# Patient Record
Sex: Female | Born: 1955 | Race: Black or African American | Hispanic: No | Smoking: Never smoker
Health system: Southern US, Community
[De-identification: ages and names within clinical notes are randomized; demographics above are authoritative.]

## PROBLEM LIST (undated history)

## (undated) DIAGNOSIS — N6012 Diffuse cystic mastopathy of left breast: Secondary | ICD-10-CM

## (undated) DIAGNOSIS — K219 Gastro-esophageal reflux disease without esophagitis: Secondary | ICD-10-CM

## (undated) DIAGNOSIS — N6011 Diffuse cystic mastopathy of right breast: Secondary | ICD-10-CM

## (undated) DIAGNOSIS — R748 Abnormal levels of other serum enzymes: Secondary | ICD-10-CM

## (undated) DIAGNOSIS — N951 Menopausal and female climacteric states: Secondary | ICD-10-CM

## (undated) DIAGNOSIS — D649 Anemia, unspecified: Secondary | ICD-10-CM

## (undated) DIAGNOSIS — R7303 Prediabetes: Secondary | ICD-10-CM

## (undated) DIAGNOSIS — M51369 Other intervertebral disc degeneration, lumbar region without mention of lumbar back pain or lower extremity pain: Secondary | ICD-10-CM

## (undated) DIAGNOSIS — J3489 Other specified disorders of nose and nasal sinuses: Secondary | ICD-10-CM

## (undated) DIAGNOSIS — I10 Essential (primary) hypertension: Secondary | ICD-10-CM

## (undated) DIAGNOSIS — G4700 Insomnia, unspecified: Secondary | ICD-10-CM

## (undated) DIAGNOSIS — G43909 Migraine, unspecified, not intractable, without status migrainosus: Secondary | ICD-10-CM

## (undated) DIAGNOSIS — D473 Essential (hemorrhagic) thrombocythemia: Secondary | ICD-10-CM

## (undated) DIAGNOSIS — M5136 Other intervertebral disc degeneration, lumbar region: Secondary | ICD-10-CM

## (undated) DIAGNOSIS — E785 Hyperlipidemia, unspecified: Secondary | ICD-10-CM

## (undated) DIAGNOSIS — M1990 Unspecified osteoarthritis, unspecified site: Secondary | ICD-10-CM

## (undated) DIAGNOSIS — Z972 Presence of dental prosthetic device (complete) (partial): Secondary | ICD-10-CM

## (undated) DIAGNOSIS — M479 Spondylosis, unspecified: Secondary | ICD-10-CM

## (undated) DIAGNOSIS — G4730 Sleep apnea, unspecified: Secondary | ICD-10-CM

## (undated) DIAGNOSIS — F339 Major depressive disorder, recurrent, unspecified: Secondary | ICD-10-CM

## (undated) DIAGNOSIS — I129 Hypertensive chronic kidney disease with stage 1 through stage 4 chronic kidney disease, or unspecified chronic kidney disease: Secondary | ICD-10-CM

## (undated) DIAGNOSIS — R7301 Impaired fasting glucose: Secondary | ICD-10-CM

## (undated) HISTORY — DX: Impaired fasting glucose: R73.01

## (undated) HISTORY — DX: Hyperlipidemia, unspecified: E78.5

## (undated) HISTORY — DX: Hypertensive chronic kidney disease with stage 1 through stage 4 chronic kidney disease, or unspecified chronic kidney disease: I12.9

## (undated) HISTORY — DX: Abnormal levels of other serum enzymes: R74.8

## (undated) HISTORY — DX: Spondylosis, unspecified: M47.9

## (undated) HISTORY — DX: Other intervertebral disc degeneration, lumbar region without mention of lumbar back pain or lower extremity pain: M51.369

## (undated) HISTORY — DX: Migraine, unspecified, not intractable, without status migrainosus: G43.909

## (undated) HISTORY — DX: Menopausal and female climacteric states: N95.1

## (undated) HISTORY — DX: Sleep apnea, unspecified: G47.30

## (undated) HISTORY — DX: Insomnia, unspecified: G47.00

## (undated) HISTORY — DX: Diffuse cystic mastopathy of left breast: N60.12

## (undated) HISTORY — PX: PITUITARY SURGERY: SHX203

## (undated) HISTORY — DX: Essential (hemorrhagic) thrombocythemia: D47.3

## (undated) HISTORY — DX: Diffuse cystic mastopathy of left breast: N60.11

## (undated) HISTORY — DX: Unspecified osteoarthritis, unspecified site: M19.90

## (undated) HISTORY — DX: Major depressive disorder, recurrent, unspecified: F33.9

## (undated) HISTORY — PX: ABDOMINAL HYSTERECTOMY: SHX81

## (undated) HISTORY — DX: Gastro-esophageal reflux disease without esophagitis: K21.9

## (undated) HISTORY — DX: Other intervertebral disc degeneration, lumbar region: M51.36

## (undated) HISTORY — DX: Other specified disorders of nose and nasal sinuses: J34.89

---

## 2001-05-13 HISTORY — PX: CHOLECYSTECTOMY: SHX55

## 2006-05-13 HISTORY — PX: COLONOSCOPY: SHX174

## 2006-08-12 ENCOUNTER — Ambulatory Visit: Payer: Self-pay | Admitting: Gastroenterology

## 2009-11-30 ENCOUNTER — Ambulatory Visit: Payer: Self-pay | Admitting: Family Medicine

## 2012-05-13 HISTORY — PX: KNEE SURGERY: SHX244

## 2012-07-27 ENCOUNTER — Ambulatory Visit: Payer: Self-pay | Admitting: Orthopedic Surgery

## 2013-05-13 HISTORY — PX: OTHER SURGICAL HISTORY: SHX169

## 2013-08-10 ENCOUNTER — Ambulatory Visit: Payer: Self-pay | Admitting: Family Medicine

## 2013-10-15 DIAGNOSIS — E220 Acromegaly and pituitary gigantism: Secondary | ICD-10-CM | POA: Insufficient documentation

## 2013-11-12 DIAGNOSIS — D352 Benign neoplasm of pituitary gland: Secondary | ICD-10-CM | POA: Insufficient documentation

## 2014-08-18 ENCOUNTER — Ambulatory Visit
Admit: 2014-08-18 | Disposition: A | Discharge status: Home / Self Care | Payer: Self-pay | Attending: Family Medicine | Admitting: Family Medicine

## 2014-08-22 ENCOUNTER — Encounter: Payer: Self-pay | Admitting: Family Medicine

## 2014-08-22 DIAGNOSIS — K219 Gastro-esophageal reflux disease without esophagitis: Secondary | ICD-10-CM | POA: Insufficient documentation

## 2014-08-22 DIAGNOSIS — I129 Hypertensive chronic kidney disease with stage 1 through stage 4 chronic kidney disease, or unspecified chronic kidney disease: Secondary | ICD-10-CM | POA: Insufficient documentation

## 2014-08-22 DIAGNOSIS — M1990 Unspecified osteoarthritis, unspecified site: Secondary | ICD-10-CM | POA: Insufficient documentation

## 2014-08-22 DIAGNOSIS — F339 Major depressive disorder, recurrent, unspecified: Secondary | ICD-10-CM | POA: Insufficient documentation

## 2014-08-22 DIAGNOSIS — G4730 Sleep apnea, unspecified: Secondary | ICD-10-CM | POA: Insufficient documentation

## 2014-08-22 DIAGNOSIS — G43909 Migraine, unspecified, not intractable, without status migrainosus: Secondary | ICD-10-CM | POA: Insufficient documentation

## 2014-08-22 DIAGNOSIS — E785 Hyperlipidemia, unspecified: Secondary | ICD-10-CM | POA: Insufficient documentation

## 2014-08-22 DIAGNOSIS — R7301 Impaired fasting glucose: Secondary | ICD-10-CM | POA: Insufficient documentation

## 2014-08-22 DIAGNOSIS — F329 Major depressive disorder, single episode, unspecified: Secondary | ICD-10-CM | POA: Insufficient documentation

## 2014-08-22 DIAGNOSIS — M5136 Other intervertebral disc degeneration, lumbar region: Secondary | ICD-10-CM | POA: Insufficient documentation

## 2014-08-22 DIAGNOSIS — F32A Depression, unspecified: Secondary | ICD-10-CM | POA: Insufficient documentation

## 2014-08-22 DIAGNOSIS — G4700 Insomnia, unspecified: Secondary | ICD-10-CM | POA: Insufficient documentation

## 2014-09-01 ENCOUNTER — Ambulatory Visit
Admit: 2014-09-01 | Disposition: A | Discharge status: Home / Self Care | Payer: Self-pay | Attending: Gastroenterology | Admitting: Gastroenterology

## 2014-11-16 ENCOUNTER — Encounter: Payer: Self-pay | Admitting: Family Medicine

## 2014-12-08 ENCOUNTER — Encounter: Payer: Self-pay | Admitting: Family Medicine

## 2015-02-16 ENCOUNTER — Encounter: Payer: Self-pay | Admitting: Family Medicine

## 2015-02-16 ENCOUNTER — Ambulatory Visit (INDEPENDENT_AMBULATORY_CARE_PROVIDER_SITE_OTHER): Payer: 59 | Admitting: Family Medicine

## 2015-02-16 VITALS — BP 137/88 | HR 71 | Temp 98.9°F | Ht 66.5 in | Wt 203.0 lb

## 2015-02-16 DIAGNOSIS — R7301 Impaired fasting glucose: Secondary | ICD-10-CM | POA: Diagnosis not present

## 2015-02-16 DIAGNOSIS — E669 Obesity, unspecified: Secondary | ICD-10-CM | POA: Diagnosis not present

## 2015-02-16 DIAGNOSIS — Z1239 Encounter for other screening for malignant neoplasm of breast: Secondary | ICD-10-CM

## 2015-02-16 DIAGNOSIS — Z1159 Encounter for screening for other viral diseases: Secondary | ICD-10-CM | POA: Diagnosis not present

## 2015-02-16 DIAGNOSIS — R05 Cough: Secondary | ICD-10-CM | POA: Diagnosis not present

## 2015-02-16 DIAGNOSIS — Z114 Encounter for screening for human immunodeficiency virus [HIV]: Secondary | ICD-10-CM

## 2015-02-16 DIAGNOSIS — I129 Hypertensive chronic kidney disease with stage 1 through stage 4 chronic kidney disease, or unspecified chronic kidney disease: Secondary | ICD-10-CM

## 2015-02-16 DIAGNOSIS — R059 Cough, unspecified: Secondary | ICD-10-CM

## 2015-02-16 DIAGNOSIS — M940 Chondrocostal junction syndrome [Tietze]: Secondary | ICD-10-CM | POA: Diagnosis not present

## 2015-02-16 DIAGNOSIS — Z0000 Encounter for general adult medical examination without abnormal findings: Secondary | ICD-10-CM | POA: Diagnosis not present

## 2015-02-16 MED ORDER — HYDROCHLOROTHIAZIDE 25 MG PO TABS: 25.0000 mg | ORAL_TABLET | Freq: Every day | ORAL | Status: DC

## 2015-02-16 NOTE — Patient Instructions (Addendum)
Stop the losartan / hctz combination pill Start plain hctz Monitor your blood pressure and call me if over 140-150 on top Try to follow the DASH guidelines  Check out the information at familydoctor.org entitled "What It Takes to Lose Weight" Try to lose between 1-2 pounds per week by taking in fewer calories and burning off more calories You can succeed by limiting portions, limiting foods dense in calories and fat, becoming more active, and drinking 8 glasses of water a day (64 ounces) Don't skip meals, especially breakfast, as skipping meals may alter your metabolism Do not use over-the-counter weight loss pills or gimmicks that claim rapid weight loss A healthy BMI (or body mass index) is between 18.5 and 24.9 You can calculate your ideal BMI at the [place] website [email or web address]  GINA--> please request colonoscopy report and any path report from [facility] from April 2016 and forward regarding colonoscopy and follow-up  Start 81 mg baby coated aspirin daily  Start 800 or 1000 iu vitamin D3 and take one a day on days of low sun exposure  We'll contact you about the lab results  Health Maintenance, Female Adopting a healthy lifestyle and getting preventive care can go a long way to promote health and wellness. Talk with your health care provider about what schedule of regular examinations is right for you. This is a good chance for you to check in with your provider about disease prevention and staying healthy. In between checkups, there are plenty of things you can do on your own. Experts have done a lot of research about which lifestyle changes and preventive measures are most likely to keep you healthy. Ask your health care provider for more information. WEIGHT AND DIET  Eat a healthy diet  Be sure to include plenty of vegetables, fruits, low-fat dairy products, and lean protein.  Do not eat a lot of foods high in solid fats, added  sugars, or salt.  Get regular exercise. This is one of the most important things you can do for your health.  Most adults should exercise for at least 150 minutes each week. The exercise should increase your heart rate and make you sweat (moderate-intensity exercise).  Most adults should also do strengthening exercises at least twice a week. This is in addition to the moderate-intensity exercise.  Maintain a healthy weight  Body mass index (BMI) is a measurement that can be used to identify possible weight problems. It estimates body fat based on height and weight. Your health care provider can help determine your BMI and help you achieve or maintain a healthy weight.  For females 95 years of age and older:   A BMI below 18.5 is considered underweight.  A BMI of 18.5 to 24.9 is normal.  A BMI of 25 to 29.9 is considered overweight.  A BMI of 30 and above is considered obese.  Watch levels of cholesterol and blood lipids  You should start having your blood tested for lipids and cholesterol at 60 years of age, then have this test every 5 years.  You may need to have your cholesterol levels checked more often if:  Your lipid or cholesterol levels are high.  You are older than 60 years of age.  You are at high risk for heart disease.  CANCER SCREENING   Lung Cancer  Lung cancer screening is recommended for adults 11-21 years old who are at high risk for lung cancer because of a history of smoking.  A yearly low-dose CT  scan of the lungs is recommended for people who:  Currently smoke.  Have quit within the past 15 years.  Have at least a 30-pack-year history of smoking. A pack year is smoking an average of one pack of cigarettes a day for 1 year.  Yearly screening should continue until it has been 15 years since you quit.  Yearly screening should stop if you develop a health problem that would prevent you from having lung cancer treatment.  Breast Cancer  Practice  breast self-awareness. This means understanding how your breasts normally appear and feel.  It also means doing regular breast self-exams. Let your health care provider know about any changes, no matter how small.  If you are in your 20s or 30s, you should have a clinical breast exam (CBE) by a health care provider every 1-3 years as part of a regular health exam.  If you are 79 or older, have a CBE every year. Also consider having a breast X-ray (mammogram) every year.  If you have a family history of breast cancer, talk to your health care provider about genetic screening.  If you are at high risk for breast cancer, talk to your health care provider about having an MRI and a mammogram every year.  Breast cancer gene (BRCA) assessment is recommended for women who have family members with BRCA-related cancers. BRCA-related cancers include:  Breast.  Ovarian.  Tubal.  Peritoneal cancers.  Results of the assessment will determine the need for genetic counseling and BRCA1 and BRCA2 testing. Cervical Cancer Your health care provider may recommend that you be screened regularly for cancer of the pelvic organs (ovaries, uterus, and vagina). This screening involves a pelvic examination, including checking for microscopic changes to the surface of your cervix (Pap test). You may be encouraged to have this screening done every 3 years, beginning at age 31.  For women ages 70-65, health care providers may recommend pelvic exams and Pap testing every 3 years, or they may recommend the Pap and pelvic exam, combined with testing for human papilloma virus (HPV), every 5 years. Some types of HPV increase your risk of cervical cancer. Testing for HPV may also be done on women of any age with unclear Pap test results.  Other health care providers may not recommend any screening for nonpregnant women who are considered low risk for pelvic cancer and who do not have symptoms. Ask your health care provider  if a screening pelvic exam is right for you.  If you have had past treatment for cervical cancer or a condition that could lead to cancer, you need Pap tests and screening for cancer for at least 20 years after your treatment. If Pap tests have been discontinued, your risk factors (such as having a new sexual partner) need to be reassessed to determine if screening should resume. Some women have medical problems that increase the chance of getting cervical cancer. In these cases, your health care provider may recommend more frequent screening and Pap tests. Colorectal Cancer  This type of cancer can be detected and often prevented.  Routine colorectal cancer screening usually begins at 60 years of age and continues through 60 years of age.  Your health care provider may recommend screening at an earlier age if you have risk factors for colon cancer.  Your health care provider may also recommend using home test kits to check for hidden blood in the stool.  A small camera at the end of a tube can be used  to examine your colon directly (sigmoidoscopy or colonoscopy). This is done to check for the earliest forms of colorectal cancer.  Routine screening usually begins at age 55.  Direct examination of the colon should be repeated every 5-10 years through 60 years of age. However, you may need to be screened more often if early forms of precancerous polyps or small growths are found. Skin Cancer  Check your skin from head to toe regularly.  Tell your health care provider about any new moles or changes in moles, especially if there is a change in a mole's shape or color.  Also tell your health care provider if you have a mole that is larger than the size of a pencil eraser.  Always use sunscreen. Apply sunscreen liberally and repeatedly throughout the day.  Protect yourself by wearing long sleeves, pants, a wide-brimmed hat, and sunglasses whenever you are outside. HEART DISEASE, DIABETES, AND  HIGH BLOOD PRESSURE   High blood pressure causes heart disease and increases the risk of stroke. High blood pressure is more likely to develop in:  People who have blood pressure in the high end of the normal range (130-139/85-89 mm Hg).  People who are overweight or obese.  People who are African American.  If you are 67-70 years of age, have your blood pressure checked every 3-5 years. If you are 105 years of age or older, have your blood pressure checked every year. You should have your blood pressure measured twice--once when you are at a hospital or clinic, and once when you are not at a hospital or clinic. Record the average of the two measurements. To check your blood pressure when you are not at a hospital or clinic, you can use:  An automated blood pressure machine at a pharmacy.  A home blood pressure monitor.  If you are between 90 years and 77 years old, ask your health care provider if you should take aspirin to prevent strokes.  Have regular diabetes screenings. This involves taking a blood sample to check your fasting blood sugar level.  If you are at a normal weight and have a low risk for diabetes, have this test once every three years after 60 years of age.  If you are overweight and have a high risk for diabetes, consider being tested at a younger age or more often. PREVENTING INFECTION  Hepatitis B  If you have a higher risk for hepatitis B, you should be screened for this virus. You are considered at high risk for hepatitis B if:  You were born in a country where hepatitis B is common. Ask your health care provider which countries are considered high risk.  Your parents were born in a high-risk country, and you have not been immunized against hepatitis B (hepatitis B vaccine).  You have HIV or AIDS.  You use needles to inject street drugs.  You live with someone who has hepatitis B.  You have had sex with someone who has hepatitis B.  You get hemodialysis  treatment.  You take certain medicines for conditions, including cancer, organ transplantation, and autoimmune conditions. Hepatitis C  Blood testing is recommended for:  Everyone born from [birth year] through 1966.  Anyone with known risk factors for hepatitis C. Sexually transmitted infections (STIs)  You should be screened for sexually transmitted infections (STIs) including gonorrhea and chlamydia if:  You are sexually active and are younger than 60 years of age.  You are older than 60 years of age and your  health care provider tells you that you are at risk for this type of infection.  Your sexual activity has changed since you were last screened and you are at an increased risk for chlamydia or gonorrhea. Ask your health care provider if you are at risk.  If you do not have HIV, but are at risk, it may be recommended that you take a prescription medicine daily to prevent HIV infection. This is called pre-exposure prophylaxis (PrEP). You are considered at risk if:  You are sexually active and do not regularly use condoms or know the HIV status of your partner(s).  You take drugs by injection.  You are sexually active with a partner who has HIV. Talk with your health care provider about whether you are at high risk of being infected with HIV. If you choose to begin PrEP, you should first be tested for HIV. You should then be tested every 3 months for as long as you are taking PrEP.  PREGNANCY   If you are premenopausal and you may become pregnant, ask your health care provider about preconception counseling.  If you may become pregnant, take 400 to 800 micrograms (mcg) of folic acid every day.  If you want to prevent pregnancy, talk to your health care provider about birth control (contraception). OSTEOPOROSIS AND MENOPAUSE   Osteoporosis is a disease in which the bones lose minerals and strength with aging. This can result in serious bone fractures. Your risk for osteoporosis can  be identified using a bone density scan.  If you are 58 years of age or older, or if you are at risk for osteoporosis and fractures, ask your health care provider if you should be screened.  Ask your health care provider whether you should take a calcium or vitamin D supplement to lower your risk for osteoporosis.  Menopause may have certain physical symptoms and risks.  Hormone replacement therapy may reduce some of these symptoms and risks. Talk to your health care provider about whether hormone replacement therapy is right for you.  HOME CARE INSTRUCTIONS   Schedule regular health, dental, and eye exams.  Stay current with your immunizations.   Do not use any tobacco products including cigarettes, chewing tobacco, or electronic cigarettes.  If you are pregnant, do not drink alcohol.  If you are breastfeeding, limit how much and how often you drink alcohol.  Limit alcohol intake to no more than 1 drink per day for nonpregnant women. One drink equals 12 ounces of beer, 5 ounces of wine, or 1 ounces of hard liquor.  Do not use street drugs.  Do not share needles.  Ask your health care provider for help if you need support or information about quitting drugs.  Tell your health care provider if you often feel depressed.  Tell your health care provider if you have ever been abused or do not feel safe at home.   This information is not intended to replace advice given to you by your health care provider. Make sure you discuss any questions you have with your health care provider.   Document Released: 11/12/2010 Document Revised: 05/20/2014 Document Reviewed: 03/31/2013 Elsevier Interactive Patient Education 2016 [employer] Costochondritis Costochondritis, sometimes called Tietze syndrome, is a swelling and irritation (inflammation) of the tissue (cartilage) that connects your ribs with your breastbone (sternum). It causes pain in the chest and rib area. Costochondritis  usually goes away on its own over time. It can take up to 6 weeks or longer to get  better, especially if you are unable to limit your activities. CAUSES  Some cases of costochondritis have no known cause. Possible causes include:  Injury (trauma).  Exercise or activity such as lifting.  Severe coughing. SIGNS AND SYMPTOMS  Pain and tenderness in the chest and rib area.  Pain that gets worse when coughing or taking deep breaths.  Pain that gets worse with specific movements. DIAGNOSIS  Your health care provider will do a physical exam and ask about your symptoms. Chest X-rays or other tests may be done to rule out other problems. TREATMENT  Costochondritis usually goes away on its own over time. Your health care provider may prescribe medicine to help relieve pain. HOME CARE INSTRUCTIONS   Avoid exhausting physical activity. Try not to strain your ribs during normal activity. This would include any activities using chest, abdominal, and side muscles, especially if heavy weights are used.  Apply ice to the affected area for the first 2 days after the pain begins.  Put ice in a plastic bag.  Place a towel between your skin and the bag.  Leave the ice on for 20 minutes, 2-3 times a day.  Only take over-the-counter or prescription medicines as directed by your health care provider. SEEK MEDICAL CARE IF:  You have redness or swelling at the rib joints. These are signs of infection.  Your pain does not go away despite rest or medicine. SEEK IMMEDIATE MEDICAL CARE IF:   Your pain increases or you are very uncomfortable.  You have shortness of breath or difficulty breathing.  You cough up blood.  You have worse chest pains, sweating, or vomiting.  You have a fever or persistent symptoms for more than 2-3 days.  You have a fever and your symptoms suddenly get worse. MAKE SURE YOU:   Understand these instructions.  Will watch your condition.  Will get help right away if  you are not doing well or get worse.   This information is not intended to replace advice given to you by your health care provider. Make sure you discuss any questions you have with your health care provider.   Document Released: 02/06/2005 Document Revised: 02/17/2013 Document Reviewed: 12/01/2012 Elsevier Interactive Patient Education [employer].

## 2015-02-16 NOTE — Assessment & Plan Note (Addendum)
On metformin per endocrinologist; encouraged weight loss

## 2015-02-16 NOTE — Progress Notes (Signed)
Patient ID: [name], female   DOB: 1956-01-22, 59 y.o.   MRN: [id number]   Subjective:   [name] is a 59 y.o. female here for a complete physical exam  Interim issues since last visit: no surgeries, no ER, no broken bones  USPSTF grade a and b recommendations Alcohol: very seldom Depression screen PHQ 2/9 02/16/2015  Decreased Interest 0  Down, Depressed, Hopeless 0  PHQ - 2 Score 0  HTN: well-controlled, goes up even at the doctor; usually in the good numbers; she wants to change BP med; she would skip a few days and the cough would stop; felt chest pain, fleeting middle of the chest; no nausea, no SHOB Obesity: lost 6 pounds, working on getting down to 180 pounds Tobacco: around 2nd hand smoke; no personal smoking hx HIV, Hep B, Hep C: test today Chol: today Glucose: today Abuse: no BRCA: n/a Breast: one grandmother; does mammo every 2 years Cervix: n/a Colorectal cancer: UTD, April 2016 Osteoporosis: no steroids; no early fam hx of osteoporosis; going to see doctor at [facility] Aspirin: not taking, but will start that Diet: some vegetables, salads Exercise: will start walking  Past Medical History  Diagnosis Date  . Fibrocystic disease of both breasts   . GERD (gastroesophageal reflux disease)   . Osteoarthritis   . Perforation of nasal septum   . Benign hypertensive renal disease   . Hyperlipidemia   . DDD (degenerative disc disease), lumbar   . Insomnia   . Impaired fasting glucose   . Recurrent major depression ([place])   . Menopausal state   . Migraine   . 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 Elevated alkaline phosphatase level   . Sleep apnea     Does not use CPAP  . Spondylosis    Past Surgical History  Procedure Laterality Date  . Pituitary surgery      x2 30+ years ago  . Cholecystectomy  2003  . Abdominal hysterectomy      partial with bladder suspension, no cervix  . Knee surgery Right 2014  . Transnasal approach pituitary adenoma  resection  2015    clival/parasellar mass  . Colonoscopy  2008    1 polyp- return 10 years   Family History  Problem Relation Age of Onset  . Diabetes Mother   . Hypertension Mother   . Cancer Mother     possible liver  . Heart disease Father   . Hypertension Father   . Heart attack Father   . Alzheimer's disease Father   . Diabetes Sister   . Hypertension Sister   . Cancer Sister     lung?  . Stroke Sister   . Atrial fibrillation Sister    Social History  Substance Use Topics  . Smoking status: Never Smoker   . Smokeless tobacco: Never Used  . Alcohol Use: No     Comment: rare   Review of Systems  Constitutional: Negative for unexpected weight change.  HENT: Positive for ear pain (left side, clicks with heart beat). Negative for hearing loss and nosebleeds.   Eyes: Negative for visual disturbance.  Respiratory: Positive for cough (thought to be 2nd to ARB; no hemoptysis, no sputum).   Cardiovascular: Positive for chest pain (with cough central).  Gastrointestinal: Negative for blood in stool.  Endocrine: Negative for cold intolerance, heat intolerance, polydipsia and polyuria.  Genitourinary: Negative for hematuria.  Musculoskeletal: Positive for arthralgias.  Allergic/Immunologic: Negative for food allergies.  Neurological: Negative  for tremors.  Hematological: Positive for adenopathy (some neck lymph nodes, better now). Does not bruise/bleed easily.  Psychiatric/Behavioral: Negative for dysphoric mood and decreased concentration.   Objective:   Filed Vitals:   02/16/15 1406  BP: 137/88  Pulse: 71  Temp: 98.9 F (37.2 C)  Height: 5' 6.5" (1.689 m)  Weight: 203 lb (92.08 kg)  SpO2: 98%   Body mass index is 32.28 kg/(m^2). Wt Readings from Last 3 Encounters:  02/16/15 203 lb (92.08 kg)  08/18/14 209 lb (94.802 kg)   Physical Exam  Constitutional: She appears well-developed and well-nourished.  HENT:  Head: Normocephalic and atraumatic.  Right Ear:  Hearing, tympanic membrane, external ear and ear canal normal.  Left Ear: Hearing, tympanic membrane, external ear and ear canal normal.  Eyes: Conjunctivae and EOM are normal. Right eye exhibits no hordeolum. Left eye exhibits no hordeolum. No scleral icterus.  Neck: Carotid bruit is not present. No thyromegaly present.  Cardiovascular: Normal rate, regular rhythm, S1 normal, S2 normal and normal heart sounds.   No extrasystoles are present.  Pulmonary/Chest: Effort normal and breath sounds normal. No respiratory distress. She exhibits tenderness (tender over anterior costochondral junctions at several levels). She exhibits no crepitus, no edema and no deformity. Right breast exhibits no inverted nipple, no mass, no nipple discharge, no skin change and no tenderness. Left breast exhibits no inverted nipple, no mass, no nipple discharge, no skin change and no tenderness. Breasts are symmetrical.  Abdominal: Soft. Normal appearance and bowel sounds are normal. She exhibits no distension, no abdominal bruit, no pulsatile midline mass and no mass. There is no hepatosplenomegaly. There is no tenderness. No hernia.  Musculoskeletal: Normal range of motion. She exhibits no edema.  Lymphadenopathy:       Head (right side): No submandibular adenopathy present.       Head (left side): No submandibular adenopathy present.    She has no cervical adenopathy.    She has no axillary adenopathy.  Neurological: She is alert. She displays no tremor. No cranial nerve deficit. She exhibits normal muscle tone. Gait normal.  Reflex Scores:      Patellar reflexes are 2+ on the right side and 2+ on the left side. Skin: Skin is warm and dry. No bruising and no ecchymosis noted. No cyanosis. No pallor.  Psychiatric: Her speech is normal and behavior is normal. Thought content normal. Her mood appears not anxious. She does not exhibit a depressed mood.   Assessment/Plan:   Problem List Items Addressed This Visit       Endocrine   Impaired fasting glucose    On metformin per endocrinologist; encouraged weight loss        Genitourinary   Benign hypertensive renal disease    Stop the ARB because of possible side effect (cough) and use plain thiazide; DASH guidelines; work on modest weight loss        Other   Breast cancer screening    Mammograms every 1-2 years; CBE done today; recommended monthly SBE      Relevant Orders   MM DIGITAL SCREENING BILATERAL   Preventative health care - Primary    USPSTF grade A and B recommendations reviewed with patient; age-appropriate recommendations, preventive care, screening tests, etc discussed and encouraged; healthy living encouraged; see AVS for patient education given to patient; requesting colonoscopy and path reports to see when next colonoscopy is due      Relevant Orders   CBC with Differential/Platelet (Completed)   Lipid Panel  w/o Chol/HDL Ratio (Completed)   Comprehensive metabolic panel (Completed)   Obesity    Encouraged weight loss; see AVS for information given to patient      Relevant Medications   metFORMIN (GLUCOPHAGE) 500 MG tablet    Other Visit Diagnoses    Costochondritis        explained dx; see AVS for info given to patient    Cough        may be side effect of the ARB (not nearly as common as with ACE-I, but that was her reaction to the ACE-I); if not improving, contact me    Screening for HIV (human immunodeficiency virus)        one time screening recommendation discussed, patient agrees, ordered    Relevant Orders    HIV antibody (Completed)    Need for hepatitis C screening test        one time screening recommendation discussed, patient agrees, ordered    Relevant Orders    Hepatitis C antibody (Completed)    Need for hepatitis B screening test        one time screening recommendation discussed, patient agrees, ordered    Relevant Orders    Hepatitis B surface antigen (Completed)       Meds ordered this encounter   Medications  . metFORMIN (GLUCOPHAGE) 500 MG tablet    Sig: Take 500 mg by mouth 2 (two) times daily.    Refill:  3  . hydrochlorothiazide (HYDRODIURIL) 25 MG tablet    Sig: Take 1 tablet (25 mg total) by mouth daily.    Dispense:  30 tablet    Refill:  5    CANCEL the losartan/hctz   Orders Placed This Encounter  Procedures  . MM DIGITAL SCREENING BILATERAL  . Hepatitis B surface antigen  . Hepatitis C antibody  . HIV antibody  . CBC with Differential/Platelet  . Lipid Panel w/o Chol/HDL Ratio  . Comprehensive metabolic panel   Follow up plan: Return in about 1 year (around 02/16/2016) for next physical. An after-visit summary was printed and given to the patient at [facility].  Please see the patient instructions which may contain other information and recommendations beyond what is mentioned above in the assessment and plan.

## 2015-02-17 LAB — CBC WITH DIFFERENTIAL/PLATELET
Basophils Absolute: 0 10*3/uL (ref 0.0–0.2)
Basos: 0 %
EOS (ABSOLUTE): 0.1 10*3/uL (ref 0.0–0.4)
Eos: 1 %
Hematocrit: 36.4 % (ref 34.0–46.6)
Hemoglobin: 11.3 g/dL (ref 11.1–15.9)
Immature Grans (Abs): 0 10*3/uL (ref 0.0–0.1)
Immature Granulocytes: 0 %
Lymphocytes Absolute: 1.6 10*3/uL (ref 0.7–3.1)
Lymphs: 29 %
MCH: 26.6 pg (ref 26.6–33.0)
MCHC: 31 g/dL — ABNORMAL LOW (ref 31.5–35.7)
MCV: 86 fL (ref 79–97)
Monocytes Absolute: 0.5 10*3/uL (ref 0.1–0.9)
Monocytes: 10 %
Neutrophils Absolute: 3.3 10*3/uL (ref 1.4–7.0)
Neutrophils: 60 %
Platelets: 398 10*3/uL — ABNORMAL HIGH (ref 150–379)
RBC: 4.25 x10E6/uL (ref 3.77–5.28)
RDW: 15.8 % — ABNORMAL HIGH (ref 12.3–15.4)
WBC: 5.5 10*3/uL (ref 3.4–10.8)

## 2015-02-17 LAB — COMPREHENSIVE METABOLIC PANEL
ALT: 16 IU/L (ref 0–32)
AST: 16 IU/L (ref 0–40)
Albumin/Globulin Ratio: 1.3 (ref 1.1–2.5)
Albumin: 3.9 g/dL (ref 3.6–4.8)
Alkaline Phosphatase: 126 IU/L — ABNORMAL HIGH (ref 39–117)
BUN/Creatinine Ratio: 12 (ref 11–26)
BUN: 11 mg/dL (ref 8–27)
Bilirubin Total: 0.3 mg/dL (ref 0.0–1.2)
CO2: 26 mmol/L (ref 18–29)
Calcium: 9.4 mg/dL (ref 8.7–10.3)
Chloride: 103 mmol/L (ref 97–108)
Creatinine, Ser: 0.95 mg/dL (ref 0.57–1.00)
GFR calc Af Amer: 75 mL/min/{1.73_m2} (ref >59)
GFR calc non Af Amer: 65 mL/min/{1.73_m2} (ref >59)
Globulin, Total: 2.9 g/dL (ref 1.5–4.5)
Glucose: 95 mg/dL (ref 65–99)
Potassium: 4.3 mmol/L (ref 3.5–5.2)
Sodium: 142 mmol/L (ref 134–144)
Total Protein: 6.8 g/dL (ref 6.0–8.5)

## 2015-02-17 LAB — LIPID PANEL W/O CHOL/HDL RATIO
Cholesterol, Total: 178 mg/dL (ref 100–199)
HDL: 49 mg/dL (ref >39)
LDL Calculated: 114 mg/dL — ABNORMAL HIGH (ref 0–99)
Triglycerides: 75 mg/dL (ref 0–149)
VLDL Cholesterol Cal: 15 mg/dL (ref 5–40)

## 2015-02-17 LAB — HEPATITIS B SURFACE ANTIGEN: Hepatitis B Surface Ag: NEGATIVE

## 2015-02-17 LAB — HIV ANTIBODY (ROUTINE TESTING W REFLEX): HIV Screen 4th Generation wRfx: NONREACTIVE

## 2015-02-17 LAB — HEPATITIS C ANTIBODY: Hep C Virus Ab: <0.1 s/co ratio (ref 0.0–0.9)

## 2015-02-21 DIAGNOSIS — E669 Obesity, unspecified: Secondary | ICD-10-CM

## 2015-02-21 DIAGNOSIS — Z0000 Encounter for general adult medical examination without abnormal findings: Principal | ICD-10-CM

## 2015-02-21 NOTE — Assessment & Plan Note (Signed)
USPSTF grade A and B recommendations reviewed with patient; age-appropriate recommendations, preventive care, screening tests, etc discussed and encouraged; healthy living encouraged; see AVS for patient education given to patient; requesting colonoscopy and path reports to see when next colonoscopy is due

## 2015-02-21 NOTE — Assessment & Plan Note (Signed)
Encouraged weight loss; see AVS for information given to patient

## 2015-02-21 NOTE — Assessment & Plan Note (Signed)
Mammograms every 1-2 years; CBE done today; recommended monthly SBE

## 2015-02-21 NOTE — Assessment & Plan Note (Signed)
Stop the ARB because of possible side effect (cough) and use plain thiazide; DASH guidelines; work on modest weight loss

## 2015-02-23 ENCOUNTER — Ambulatory Visit
Admission: RE | Admit: 2015-02-23 | Discharge: 2015-02-23 | Disposition: A | Discharge status: Home / Self Care | Payer: 59 | Source: Ambulatory Visit | Attending: Family Medicine | Admitting: Family Medicine

## 2015-02-23 DIAGNOSIS — Z1231 Encounter for screening mammogram for malignant neoplasm of breast: Secondary | ICD-10-CM | POA: Insufficient documentation

## 2015-02-23 DIAGNOSIS — Z1239 Encounter for other screening for malignant neoplasm of breast: Secondary | ICD-10-CM

## 2015-02-27 ENCOUNTER — Telehealth: Payer: Self-pay | Admitting: Family Medicine

## 2015-02-27 NOTE — Telephone Encounter (Signed)
No voicemail set up; could not leave msg

## 2015-03-07 ENCOUNTER — Telehealth: Payer: Self-pay

## 2015-03-07 NOTE — Telephone Encounter (Signed)
She is still having a problem with her right ear, she is still having slight pain and thinks something needs to be called in. Also she got her mammo report and it said that she had dense breasts that may obscure small masses and she wanted clarification on that.

## 2015-03-08 MED ORDER — FLUCONAZOLE 150 MG PO TABS: 150.0000 mg | ORAL_TABLET | Freq: Once | ORAL | Status: DC

## 2015-03-08 MED ORDER — ANTIPYRINE-BENZOCAINE 5.4-1.4 % OT SOLN: 3.0000 [drp] | OTIC | Status: DC | PRN

## 2015-03-08 MED ORDER — AMOXICILLIN 875 MG PO TABS: 875.0000 mg | ORAL_TABLET | Freq: Two times a day (BID) | ORAL | Status: DC

## 2015-03-08 NOTE — Telephone Encounter (Signed)
Patient notified, she also states she missed a call from you in regards to her lab results. While I was on the phone with her, I was notified that the pharmacy was on a different line and that the drops are not covered, they want to know if you can change to something else.

## 2015-03-08 NOTE — Telephone Encounter (Signed)
See other phone note; I talked to patient

## 2015-03-08 NOTE — Telephone Encounter (Signed)
I spoke with the patient; she says she has real sharp pains on the right, clicking; not sure if sinus infection; pressure in the nose; no fevers; pressure behind the cheek and eye; blowing out some stuff and little bit of blood; she has had these before and thinks it is a sinus infection Rx for amoxicillin; she gets yeast infections sometimes too; Rx sent dicsussed lab results

## 2015-03-08 NOTE — Telephone Encounter (Signed)
I sent in new Rx for drops which hopefully will help; if problem persists or worsens over next week, call back and we'll refer her to ENT Let her know dense breasts are common, simply means that mammograms are not quite as sensitive in picking up very small changes in tissue when breasts are dense versus when they are really fatty

## 2015-03-28 ENCOUNTER — Ambulatory Visit (INDEPENDENT_AMBULATORY_CARE_PROVIDER_SITE_OTHER): Payer: 59 | Admitting: Family Medicine

## 2015-03-28 ENCOUNTER — Encounter: Payer: Self-pay | Admitting: Family Medicine

## 2015-03-28 VITALS — BP 161/90 | HR 73 | Temp 98.7°F | Ht 66.3 in | Wt 205.0 lb

## 2015-03-28 DIAGNOSIS — Z23 Encounter for immunization: Secondary | ICD-10-CM

## 2015-03-28 DIAGNOSIS — T148 Other injury of unspecified body region: Secondary | ICD-10-CM

## 2015-03-28 DIAGNOSIS — W57XXXA Bitten or stung by nonvenomous insect and other nonvenomous arthropods, initial encounter: Secondary | ICD-10-CM | POA: Insufficient documentation

## 2015-03-28 MED ORDER — CEPHALEXIN 500 MG PO CAPS: 500.0000 mg | ORAL_CAPSULE | Freq: Three times a day (TID) | ORAL | Status: DC

## 2015-03-28 NOTE — Assessment & Plan Note (Signed)
Patient with allergic reaction and secondary cellulitis Discussed extensively care and treatment Gave printed information on treatment Patient was exposed in a motel room but discussed prevention of outbreaks at home and gave printed material. We'll give tetanus also excoriated areas

## 2015-03-28 NOTE — Progress Notes (Signed)
BP 161/90 mmHg  Pulse 73  Temp(Src) 98.7 F (37.1 C)  Ht 5' 6.3" (1.684 m)  Wt 205 lb (92.987 kg)  BMI 32.79 kg/m2  SpO2 99%   Subjective:    Patient ID: [name], female    DOB: 11-27-1955, 59 y.o.   MRN: [id number]  HPI: [name] is a 59 y.o. female  Chief Complaint  Patient presents with  . bed bug bites on face   Patient was placed on prednisone from walk-in clinic Still has marked swelling of her ear and redness in bite places around her right ear and scalp with secondary infection Patient doing some better on prednisone Patient still somewhat feverish but with no nausea vomiting or diarrhea. Family member who is with her with no infection symptoms are allergic reaction  Relevant past medical, surgical, family and social history reviewed and updated as indicated. Interim medical history since our last visit reviewed. Allergies and medications reviewed and updated.  Review of Systems  Constitutional: Negative.   HENT: Positive for facial swelling.   Respiratory: Negative.        Doing some coughing  Cardiovascular: Negative.     Per HPI unless specifically indicated above     Objective:    BP 161/90 mmHg  Pulse 73  Temp(Src) 98.7 F (37.1 C)  Ht 5' 6.3" (1.684 m)  Wt 205 lb (92.987 kg)  BMI 32.79 kg/m2  SpO2 99%  Wt Readings from Last 3 Encounters:  03/28/15 205 lb (92.987 kg)  02/16/15 203 lb (92.08 kg)  08/18/14 209 lb (94.802 kg)    Physical Exam  Constitutional: She is oriented to person, place, and time. She appears well-developed and well-nourished. No distress.  HENT:  Head: Normocephalic and atraumatic.  Right Ear: Hearing normal.  Left Ear: Hearing normal.  Nose: Nose normal.  Eyes: Conjunctivae and lids are normal. Right eye exhibits no discharge. Left eye exhibits no discharge. No scleral icterus.  Cardiovascular: Normal rate, regular rhythm and normal heart sounds.   Pulmonary/Chest: Effort normal and breath sounds  normal. No respiratory distress.  Musculoskeletal: Normal range of motion.  Neurological: She is alert and oriented to person, place, and time.  Skin: Skin is intact. No rash noted.  Inflamed area of right ear with swelling in several areas on scalp with secondary infection  Psychiatric: She has a normal mood and affect. Her speech is normal and behavior is normal. Judgment and thought content normal. Cognition and memory are normal.    Results for orders placed or performed in visit on 02/16/15  Hepatitis B surface antigen  Result Value Ref Range   Hepatitis B Surface Ag Negative Negative  Hepatitis C antibody  Result Value Ref Range   Hep C Virus Ab <0.1 0.0 - 0.9 s/co ratio  HIV antibody  Result Value Ref Range   HIV Screen 4th Generation wRfx Non Reactive Non Reactive  CBC with Differential/Platelet  Result Value Ref Range   WBC 5.5 3.4 - 10.8 x10E3/uL   RBC 4.25 3.77 - 5.28 x10E6/uL   Hemoglobin 11.3 11.1 - 15.9 g/dL   Hematocrit 36.4 34.0 - 46.6 %   MCV 86 79 - 97 fL   MCH 26.6 26.6 - 33.0 pg   MCHC 31.0 (L) 31.5 - 35.7 g/dL   RDW 15.8 (H) 12.3 - 15.4 %   Platelets 398 (H) 150 - 379 x10E3/uL   Neutrophils 60 %   Lymphs 29 %   Monocytes 10 %  Eos 1 %   Basos 0 %   Neutrophils Absolute 3.3 1.4 - 7.0 x10E3/uL   Lymphocytes Absolute 1.6 0.7 - 3.1 x10E3/uL   Monocytes Absolute 0.5 0.1 - 0.9 x10E3/uL   EOS (ABSOLUTE) 0.1 0.0 - 0.4 x10E3/uL   Basophils Absolute 0.0 0.0 - 0.2 x10E3/uL   Immature Granulocytes 0 %   Immature Grans (Abs) 0.0 0.0 - 0.1 x10E3/uL  Lipid Panel w/o Chol/HDL Ratio  Result Value Ref Range   Cholesterol, Total 178 100 - 199 mg/dL   Triglycerides 75 0 - 149 mg/dL   HDL 49 >39 mg/dL   VLDL Cholesterol Cal 15 5 - 40 mg/dL   LDL Calculated 114 (H) 0 - 99 mg/dL  Comprehensive metabolic panel  Result Value Ref Range   Glucose 95 65 - 99 mg/dL   BUN 11 8 - 27 mg/dL   Creatinine, Ser 0.95 0.57 - 1.00 mg/dL   GFR calc non Af Amer 65 >59 mL/min/1.73    GFR calc Af Amer 75 >59 mL/min/1.73   BUN/Creatinine Ratio 12 11 - 26   Sodium 142 134 - 144 mmol/L   Potassium 4.3 3.5 - 5.2 mmol/L   Chloride 103 97 - 108 mmol/L   CO2 26 18 - 29 mmol/L   Calcium 9.4 8.7 - 10.3 mg/dL   Total Protein 6.8 6.0 - 8.5 g/dL   Albumin 3.9 3.6 - 4.8 g/dL   Globulin, Total 2.9 1.5 - 4.5 g/dL   Albumin/Globulin Ratio 1.3 1.1 - 2.5   Bilirubin Total 0.3 0.0 - 1.2 mg/dL   Alkaline Phosphatase 126 (H) 39 - 117 IU/L   AST 16 0 - 40 IU/L   ALT 16 0 - 32 IU/L      Assessment & Plan:   Problem List Items Addressed This Visit      Other   Bed bug bite    Patient with allergic reaction and secondary cellulitis Discussed extensively care and treatment Gave printed information on treatment Patient was exposed in a motel room but discussed prevention of outbreaks at home and gave printed material. We'll give tetanus also excoriated areas      Relevant Medications   methylPREDNISolone (MEDROL DOSEPAK) 4 MG TBPK tablet   triamcinolone acetonide 40 MG/ML SUSP 40 mg, mupirocin cream 2 % CREA 15 g   cephALEXin (KEFLEX) 500 MG capsule   Other Relevant Orders   Tdap vaccine greater than or equal to 7yo IM (Completed)    Other Visit Diagnoses    Immunization due    -  Primary    Relevant Orders    Tdap vaccine greater than or equal to 7yo IM (Completed)        Follow up plan: Return for As scheduled.

## 2015-04-03 ENCOUNTER — Encounter: Payer: Self-pay | Admitting: Family Medicine

## 2015-04-03 ENCOUNTER — Other Ambulatory Visit: Payer: Self-pay | Admitting: Family Medicine

## 2015-04-03 ENCOUNTER — Telehealth: Payer: Self-pay | Admitting: Family Medicine

## 2015-04-03 NOTE — Telephone Encounter (Signed)
Pt called and stated that she needed a note with the diagnosis on it to send to the hotel to let them know what was going on with her face before they will take ownership of the problem.

## 2015-04-03 NOTE — Telephone Encounter (Signed)
Letter done

## 2015-04-03 NOTE — Telephone Encounter (Signed)
Can you write a letter stating Bed Bug Dx for patient to send to hotel

## 2015-05-31 ENCOUNTER — Ambulatory Visit: Payer: 59 | Admitting: Family Medicine

## 2015-08-21 ENCOUNTER — Other Ambulatory Visit: Payer: Self-pay

## 2015-08-21 MED ORDER — HYDROCHLOROTHIAZIDE 25 MG PO TABS: 25.0000 mg | ORAL_TABLET | Freq: Every day | ORAL | Status: DC

## 2015-08-21 NOTE — Telephone Encounter (Signed)
Routing to provider. She is following you to [facility]. 

## 2015-08-21 NOTE — Telephone Encounter (Signed)
Rx approved; last Cr reviewed

## 2015-08-25 ENCOUNTER — Ambulatory Visit (INDEPENDENT_AMBULATORY_CARE_PROVIDER_SITE_OTHER): Payer: 59 | Admitting: Family Medicine

## 2015-08-25 ENCOUNTER — Encounter: Payer: Self-pay | Admitting: Family Medicine

## 2015-08-25 ENCOUNTER — Telehealth: Payer: Self-pay

## 2015-08-25 VITALS — BP 132/84 | HR 66 | Temp 97.8°F | Resp 16 | Wt 212.0 lb

## 2015-08-25 DIAGNOSIS — G4700 Insomnia, unspecified: Secondary | ICD-10-CM | POA: Diagnosis not present

## 2015-08-25 DIAGNOSIS — R42 Dizziness and giddiness: Secondary | ICD-10-CM

## 2015-08-25 DIAGNOSIS — M545 Low back pain, unspecified: Secondary | ICD-10-CM

## 2015-08-25 DIAGNOSIS — H6982 Other specified disorders of Eustachian tube, left ear: Secondary | ICD-10-CM | POA: Diagnosis not present

## 2015-08-25 DIAGNOSIS — M5136 Other intervertebral disc degeneration, lumbar region: Secondary | ICD-10-CM | POA: Diagnosis not present

## 2015-08-25 DIAGNOSIS — J0100 Acute maxillary sinusitis, unspecified: Secondary | ICD-10-CM

## 2015-08-25 DIAGNOSIS — J3489 Other specified disorders of nose and nasal sinuses: Secondary | ICD-10-CM

## 2015-08-25 MED ORDER — FLUCONAZOLE 150 MG PO TABS: 150.0000 mg | ORAL_TABLET | Freq: Once | ORAL | Status: DC

## 2015-08-25 MED ORDER — PREDNISONE 10 MG PO TABS
ORAL_TABLET | ORAL | Status: AC
Start: 2015-08-25 — End: 2015-08-30

## 2015-08-25 MED ORDER — AMOXICILLIN-POT CLAVULANATE 875-125 MG PO TABS: 1.0000 | ORAL_TABLET | Freq: Two times a day (BID) | ORAL | Status: AC

## 2015-08-25 MED ORDER — CYCLOBENZAPRINE HCL 10 MG PO TABS: 10.0000 mg | ORAL_TABLET | Freq: Three times a day (TID) | ORAL | Status: DC | PRN

## 2015-08-25 NOTE — Assessment & Plan Note (Signed)
chronic

## 2015-08-25 NOTE — Patient Instructions (Addendum)
Start the antibiotics Please do eat yogurt daily or take a probiotic daily for the next month or two We want to replace the healthy germs in the gut If you notice foul, watery diarrhea in the next two months, schedule an appointment RIGHT AWAY Start the prednisone If you develop symptoms of dry mouth, blurred vision, frequent urination, etc then check sugar and/or get to urgent care Use the muscle relaxer if needed, and good back posture and lifting Okay to take ibuprofen if needed with that medicine Try turmeric as a natural anti-inflammatory (for pain and arthritis). It comes in capsules where you buy aspirin and fish oil, but also as a spice where you buy pepper and garlic powder. Return in 3-4 weeks for next visit with fasting labs

## 2015-08-25 NOTE — Assessment & Plan Note (Signed)
With additional left-sided lumbar pain without sciatica; once she has had back injury in the past, more likely to have back problems, so proper lifting and posture encouraged

## 2015-08-25 NOTE — Assessment & Plan Note (Signed)
Encouraged her to pay attention to intake of caffeine, chocolate, etc which may be affecting her ability to fall asleep at night; try to avoid those for 6-10 hours prior to onset of sleep; no medicines prescribed for this

## 2015-08-25 NOTE — Telephone Encounter (Signed)
Done - thank you.

## 2015-08-25 NOTE — Telephone Encounter (Signed)
Please send rx for diflucan, get with antibiotics

## 2015-08-25 NOTE — Progress Notes (Signed)
BP 132/84 mmHg  Pulse 66  Temp(Src) 97.8 F (36.6 C) (Oral)  Resp 16  Wt 212 lb (96.163 kg)  SpO2 96%   Subjective:    Patient ID: [name], female    DOB: 1955-09-16, 60 y.o.   MRN: [id number]  HPI: [name] is a 60 y.o. female  Chief Complaint  Patient presents with  . Dizziness    onset 3 weeks.  May be due to sinuses is having some facial pressure. has also had some episodes of light headedness  . Back Pain    patient states sneezed really hard started having stiffness and pain.  But has since gotten better.   Left ear has been clicking since the last visit; pressure in the face, pointing over the maxillary sinuses; using neti pot; certain smells drive her crazy, like cigarette smoke; hx of nasal septum perforation; allergies have not been too bad this year; having dizziness with this; no confusion or headaches more than normal; headaches are not as bad; she thinks the dizziness is related to her sinuses and inner ear  Sneezed really hard and then had back pain, left side; really stiff; was taking ibuprofen; started to ease off on Wednesday; "awful" for the whole first week; had this before, years ago, needed PT; has tolerated muscle relaxers before just fine  Depression screen [facility] 2/9 08/25/2015 02/16/2015  Decreased Interest 0 0  Down, Depressed, Hopeless 1 0  PHQ - 2 Score 1 0   Trouble sleeping; has been going on for years; not related to caffeine; does drink green tea and eat chocolate  Relevant past medical, surgical, family and social history reviewed and updated as indicated. Interim medical history since our last visit reviewed. Allergies and medications reviewed and updated.  Review of Systems Per HPI unless specifically indicated above     Objective:    BP 132/84 mmHg  Pulse 66  Temp(Src) 97.8 F (36.6 C) (Oral)  Resp 16  Wt 212 lb (96.163 kg)  SpO2 96%  Wt Readings from Last 3 Encounters:  08/25/15 212 lb (96.163 kg)  03/28/15 205 lb  (92.987 kg)  02/16/15 203 lb (92.08 kg)    Physical Exam  Constitutional: She appears well-developed and well-nourished.  HENT:  Head: Normocephalic and atraumatic.  Right Ear: Hearing and external ear normal. Tympanic membrane is not erythematous and not retracted. No middle ear effusion.  Left Ear: Hearing and external ear normal. Tympanic membrane is not erythematous and not retracted. A middle ear effusion (with bubbles) is present.  Nose: Rhinorrhea present. No epistaxis.  Mouth/Throat: Oropharynx is clear and moist and mucous membranes are normal. Mucous membranes are not dry. No oropharyngeal exudate, posterior oropharyngeal edema or posterior oropharyngeal erythema.  Septal perforation  Eyes: EOM are normal. No scleral icterus.  Neck: No JVD present.  Cardiovascular: Normal rate and regular rhythm.   Pulmonary/Chest: Effort normal and breath sounds normal. She has no wheezes.  Musculoskeletal:       Lumbar back: She exhibits tenderness (left paraspinal lumbar muscles are tender and tight). She exhibits no bony tenderness, no swelling, no edema and no deformity.  Lymphadenopathy:    She has no cervical adenopathy.  Neurological: She displays no atrophy and no tremor. Coordination and gait normal.  No dysdiadochokinesis; normal finger to nose testing; no facial asymmetry  Psychiatric: She has a normal mood and affect. Her behavior is normal.      Assessment & Plan:   Problem List Items Addressed  This Visit      Respiratory   Perforated nasal septum    chronic        Musculoskeletal and Integument   DDD (degenerative disc disease), lumbar    With additional left-sided lumbar pain without sciatica; once she has had back injury in the past, more likely to have back problems, so proper lifting and posture encouraged      Relevant Medications   cyclobenzaprine (FLEXERIL) 10 MG tablet   predniSONE (DELTASONE) 10 MG tablet     Other   Insomnia - Primary    Encouraged her  to pay attention to intake of caffeine, chocolate, etc which may be affecting her ability to fall asleep at night; try to avoid those for 6-10 hours prior to onset of sleep; no medicines prescribed for this       Other Visit Diagnoses    Acute lumbar back pain        without sciatica; muscle relaxants, cautions given; may use turmeric and OTC NSAID; proper lifting, posture encouraged    Relevant Medications    cyclobenzaprine (FLEXERIL) 10 MG tablet    predniSONE (DELTASONE) 10 MG tablet    Dizziness        suspect related to inner ear; no cerebellar signs; will treat with ABX, steroids; refer to ENT if not improving    Acute maxillary sinusitis, recurrence not specified        rest, hydration; treat with antibiotics, steroid; if not resolving, refer to ENT    Relevant Medications    amoxicillin-clavulanate (AUGMENTIN) 875-125 MG tablet    predniSONE (DELTASONE) 10 MG tablet    Eustachian tube dysfunction, left        prednisone, ABX for suspected infection; to ENT if not improving       Follow up plan: Return 3-4 weeks, for fasting labs and visit.  An after-visit summary was printed and given to the patient at [place].  Please see the patient instructions which may contain other information and recommendations beyond what is mentioned above in the assessment and plan.  Meds ordered this encounter  Medications  . cyclobenzaprine (FLEXERIL) 10 MG tablet    Sig: Take 1 tablet (10 mg total) by mouth every 8 (eight) hours as needed for muscle spasms. Do not drive for 8 hours after taking    Dispense:  30 tablet    Refill:  0  . amoxicillin-clavulanate (AUGMENTIN) 875-125 MG tablet    Sig: Take 1 tablet by mouth 2 (two) times daily.    Dispense:  20 tablet    Refill:  0  . predniSONE (DELTASONE) 10 MG tablet    Sig: Five pills by mouth on day 1, 4 on day 2, 3 on day 3, 2 on day 4, 1 on day 5; take with food    Dispense:  15 tablet    Refill:  0   Fluconazole sent to pharmacy  later in case development of yeast infection

## 2015-09-22 ENCOUNTER — Encounter: Payer: Self-pay | Admitting: Family Medicine

## 2015-09-22 ENCOUNTER — Ambulatory Visit (INDEPENDENT_AMBULATORY_CARE_PROVIDER_SITE_OTHER): Payer: 59 | Admitting: Family Medicine

## 2015-09-22 VITALS — BP 118/84 | HR 87 | Temp 98.1°F | Resp 16 | Wt 207.8 lb

## 2015-09-22 DIAGNOSIS — D473 Essential (hemorrhagic) thrombocythemia: Secondary | ICD-10-CM

## 2015-09-22 DIAGNOSIS — R748 Abnormal levels of other serum enzymes: Secondary | ICD-10-CM

## 2015-09-22 DIAGNOSIS — Z0000 Encounter for general adult medical examination without abnormal findings: Secondary | ICD-10-CM | POA: Diagnosis not present

## 2015-09-22 DIAGNOSIS — Z5181 Encounter for therapeutic drug level monitoring: Secondary | ICD-10-CM | POA: Diagnosis not present

## 2015-09-22 DIAGNOSIS — H6982 Other specified disorders of Eustachian tube, left ear: Secondary | ICD-10-CM | POA: Diagnosis not present

## 2015-09-22 DIAGNOSIS — E785 Hyperlipidemia, unspecified: Secondary | ICD-10-CM | POA: Diagnosis not present

## 2015-09-22 DIAGNOSIS — E669 Obesity, unspecified: Secondary | ICD-10-CM

## 2015-09-22 DIAGNOSIS — E559 Vitamin D deficiency, unspecified: Secondary | ICD-10-CM

## 2015-09-22 DIAGNOSIS — D75839 Thrombocytosis, unspecified: Secondary | ICD-10-CM

## 2015-09-22 DIAGNOSIS — R7301 Impaired fasting glucose: Secondary | ICD-10-CM

## 2015-09-22 NOTE — Assessment & Plan Note (Signed)
Check labs today.

## 2015-09-22 NOTE — Patient Instructions (Addendum)
Check out the [employer] 7 minute work-out Have labs today Try Afrin for 3 days only; spray, lay, roll If left ear not getting better, just call for referral to [place] doctor Return in one year for next physical Return in six months for hypertension  Health Maintenance, Female Adopting a healthy lifestyle and getting preventive care can go a long way to promote health and wellness. Talk with your health care provider about what schedule of regular examinations is right for you. This is a good chance for you to check in with your provider about disease prevention and staying healthy. In between checkups, there are plenty of things you can do on your own. Experts have done a lot of research about which lifestyle changes and preventive measures are most likely to keep you healthy. Ask your health care provider for more information. WEIGHT AND DIET  Eat a healthy diet  Be sure to include plenty of vegetables, fruits, low-fat dairy products, and lean protein.  Do not eat a lot of foods high in solid fats, added sugars, or salt.  Get regular exercise. This is one of the most important things you can do for your health.  Most adults should exercise for at least 150 minutes each week. The exercise should increase your heart rate and make you sweat (moderate-intensity exercise).  Most adults should also do strengthening exercises at least twice a week. This is in addition to the moderate-intensity exercise.  Maintain a healthy weight  Body mass index (BMI) is a measurement that can be used to identify possible weight problems. It estimates body fat based on height and weight. Your health care provider can help determine your BMI and help you achieve or maintain a healthy weight.  For females 47 years of age and older:   A BMI below 18.5 is considered underweight.  A BMI of 18.5 to 24.9 is normal.  A BMI of 25 to 29.9 is considered overweight.  A BMI of 30 and above is considered  obese.  Watch levels of cholesterol and blood lipids  You should start having your blood tested for lipids and cholesterol at 61 years of age, then have this test every 5 years.  You may need to have your cholesterol levels checked more often if:  Your lipid or cholesterol levels are high.  You are older than 61 years of age.  You are at high risk for heart disease.  CANCER SCREENING   Lung Cancer  Lung cancer screening is recommended for adults 64-71 years old who are at high risk for lung cancer because of a history of smoking.  A yearly low-dose CT scan of the lungs is recommended for people who:  Currently smoke.  Have quit within the past 15 years.  Have at least a 30-pack-year history of smoking. A pack year is smoking an average of one pack of cigarettes a day for 1 year.  Yearly screening should continue until it has been 15 years since you quit.  Yearly screening should stop if you develop a health problem that would prevent you from having lung cancer treatment.  Breast Cancer  Practice breast self-awareness. This means understanding how your breasts normally appear and feel.  It also means doing regular breast self-exams. Let your health care provider know about any changes, no matter how small.  If you are in your 20s or 30s, you should have a clinical breast exam (CBE) by a health care provider every 1-3 years as part  of a regular health exam.  If you are 71 or older, have a CBE every year. Also consider having a breast X-ray (mammogram) every year.  If you have a family history of breast cancer, talk to your health care provider about genetic screening.  If you are at high risk for breast cancer, talk to your health care provider about having an MRI and a mammogram every year.  Breast cancer gene (BRCA) assessment is recommended for women who have family members with BRCA-related cancers. BRCA-related cancers  include:  Breast.  Ovarian.  Tubal.  Peritoneal cancers.  Results of the assessment will determine the need for genetic counseling and BRCA1 and BRCA2 testing. Cervical Cancer Your health care provider may recommend that you be screened regularly for cancer of the pelvic organs (ovaries, uterus, and vagina). This screening involves a pelvic examination, including checking for microscopic changes to the surface of your cervix (Pap test). You may be encouraged to have this screening done every 3 years, beginning at age 17.  For women ages 52-65, health care providers may recommend pelvic exams and Pap testing every 3 years, or they may recommend the Pap and pelvic exam, combined with testing for human papilloma virus (HPV), every 5 years. Some types of HPV increase your risk of cervical cancer. Testing for HPV may also be done on women of any age with unclear Pap test results.  Other health care providers may not recommend any screening for nonpregnant women who are considered low risk for pelvic cancer and who do not have symptoms. Ask your health care provider if a screening pelvic exam is right for you.  If you have had past treatment for cervical cancer or a condition that could lead to cancer, you need Pap tests and screening for cancer for at least 20 years after your treatment. If Pap tests have been discontinued, your risk factors (such as having a new sexual partner) need to be reassessed to determine if screening should resume. Some women have medical problems that increase the chance of getting cervical cancer. In these cases, your health care provider may recommend more frequent screening and Pap tests. Colorectal Cancer  This type of cancer can be detected and often prevented.  Routine colorectal cancer screening usually begins at 61 years of age and continues through 61 years of age.  Your health care provider may recommend screening at an earlier age if you have risk factors for  colon cancer.  Your health care provider may also recommend using home test kits to check for hidden blood in the stool.  A small camera at the end of a tube can be used to examine your colon directly (sigmoidoscopy or colonoscopy). This is done to check for the earliest forms of colorectal cancer.  Routine screening usually begins at age 37.  Direct examination of the colon should be repeated every 5-10 years through 61 years of age. However, you may need to be screened more often if early forms of precancerous polyps or small growths are found. Skin Cancer  Check your skin from head to toe regularly.  Tell your health care provider about any new moles or changes in moles, especially if there is a change in a mole's shape or color.  Also tell your health care provider if you have a mole that is larger than the size of a pencil eraser.  Always use sunscreen. Apply sunscreen liberally and repeatedly throughout the day.  Protect yourself by wearing long sleeves, pants, a  wide-brimmed hat, and sunglasses whenever you are outside. HEART DISEASE, DIABETES, AND HIGH BLOOD PRESSURE   High blood pressure causes heart disease and increases the risk of stroke. High blood pressure is more likely to develop in:  People who have blood pressure in the high end of the normal range (130-139/85-89 mm Hg).  People who are overweight or obese.  People who are African American.  If you are 48-71 years of age, have your blood pressure checked every 3-5 years. If you are 65 years of age or older, have your blood pressure checked every year. You should have your blood pressure measured twice--once when you are at a hospital or clinic, and once when you are not at a hospital or clinic. Record the average of the two measurements. To check your blood pressure when you are not at a hospital or clinic, you can use:  An automated blood pressure machine at a pharmacy.  A home blood pressure monitor.  If you  are between 71 years and 73 years old, ask your health care provider if you should take aspirin to prevent strokes.  Have regular diabetes screenings. This involves taking a blood sample to check your fasting blood sugar level.  If you are at a normal weight and have a low risk for diabetes, have this test once every three years after 61 years of age.  If you are overweight and have a high risk for diabetes, consider being tested at a younger age or more often. PREVENTING INFECTION  Hepatitis B  If you have a higher risk for hepatitis B, you should be screened for this virus. You are considered at high risk for hepatitis B if:  You were born in a country where hepatitis B is common. Ask your health care provider which countries are considered high risk.  Your parents were born in a high-risk country, and you have not been immunized against hepatitis B (hepatitis B vaccine).  You have HIV or AIDS.  You use needles to inject street drugs.  You live with someone who has hepatitis B.  You have had sex with someone who has hepatitis B.  You get hemodialysis treatment.  You take certain medicines for conditions, including cancer, organ transplantation, and autoimmune conditions. Hepatitis C  Blood testing is recommended for:  Everyone born from [birth year] through 1966.  Anyone with known risk factors for hepatitis C. Sexually transmitted infections (STIs)  You should be screened for sexually transmitted infections (STIs) including gonorrhea and chlamydia if:  You are sexually active and are younger than 61 years of age.  You are older than 61 years of age and your health care provider tells you that you are at risk for this type of infection.  Your sexual activity has changed since you were last screened and you are at an increased risk for chlamydia or gonorrhea. Ask your health care provider if you are at risk.  If you do not have HIV, but are at risk, it may be recommended that you  take a prescription medicine daily to prevent HIV infection. This is called pre-exposure prophylaxis (PrEP). You are considered at risk if:  You are sexually active and do not regularly use condoms or know the HIV status of your partner(s).  You take drugs by injection.  You are sexually active with a partner who has HIV. Talk with your health care provider about whether you are at high risk of being infected with HIV. If you choose to begin PrEP,  you should first be tested for HIV. You should then be tested every 3 months for as long as you are taking PrEP.  PREGNANCY   If you are premenopausal and you may become pregnant, ask your health care provider about preconception counseling.  If you may become pregnant, take 400 to 800 micrograms (mcg) of folic acid every day.  If you want to prevent pregnancy, talk to your health care provider about birth control (contraception). OSTEOPOROSIS AND MENOPAUSE   Osteoporosis is a disease in which the bones lose minerals and strength with aging. This can result in serious bone fractures. Your risk for osteoporosis can be identified using a bone density scan.  If you are 90 years of age or older, or if you are at risk for osteoporosis and fractures, ask your health care provider if you should be screened.  Ask your health care provider whether you should take a calcium or vitamin D supplement to lower your risk for osteoporosis.  Menopause may have certain physical symptoms and risks.  Hormone replacement therapy may reduce some of these symptoms and risks. Talk to your health care provider about whether hormone replacement therapy is right for you.  HOME CARE INSTRUCTIONS   Schedule regular health, dental, and eye exams.  Stay current with your immunizations.   Do not use any tobacco products including cigarettes, chewing tobacco, or electronic cigarettes.  If you are pregnant, do not drink alcohol.  If you are breastfeeding, limit how  much and how often you drink alcohol.  Limit alcohol intake to no more than 1 drink per day for nonpregnant women. One drink equals 12 ounces of beer, 5 ounces of wine, or 1 ounces of hard liquor.  Do not use street drugs.  Do not share needles.  Ask your health care provider for help if you need support or information about quitting drugs.  Tell your health care provider if you often feel depressed.  Tell your health care provider if you have ever been abused or do not feel safe at home.   This information is not intended to replace advice given to you by your health care provider. Make sure you discuss any questions you have with your health care provider.   Document Released: 11/12/2010 Document Revised: 05/20/2014 Document Reviewed: 03/31/2013 Elsevier Interactive Patient Education [employer].

## 2015-09-22 NOTE — Progress Notes (Signed)
Patient ID: [name], female   DOB: 1955-06-28, 60 y.o.   MRN: [id number]   Subjective:   [name] is a 60 y.o. female here for follow-up; we both thought she was here initially for a complete physical; then we learned that she was not due for a CPE, so changed to problem-based visit  Interim issues since last visit: goes to see endo on Wednesday  Still having issues with her left ear; clicking and popping; no pain; no drainage; no fevers; no sore throat; she has taken the amoxicillin; as feeling some better, but then started clicking back; no lymph node enlargement; no nights sweats  HTN; on HCTZ; on that for a while; does check BP away from the doctor; if she eats something she knows she should not eat; none over 150 USPSTF grade A and B recommendations Alcohol: occasional Depression:  Depression screen [facility] 2/9 08/25/2015 02/16/2015  Decreased Interest 0 0  Down, Depressed, Hopeless 1 0  PHQ - 2 Score 1 0   Hypertension: doing well Obesity: lost five pounds since last visit Tobacco use: around 2nd hand smoke HIV, hep B, hep C: UTD STD testing and prevention (chl/gon/syphilis): asx Lipids: check today Glucose: check today; fasting Colorectal cancer: UTD Breast cancer: UTD BRCA gene screening:no fam hx Intimate partner violence:no Cervical cancer screening: s/p hyst bleeding, no cancer Lung cancer: n/a Osteoporosis: no lengthy steroids Fall prevention/vitamin D: not taking vit D; discussed AAA: n/a Aspirin: taking baby aspirin 81 mg Diet: trying to lose weight Exercise: trying to lose weight Skin cancer: no worrisome moles   Past Medical History  Diagnosis Date  . Fibrocystic disease of both breasts   . GERD (gastroesophageal reflux disease)   . Osteoarthritis   . Perforation of nasal septum   . Benign hypertensive renal disease   . Hyperlipidemia   . DDD (degenerative disc disease), lumbar   . Insomnia   . Impaired fasting glucose   . Recurrent major  depression ([place])   . Menopausal state   . Migraine   . 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 Elevated alkaline phosphatase level   . Sleep apnea     Does not use CPAP  . Spondylosis   . Thrombocytosis ([place]) 10/01/2015    mild   Past Surgical History  Procedure Laterality Date  . Pituitary surgery      x2 30+ years ago  . Cholecystectomy  2003  . Abdominal hysterectomy      partial with bladder suspension, no cervix  . Knee surgery Right 2014  . Transnasal approach pituitary adenoma resection  2015    clival/parasellar mass  . Colonoscopy  2008    1 polyp- return 10 years   Family History  Problem Relation Age of Onset  . Diabetes Mother   . Hypertension Mother   . Cancer Mother     possible liver  . Heart disease Father   . Hypertension Father   . Heart attack Father   . Alzheimer's disease Father   . Diabetes Sister   . Hypertension Sister   . Cancer Sister     lung?  . Stroke Sister   . Atrial fibrillation Sister   . Breast cancer Maternal Grandmother    Social History  Substance Use Topics  . Smoking status: Never Smoker   . Smokeless tobacco: Never Used  . Alcohol Use: No     Comment: rare   Review of Systems  Constitutional: Negative for unexpected weight  change.  HENT: Negative for ear pain (just clicking just on the left side).   Respiratory: Negative for shortness of breath.   Cardiovascular: Negative for chest pain.  Gastrointestinal: Negative for abdominal pain and blood in stool.  Endocrine: Positive for polydipsia (just sometimes).  Genitourinary: Negative for hematuria and vaginal discharge.  Skin:       No worrisome moles  Allergic/Immunologic: Negative for food allergies.  Neurological: Negative for dizziness (subsided since last visit) and headaches.  Hematological: Does not bruise/bleed easily.   Objective:   Filed Vitals:   09/22/15 0906  BP: 118/84  Pulse: 87  Temp: 98.1 F (36.7 C)  TempSrc: Oral  Resp: 16  Weight: 207 lb 12.8 oz  (94.257 kg)  SpO2: 97%   Body mass index is 33.24 kg/(m^2). Wt Readings from Last 3 Encounters:  09/22/15 207 lb 12.8 oz (94.257 kg)  08/25/15 212 lb (96.163 kg)  03/28/15 205 lb (92.987 kg)   Physical Exam  Constitutional: She appears well-developed and well-nourished. No distress.  HENT:  Head: Normocephalic and atraumatic.  Mouth/Throat: Oropharynx is clear and moist.  Chronic septal perforation  Eyes: EOM are normal. No scleral icterus.  Neck: No thyromegaly present.  Cardiovascular: Normal rate, regular rhythm and normal heart sounds.   No murmur heard. Pulmonary/Chest: Effort normal and breath sounds normal. No respiratory distress. She has no wheezes.  Abdominal: Soft. Bowel sounds are normal. She exhibits no distension.  Musculoskeletal: Normal range of motion. She exhibits no edema.  Lymphadenopathy:    She has no cervical adenopathy.  Neurological: She is alert. She exhibits normal muscle tone.  Skin: Skin is warm and dry. She is not diaphoretic. No pallor.  Psychiatric: She has a normal mood and affect. Her behavior is normal. Judgment and thought content normal.    Assessment/Plan:   Problem List Items Addressed This Visit      Endocrine   Impaired fasting glucose    Last glucose in October was under 100; she has lost weight since last visit; work on weight control, activity, healthy eating; check A1c and continue metformin      Relevant Orders   Hemoglobin A1c     Nervous and Auditory   Dysfunction of left Eustachian tube    Will have her try afrin for 3 days and 3 days ONLY; she may call for referral to ENT if needed        Hematopoietic and Hemostatic   Thrombocytosis (HCC)   Relevant Orders   CBC with Differential/Platelet     Other   Alkaline phosphatase elevation   Relevant Orders   Comprehensive metabolic panel   Hyperlipidemia    Monitor lipids      Relevant Orders   Lipid Panel w/o Chol/HDL Ratio   Medication monitoring encounter     Check creatinine      Relevant Orders   Comprehensive metabolic panel   Obesity    Encouraged weight management, healthy eating, activity      Preventative health care    Check labs today      Vitamin D deficiency - Primary    Check level and supplement as indcated      Relevant Orders   VITAMIN D 25 Hydroxy (Vit-D Deficiency, Fractures)      No orders of the defined types were placed in this encounter.   Orders Placed This Encounter  Procedures  . VITAMIN D 25 Hydroxy (Vit-D Deficiency, Fractures)  . Hemoglobin A1c    Standing Status: Future  Number of Occurrences:      Standing Expiration Date: 11/10/2015  . Comprehensive metabolic panel    Standing Status: Future     Number of Occurrences:      Standing Expiration Date: 11/10/2015    Order Specific Question:  Has the patient fasted?    Answer:  Yes  . CBC with Differential/Platelet    Standing Status: Future     Number of Occurrences:      Standing Expiration Date: 11/10/2015  . Lipid Panel w/o Chol/HDL Ratio    Standing Status: Future     Number of Occurrences:      Standing Expiration Date: 11/10/2015    Order Specific Question:  Has the patient fasted?    Answer:  Yes   Follow up plan: No Follow-up on file.  An after-visit summary was printed and given to the patient at [facility].  Please see the patient instructions which may contain other information and recommendations beyond what is mentioned above in the assessment and plan.

## 2015-09-27 DIAGNOSIS — R7303 Prediabetes: Secondary | ICD-10-CM | POA: Insufficient documentation

## 2015-10-01 ENCOUNTER — Encounter: Payer: Self-pay | Admitting: Family Medicine

## 2015-10-01 ENCOUNTER — Telehealth: Payer: Self-pay | Admitting: Family Medicine

## 2015-10-01 DIAGNOSIS — D473 Essential (hemorrhagic) thrombocythemia: Secondary | ICD-10-CM | POA: Insufficient documentation

## 2015-10-01 DIAGNOSIS — R748 Abnormal levels of other serum enzymes: Secondary | ICD-10-CM | POA: Insufficient documentation

## 2015-10-01 DIAGNOSIS — H6982 Other specified disorders of Eustachian tube, left ear: Secondary | ICD-10-CM | POA: Insufficient documentation

## 2015-10-01 DIAGNOSIS — Z5181 Encounter for therapeutic drug level monitoring: Secondary | ICD-10-CM | POA: Insufficient documentation

## 2015-10-01 DIAGNOSIS — D75839 Thrombocytosis, unspecified: Secondary | ICD-10-CM | POA: Insufficient documentation

## 2015-10-01 DIAGNOSIS — E559 Vitamin D deficiency, unspecified: Secondary | ICD-10-CM | POA: Insufficient documentation

## 2015-10-01 HISTORY — DX: Thrombocytosis, unspecified: D75.839

## 2015-10-01 NOTE — Assessment & Plan Note (Signed)
Will have her try afrin for 3 days and 3 days ONLY; she may call for referral to ENT if needed

## 2015-10-01 NOTE — Assessment & Plan Note (Signed)
Check level and supplement as indcated

## 2015-10-01 NOTE — Assessment & Plan Note (Signed)
Encouraged weight management, healthy eating, activity

## 2015-10-01 NOTE — Telephone Encounter (Signed)
I called, left msg; new lab orders to be picked up with new codes; don't use old orders; she can swing by office any day this week and get them; have labs done fasting

## 2015-10-01 NOTE — Assessment & Plan Note (Signed)
Check creatinine 

## 2015-10-01 NOTE — Assessment & Plan Note (Signed)
Monitor lipids

## 2015-10-01 NOTE — Assessment & Plan Note (Addendum)
Last glucose in October was under 100; she has lost weight since last visit; work on weight control, activity, healthy eating; check A1c and continue metformin

## 2015-12-18 ENCOUNTER — Other Ambulatory Visit: Payer: Self-pay

## 2015-12-18 NOTE — Telephone Encounter (Signed)
90 day supply

## 2015-12-19 MED ORDER — HYDROCHLOROTHIAZIDE 25 MG PO TABS: 25.0000 mg | ORAL_TABLET | Freq: Every day | ORAL | 0 refills | Status: DC

## 2015-12-19 NOTE — Telephone Encounter (Signed)
Pt.notified

## 2015-12-19 NOTE — Telephone Encounter (Signed)
Please remind patient to get labs drawn that were ordered in May Thank you Rx approved

## 2016-02-07 ENCOUNTER — Other Ambulatory Visit: Payer: Self-pay | Admitting: Family Medicine

## 2016-02-07 LAB — CBC WITH DIFFERENTIAL/PLATELET
Basophils Absolute: 0 10*3/uL (ref 0.0–0.2)
Basos: 0 %
EOS (ABSOLUTE): 0.1 10*3/uL (ref 0.0–0.4)
Eos: 2 %
Hematocrit: 36.6 % (ref 34.0–46.6)
Hemoglobin: 11.7 g/dL (ref 11.1–15.9)
Immature Grans (Abs): 0 10*3/uL (ref 0.0–0.1)
Immature Granulocytes: 0 %
Lymphocytes Absolute: 1.4 10*3/uL (ref 0.7–3.1)
Lymphs: 30 %
MCH: 26.1 pg — ABNORMAL LOW (ref 26.6–33.0)
MCHC: 32 g/dL (ref 31.5–35.7)
MCV: 82 fL (ref 79–97)
Monocytes Absolute: 0.4 10*3/uL (ref 0.1–0.9)
Monocytes: 9 %
Neutrophils Absolute: 2.7 10*3/uL (ref 1.4–7.0)
Neutrophils: 59 %
Platelets: 493 10*3/uL — ABNORMAL HIGH (ref 150–379)
RBC: 4.49 x10E6/uL (ref 3.77–5.28)
RDW: 15 % (ref 12.3–15.4)
WBC: 4.7 10*3/uL (ref 3.4–10.8)

## 2016-02-07 LAB — LIPID PANEL W/O CHOL/HDL RATIO
Cholesterol, Total: 155 mg/dL (ref 100–199)
HDL: 48 mg/dL (ref >39)
LDL Calculated: 92 mg/dL (ref 0–99)
Triglycerides: 76 mg/dL (ref 0–149)
VLDL Cholesterol Cal: 15 mg/dL (ref 5–40)

## 2016-02-07 LAB — COMPREHENSIVE METABOLIC PANEL
ALT: 18 IU/L (ref 0–32)
AST: 17 IU/L (ref 0–40)
Albumin/Globulin Ratio: 1.3 (ref 1.2–2.2)
Albumin: 4.1 g/dL (ref 3.6–4.8)
Alkaline Phosphatase: 115 IU/L (ref 39–117)
BUN/Creatinine Ratio: 11 — ABNORMAL LOW (ref 12–28)
BUN: 11 mg/dL (ref 8–27)
Bilirubin Total: <0.2 mg/dL (ref 0.0–1.2)
CO2: 27 mmol/L (ref 18–29)
Calcium: 9.9 mg/dL (ref 8.7–10.3)
Chloride: 101 mmol/L (ref 96–106)
Creatinine, Ser: 0.99 mg/dL (ref 0.57–1.00)
GFR calc Af Amer: 71 mL/min/{1.73_m2} (ref >59)
GFR calc non Af Amer: 62 mL/min/{1.73_m2} (ref >59)
Globulin, Total: 3.1 g/dL (ref 1.5–4.5)
Glucose: 105 mg/dL — ABNORMAL HIGH (ref 65–99)
Potassium: 4.1 mmol/L (ref 3.5–5.2)
Sodium: 143 mmol/L (ref 134–144)
Total Protein: 7.2 g/dL (ref 6.0–8.5)

## 2016-02-07 LAB — HEMOGLOBIN A1C
Est. average glucose Bld gHb Est-mCnc: 137 mg/dL
Hgb A1c MFr Bld: 6.4 % — ABNORMAL HIGH (ref 4.8–5.6)

## 2016-02-07 LAB — VITAMIN D 25 HYDROXY (VIT D DEFICIENCY, FRACTURES): Vit D, 25-Hydroxy: 20 ng/mL — ABNORMAL LOW (ref 30.0–100.0)

## 2016-02-07 MED ORDER — VITAMIN D (ERGOCALCIFEROL) 1.25 MG (50000 UNIT) PO CAPS: 50000.0000 [IU] | ORAL_CAPSULE | ORAL | 1 refills | Status: AC

## 2016-02-07 NOTE — Progress Notes (Signed)
Vit D Rx twice a month for 2 months

## 2016-02-19 ENCOUNTER — Ambulatory Visit (INDEPENDENT_AMBULATORY_CARE_PROVIDER_SITE_OTHER): Payer: Self-pay | Admitting: Family Medicine

## 2016-02-19 ENCOUNTER — Encounter: Payer: Self-pay | Admitting: Family Medicine

## 2016-02-19 DIAGNOSIS — M545 Low back pain, unspecified: Secondary | ICD-10-CM

## 2016-02-19 DIAGNOSIS — Z1239 Encounter for other screening for malignant neoplasm of breast: Secondary | ICD-10-CM

## 2016-02-19 DIAGNOSIS — E559 Vitamin D deficiency, unspecified: Secondary | ICD-10-CM

## 2016-02-19 DIAGNOSIS — Z1231 Encounter for screening mammogram for malignant neoplasm of breast: Secondary | ICD-10-CM

## 2016-02-19 DIAGNOSIS — Z0000 Encounter for general adult medical examination without abnormal findings: Secondary | ICD-10-CM

## 2016-02-19 MED ORDER — FLUTICASONE PROPIONATE 50 MCG/ACT NA SUSP: 2.0000 | Freq: Every day | NASAL | 6 refills | Status: DC

## 2016-02-19 NOTE — Assessment & Plan Note (Signed)
Order mammo; encouraged monthly SBE

## 2016-02-19 NOTE — Progress Notes (Signed)
Patient ID: [name], female   DOB: Jan 22, 1956, 60 y.o.   MRN: [id number]   Subjective:   [name] is a 60 y.o. female here for a complete physical exam  Interim issues since last visit: no medical excitement She thinks she has a urinary tract infection; no  Left ear is still bothering her; around people who smoke; she thinks it happens after inhaling cigarette smoke, just a little bit today b/c she has not been around smoke; not painful, clicking; saw ENT across the street; he sent her to another specialist at [facility]; endocrinologist told him to look again, all together with [place]a  USPSTF grade A and B recommendations Alcohol: no Depression: Depression screen [facility] 2/9 02/19/2016 08/25/2015 02/16/2015  Decreased Interest 0 0 0  Down, Depressed, Hopeless 0 1 0  PHQ - 2 Score 0 1 0    Hypertension: controlled Obesity: losing weight Tobacco use: around 2nd hand smoke HIV, hep B, hep C: UTD STD testing and prevention (chl/gon/syphilis): declined Lipids: check today Glucose: check today Colorectal cancer: 2026 Breast cancer: last fall, order BRCA gene screening: no fam hx Intimate partner violence: no Cervical cancer screening: s/p hyst Lung cancer: n/a Osteoporosis: do at 12 Fall prevention/vitamin D: on Rx AAA: n/a Aspirin: recommended Diet: room for improvement Exercise: walking every day, trying to lose weight Skin cancer: no worrisome  Past Medical History:  Diagnosis Date  . Benign hypertensive renal disease   . DDD (degenerative disc disease), lumbar   . Elevated alkaline phosphatase level   . Fibrocystic disease of both breasts   . GERD (gastroesophageal reflux disease)   . Hyperlipidemia   . Impaired fasting glucose   . Insomnia   . Menopausal state   . Migraine   . Osteoarthritis   . 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 Perforation of nasal septum   . Recurrent major depression ([place])   . Sleep apnea    Does not use CPAP  . Spondylosis    . Thrombocytosis ([place]) 10/01/2015   mild   Past Surgical History:  Procedure Laterality Date  . ABDOMINAL HYSTERECTOMY     partial with bladder suspension, no cervix  . CHOLECYSTECTOMY  2003  . COLONOSCOPY  2008   1 polyp- return 10 years  . KNEE SURGERY Right 2014  . PITUITARY SURGERY     x2 30+ years ago  . 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 Diabetes Mother   . Hypertension Mother   . Cancer Mother     possible liver  . Heart disease Father   . Hypertension Father   . Heart attack Father   . Alzheimer's disease Father   . Diabetes Sister   . Hypertension Sister   . Cancer Sister     lung?  . Stroke Sister   . Atrial fibrillation Sister   . Breast cancer Maternal Grandmother    Social History  Substance Use Topics  . Smoking status: Never Smoker  . Smokeless tobacco: Never Used  . Alcohol use No     Comment: rare   Review of Systems  Gastrointestinal:       Acid reflux; she'll try Zantac  Genitourinary: Positive for decreased urine volume. Negative for dysuria.   Objective:   Vitals:   02/19/16 0817  BP: 122/84  Pulse: 80  Resp: 14  Temp: 98.7 F (37.1 C)  TempSrc: Oral  SpO2: 94%  Weight: 205 lb (93 kg)   Body mass index is 32.79 kg/m. Wt Readings from Last 3 Encounters:  02/19/16 205 lb (93 kg)  09/22/15 207 lb 12.8 oz (94.3 kg)  08/25/15 212 lb (96.2 kg)   Physical Exam  Constitutional: She appears well-developed and well-nourished.  HENT:  Head: Normocephalic and atraumatic.  Right Ear: Hearing, tympanic membrane, external ear and ear canal normal.  Left Ear: Hearing, tympanic membrane, external ear and ear canal normal.  Eyes: Conjunctivae and EOM are normal. Right eye exhibits no hordeolum. Left eye exhibits no hordeolum. No scleral icterus.  Neck: Carotid bruit is not present. No thyromegaly present.  Cardiovascular: Normal rate, regular  rhythm, S1 normal, S2 normal and normal heart sounds.   No extrasystoles are present.  Pulmonary/Chest: Effort normal and breath sounds normal. No respiratory distress. Right breast exhibits no inverted nipple, no mass, no nipple discharge, no skin change and no tenderness. Left breast exhibits no inverted nipple, no mass, no nipple discharge, no skin change and no tenderness. Breasts are symmetrical.  Abdominal: Soft. Normal appearance and bowel sounds are normal. She exhibits no distension, no abdominal bruit and no pulsatile midline mass. There is no hepatosplenomegaly. There is no tenderness. No hernia.  Musculoskeletal: Normal range of motion. She exhibits no edema.  Lymphadenopathy:       Head (right side): No submandibular adenopathy present.       Head (left side): No submandibular adenopathy present.    She has no cervical adenopathy.    She has no axillary adenopathy.  Neurological: She is alert. She displays no tremor. No cranial nerve deficit. She exhibits normal muscle tone. Gait normal.  Reflex Scores:      Patellar reflexes are 2+ on the right side and 2+ on the left side. Skin: Skin is warm and dry. No bruising and no ecchymosis noted. No cyanosis. No pallor.  Psychiatric: Her speech is normal and behavior is normal. Thought content normal. Her mood appears not anxious. She does not exhibit a depressed mood.   Assessment/Plan:   Problem List Items Addressed This Visit      Other   Vitamin D deficiency    On replacement      Preventative health care    Screening labs today USPSTF grade A and B recommendations reviewed with patient; age-appropriate recommendations, preventive care, screening tests, etc discussed and encouraged; healthy living encouraged; see AVS for patient education given to patient      Low back pain    Check urine for UTI      Relevant Orders   Urinalysis w microscopic + reflex cultur   Breast cancer screening    Order mammo; encouraged monthly  SBE      Relevant Orders   MM DIGITAL SCREENING BILATERAL    Other Visit Diagnoses   None.     Meds ordered this encounter  Medications  . fluticasone (FLONASE) 50 MCG/ACT nasal spray    Sig: Place 2 sprays into both nostrils daily.    Dispense:  16 g    Refill:  6   Orders Placed This Encounter  Procedures  . MM DIGITAL SCREENING BILATERAL    Standing Status:   Future    Standing Expiration Date:   02/18/2017    Order Specific Question:   Reason for Exam (SYMPTOM  OR DIAGNOSIS REQUIRED)    Answer:   screening    Order Specific Question:   Preferred imaging location?  Answer:   [facility]  . Urinalysis w microscopic + reflex cultur    Follow up plan: Return in about 1 year (around 02/18/2017) for complete physical.  An After Visit Summary was printed and given to the patient.

## 2016-02-19 NOTE — Patient Instructions (Addendum)
Let's try the nasal steroid If not improving, then we'll refer you to an Ear Nose Throat I do recommend a coated 81 mg aspirin daily  Health Maintenance, Female Adopting a healthy lifestyle and getting preventive care can go a long way to promote health and wellness. Talk with your health care provider about what schedule of regular examinations is right for you. This is a good chance for you to check in with your provider about disease prevention and staying healthy. In between checkups, there are plenty of things you can do on your own. Experts have done a lot of research about which lifestyle changes and preventive measures are most likely to keep you healthy. Ask your health care provider for more information. WEIGHT AND DIET  Eat a healthy diet  Be sure to include plenty of vegetables, fruits, low-fat dairy products, and lean protein.  Do not eat a lot of foods high in solid fats, added sugars, or salt.  Get regular exercise. This is one of the most important things you can do for your health.  Most adults should exercise for at least 150 minutes each week. The exercise should increase your heart rate and make you sweat (moderate-intensity exercise).  Most adults should also do strengthening exercises at least twice a week. This is in addition to the moderate-intensity exercise.  Maintain a healthy weight  Body mass index (BMI) is a measurement that can be used to identify possible weight problems. It estimates body fat based on height and weight. Your health care provider can help determine your BMI and help you achieve or maintain a healthy weight.  For females 16 years of age and older:   A BMI below 18.5 is considered underweight.  A BMI of 18.5 to 24.9 is normal.  A BMI of 25 to 29.9 is considered overweight.  A BMI of 30 and above is considered obese.  Watch levels of cholesterol and blood lipids  You should start having your blood tested for lipids and cholesterol at  61 years of age, then have this test every 5 years.  You may need to have your cholesterol levels checked more often if:  Your lipid or cholesterol levels are high.  You are older than 61 years of age.  You are at high risk for heart disease.  CANCER SCREENING   Lung Cancer  Lung cancer screening is recommended for adults 61-12 years old who are at high risk for lung cancer because of a history of smoking.  A yearly low-dose CT scan of the lungs is recommended for people who:  Currently smoke.  Have quit within the past 15 years.  Have at least a 30-pack-year history of smoking. A pack year is smoking an average of one pack of cigarettes a day for 1 year.  Yearly screening should continue until it has been 15 years since you quit.  Yearly screening should stop if you develop a health problem that would prevent you from having lung cancer treatment.  Breast Cancer  Practice breast self-awareness. This means understanding how your breasts normally appear and feel.  It also means doing regular breast self-exams. Let your health care provider know about any changes, no matter how small.  If you are in your 20s or 30s, you should have a clinical breast exam (CBE) by a health care provider every 1-3 years as part of a regular health exam.  If you are 30 or older, have a CBE every year. Also consider having a  breast X-ray (mammogram) every year.  If you have a family history of breast cancer, talk to your health care provider about genetic screening.  If you are at high risk for breast cancer, talk to your health care provider about having an MRI and a mammogram every year.  Breast cancer gene (BRCA) assessment is recommended for women who have family members with BRCA-related cancers. BRCA-related cancers include:  Breast.  Ovarian.  Tubal.  Peritoneal cancers.  Results of the assessment will determine the need for genetic counseling and BRCA1 and BRCA2  testing. Cervical Cancer Your health care provider may recommend that you be screened regularly for cancer of the pelvic organs (ovaries, uterus, and vagina). This screening involves a pelvic examination, including checking for microscopic changes to the surface of your cervix (Pap test). You may be encouraged to have this screening done every 3 years, beginning at age 21.  For women ages 30-65, health care providers may recommend pelvic exams and Pap testing every 3 years, or they may recommend the Pap and pelvic exam, combined with testing for human papilloma virus (HPV), every 5 years. Some types of HPV increase your risk of cervical cancer. Testing for HPV may also be done on women of any age with unclear Pap test results.  Other health care providers may not recommend any screening for nonpregnant women who are considered low risk for pelvic cancer and who do not have symptoms. Ask your health care provider if a screening pelvic exam is right for you.  If you have had past treatment for cervical cancer or a condition that could lead to cancer, you need Pap tests and screening for cancer for at least 20 years after your treatment. If Pap tests have been discontinued, your risk factors (such as having a new sexual partner) need to be reassessed to determine if screening should resume. Some women have medical problems that increase the chance of getting cervical cancer. In these cases, your health care provider may recommend more frequent screening and Pap tests. Colorectal Cancer  This type of cancer can be detected and often prevented.  Routine colorectal cancer screening usually begins at 61 years of age and continues through 61 years of age.  Your health care provider may recommend screening at an earlier age if you have risk factors for colon cancer.  Your health care provider may also recommend using home test kits to check for hidden blood in the stool.  A small camera at the end of a  tube can be used to examine your colon directly (sigmoidoscopy or colonoscopy). This is done to check for the earliest forms of colorectal cancer.  Routine screening usually begins at age 50.  Direct examination of the colon should be repeated every 5-10 years through 61 years of age. However, you may need to be screened more often if early forms of precancerous polyps or small growths are found. Skin Cancer  Check your skin from head to toe regularly.  Tell your health care provider about any new moles or changes in moles, especially if there is a change in a mole's shape or color.  Also tell your health care provider if you have a mole that is larger than the size of a pencil eraser.  Always use sunscreen. Apply sunscreen liberally and repeatedly throughout the day.  Protect yourself by wearing long sleeves, pants, a wide-brimmed hat, and sunglasses whenever you are outside. HEART DISEASE, DIABETES, AND HIGH BLOOD PRESSURE   High blood pressure causes   heart disease and increases the risk of stroke. High blood pressure is more likely to develop in:  People who have blood pressure in the high end of the normal range (130-139/85-89 mm Hg).  People who are overweight or obese.  People who are African American.  If you are 18-39 years of age, have your blood pressure checked every 3-5 years. If you are 40 years of age or older, have your blood pressure checked every year. You should have your blood pressure measured twice--once when you are at a hospital or clinic, and once when you are not at a hospital or clinic. Record the average of the two measurements. To check your blood pressure when you are not at a hospital or clinic, you can use:  An automated blood pressure machine at a pharmacy.  A home blood pressure monitor.  If you are between 55 years and 79 years old, ask your health care provider if you should take aspirin to prevent strokes.  Have regular diabetes screenings. This  involves taking a blood sample to check your fasting blood sugar level.  If you are at a normal weight and have a low risk for diabetes, have this test once every three years after 61 years of age.  If you are overweight and have a high risk for diabetes, consider being tested at a younger age or more often. PREVENTING INFECTION  Hepatitis B  If you have a higher risk for hepatitis B, you should be screened for this virus. You are considered at high risk for hepatitis B if:  You were born in a country where hepatitis B is common. Ask your health care provider which countries are considered high risk.  Your parents were born in a high-risk country, and you have not been immunized against hepatitis B (hepatitis B vaccine).  You have HIV or AIDS.  You use needles to inject street drugs.  You live with someone who has hepatitis B.  You have had sex with someone who has hepatitis B.  You get hemodialysis treatment.  You take certain medicines for conditions, including cancer, organ transplantation, and autoimmune conditions. Hepatitis C  Blood testing is recommended for:  Everyone born from 1945 through 1965.  Anyone with known risk factors for hepatitis C. Sexually transmitted infections (STIs)  You should be screened for sexually transmitted infections (STIs) including gonorrhea and chlamydia if:  You are sexually active and are younger than 61 years of age.  You are older than 61 years of age and your health care provider tells you that you are at risk for this type of infection.  Your sexual activity has changed since you were last screened and you are at an increased risk for chlamydia or gonorrhea. Ask your health care provider if you are at risk.  If you do not have HIV, but are at risk, it may be recommended that you take a prescription medicine daily to prevent HIV infection. This is called pre-exposure prophylaxis (PrEP). You are considered at risk if:  You are  sexually active and do not regularly use condoms or know the HIV status of your partner(s).  You take drugs by injection.  You are sexually active with a partner who has HIV. Talk with your health care provider about whether you are at high risk of being infected with HIV. If you choose to begin PrEP, you should first be tested for HIV. You should then be tested every 3 months for as long as you are   taking PrEP.  PREGNANCY   If you are premenopausal and you may become pregnant, ask your health care provider about preconception counseling.  If you may become pregnant, take 400 to 800 micrograms (mcg) of folic acid every day.  If you want to prevent pregnancy, talk to your health care provider about birth control (contraception). OSTEOPOROSIS AND MENOPAUSE   Osteoporosis is a disease in which the bones lose minerals and strength with aging. This can result in serious bone fractures. Your risk for osteoporosis can be identified using a bone density scan.  If you are 65 years of age or older, or if you are at risk for osteoporosis and fractures, ask your health care provider if you should be screened.  Ask your health care provider whether you should take a calcium or vitamin D supplement to lower your risk for osteoporosis.  Menopause may have certain physical symptoms and risks.  Hormone replacement therapy may reduce some of these symptoms and risks. Talk to your health care provider about whether hormone replacement therapy is right for you.  HOME CARE INSTRUCTIONS   Schedule regular health, dental, and eye exams.  Stay current with your immunizations.   Do not use any tobacco products including cigarettes, chewing tobacco, or electronic cigarettes.  If you are pregnant, do not drink alcohol.  If you are breastfeeding, limit how much and how often you drink alcohol.  Limit alcohol intake to no more than 1 drink per day for nonpregnant women. One drink equals 12 ounces of beer, 5  ounces of wine, or 1 ounces of hard liquor.  Do not use street drugs.  Do not share needles.  Ask your health care provider for help if you need support or information about quitting drugs.  Tell your health care provider if you often feel depressed.  Tell your health care provider if you have ever been abused or do not feel safe at home.   This information is not intended to replace advice given to you by your health care provider. Make sure you discuss any questions you have with your health care provider.   Document Released: 11/12/2010 Document Revised: 05/20/2014 Document Reviewed: 03/31/2013 Elsevier Interactive Patient Education 2016 [employer]  

## 2016-02-19 NOTE — Assessment & Plan Note (Signed)
Check urine for UTI

## 2016-02-19 NOTE — Assessment & Plan Note (Signed)
Screening labs today USPSTF grade A and B recommendations reviewed with patient; age-appropriate recommendations, preventive care, screening tests, etc discussed and encouraged; healthy living encouraged; see AVS for patient education given to patient

## 2016-02-19 NOTE — Assessment & Plan Note (Signed)
On replacement

## 2016-02-20 ENCOUNTER — Other Ambulatory Visit: Payer: Self-pay | Admitting: Family Medicine

## 2016-02-20 LAB — URINALYSIS W MICROSCOPIC + REFLEX CULTURE
Bilirubin Urine: NEGATIVE
Casts: NONE SEEN [LPF]
Crystals: NONE SEEN [HPF]
Glucose, UA: NEGATIVE
Hgb urine dipstick: NEGATIVE
Ketones, ur: NEGATIVE
Nitrite: POSITIVE — AB
Protein, ur: NEGATIVE
RBC / HPF: NONE SEEN RBC/HPF (ref <=2)
Specific Gravity, Urine: 1.014 (ref 1.001–1.035)
WBC, UA: >60 WBC/HPF — AB (ref <=5)
Yeast: NONE SEEN [HPF]
pH: 6 (ref 5.0–8.0)

## 2016-02-20 MED ORDER — CIPROFLOXACIN HCL 250 MG PO TABS: 250.0000 mg | ORAL_TABLET | Freq: Two times a day (BID) | ORAL | 0 refills | Status: AC

## 2016-02-20 NOTE — Progress Notes (Signed)
rx for UTI

## 2016-02-21 ENCOUNTER — Ambulatory Visit
Admission: RE | Admit: 2016-02-21 | Discharge: 2016-02-21 | Disposition: A | Discharge status: Home / Self Care | Payer: 59 | Source: Ambulatory Visit | Attending: Family Medicine | Admitting: Family Medicine

## 2016-02-21 DIAGNOSIS — Z1239 Encounter for other screening for malignant neoplasm of breast: Secondary | ICD-10-CM

## 2016-02-21 DIAGNOSIS — Z1231 Encounter for screening mammogram for malignant neoplasm of breast: Secondary | ICD-10-CM | POA: Insufficient documentation

## 2016-02-22 LAB — URINE CULTURE

## 2016-04-09 ENCOUNTER — Other Ambulatory Visit: Payer: Self-pay | Admitting: Family Medicine

## 2016-07-04 ENCOUNTER — Other Ambulatory Visit: Payer: Self-pay | Admitting: Family Medicine

## 2016-09-28 ENCOUNTER — Other Ambulatory Visit: Payer: Self-pay | Admitting: Family Medicine

## 2016-12-26 ENCOUNTER — Other Ambulatory Visit: Payer: Self-pay | Admitting: Family Medicine

## 2016-12-26 NOTE — Telephone Encounter (Signed)
Last Cr and -lytes reviewed; Rx approved

## 2017-02-20 ENCOUNTER — Encounter: Payer: 59 | Admitting: Family Medicine

## 2017-02-28 ENCOUNTER — Other Ambulatory Visit: Payer: Self-pay

## 2017-02-28 MED ORDER — HYDROCHLOROTHIAZIDE 25 MG PO TABS: 25.0000 mg | ORAL_TABLET | Freq: Every day | ORAL | 0 refills | Status: DC

## 2017-02-28 NOTE — Telephone Encounter (Signed)
90 day supply

## 2017-02-28 NOTE — Telephone Encounter (Signed)
Patient has an appt on 03/12/17

## 2017-03-04 ENCOUNTER — Other Ambulatory Visit: Payer: Self-pay

## 2017-03-04 MED ORDER — HYDROCHLOROTHIAZIDE 25 MG PO TABS: 25.0000 mg | ORAL_TABLET | Freq: Every day | ORAL | 0 refills | Status: DC

## 2017-03-04 NOTE — Telephone Encounter (Signed)
Patient requesting 90 day supply.

## 2017-03-12 ENCOUNTER — Encounter: Payer: Self-pay | Admitting: Family Medicine

## 2017-03-12 ENCOUNTER — Ambulatory Visit (INDEPENDENT_AMBULATORY_CARE_PROVIDER_SITE_OTHER): Payer: 59 | Admitting: Family Medicine

## 2017-03-12 VITALS — BP 160/80 | HR 66 | Temp 98.4°F | Resp 16 | Ht 66.0 in | Wt 200.8 lb

## 2017-03-12 DIAGNOSIS — K5909 Other constipation: Secondary | ICD-10-CM | POA: Diagnosis not present

## 2017-03-12 DIAGNOSIS — Z0000 Encounter for general adult medical examination without abnormal findings: Secondary | ICD-10-CM | POA: Diagnosis not present

## 2017-03-12 DIAGNOSIS — E66811 Obesity, class 1: Secondary | ICD-10-CM

## 2017-03-12 DIAGNOSIS — E6609 Other obesity due to excess calories: Secondary | ICD-10-CM

## 2017-03-12 DIAGNOSIS — E785 Hyperlipidemia, unspecified: Secondary | ICD-10-CM

## 2017-03-12 DIAGNOSIS — Z1231 Encounter for screening mammogram for malignant neoplasm of breast: Secondary | ICD-10-CM

## 2017-03-12 DIAGNOSIS — R195 Other fecal abnormalities: Secondary | ICD-10-CM | POA: Diagnosis not present

## 2017-03-12 DIAGNOSIS — N3941 Urge incontinence: Secondary | ICD-10-CM | POA: Diagnosis not present

## 2017-03-12 DIAGNOSIS — I129 Hypertensive chronic kidney disease with stage 1 through stage 4 chronic kidney disease, or unspecified chronic kidney disease: Secondary | ICD-10-CM

## 2017-03-12 DIAGNOSIS — Z114 Encounter for screening for human immunodeficiency virus [HIV]: Secondary | ICD-10-CM

## 2017-03-12 DIAGNOSIS — E559 Vitamin D deficiency, unspecified: Secondary | ICD-10-CM

## 2017-03-12 DIAGNOSIS — R7301 Impaired fasting glucose: Secondary | ICD-10-CM

## 2017-03-12 DIAGNOSIS — Z6832 Body mass index (BMI) 32.0-32.9, adult: Secondary | ICD-10-CM | POA: Diagnosis not present

## 2017-03-12 DIAGNOSIS — Z1239 Encounter for other screening for malignant neoplasm of breast: Secondary | ICD-10-CM

## 2017-03-12 MED ORDER — POLYETHYLENE GLYCOL 3350 17 GM/SCOOP PO POWD: 1.0000 | Freq: Once | ORAL | 0 refills | Status: AC

## 2017-03-12 NOTE — Patient Instructions (Addendum)
Kegel Exercises Kegel exercises help strengthen the muscles that support the rectum, vagina, small intestine, bladder, and uterus. Doing Kegel exercises can help:  Improve bladder and bowel control.  Improve sexual response.  Reduce problems and discomfort during pregnancy.  Kegel exercises involve squeezing your pelvic floor muscles, which are the same muscles you squeeze when you try to stop the flow of urine. The exercises can be done while sitting, standing, or lying down, but it is best to vary your position. Phase 1 exercises 1. Squeeze your pelvic floor muscles tight. You should feel a tight lift in your rectal area. If you are a female, you should also feel a tightness in your vaginal area. Keep your stomach, buttocks, and legs relaxed. 2. Hold the muscles tight for up to 10 seconds. 3. Relax your muscles. Repeat this exercise 50 times a day or as many times as told by your health care provider. Continue to do this exercise for at least 4-6 weeks or for as long as told by your health care provider. This information is not intended to replace advice given to you by your health care provider. Make sure you discuss any questions you have with your health care provider. Document Released: 04/15/2012 Document Revised: 12/23/2015 Document Reviewed: 03/19/2015 Elsevier Interactive Patient Education  2018 [place] Years, Female Preventive care refers to lifestyle choices and visits with your health care provider that can promote health and wellness. What does preventive care include?  A yearly physical exam. This is also called an annual well check.  Dental exams once or twice a year.  Routine eye exams. Ask your health care provider how often you should have your eyes checked.  Personal lifestyle choices, including: ? Daily care of your teeth and gums. ? Regular physical activity. ? Eating a healthy diet. ? Avoiding tobacco and drug use. ? Limiting  alcohol use. ? Practicing safe sex. ? Taking low-dose aspirin daily starting at age 56. ? Taking vitamin and mineral supplements as recommended by your health care provider. What happens during an annual well check? The services and screenings done by your health care provider during your annual well check will depend on your age, overall health, lifestyle risk factors, and family history of disease. Counseling Your health care provider may ask you questions about your:  Alcohol use.  Tobacco use.  Drug use.  Emotional well-being.  Home and relationship well-being.  Sexual activity.  Eating habits.  Work and work Statistician.  Method of birth control.  Menstrual cycle.  Pregnancy history.  Screening You may have the following tests or measurements:  Height, weight, and BMI.  Blood pressure.  Lipid and cholesterol levels. These may be checked every 5 years, or more frequently if you are over 62 years old.  Skin check.  Lung cancer screening. You may have this screening every year starting at age 37 if you have a 30-pack-year history of smoking and currently smoke or have quit within the past 15 years.  Fecal occult blood test (FOBT) of the stool. You may have this test every year starting at age 76.  Flexible sigmoidoscopy or colonoscopy. You may have a sigmoidoscopy every 5 years or a colonoscopy every 10 years starting at age 34.  Hepatitis C blood test.  Hepatitis B blood test.  Sexually transmitted disease (STD) testing.  Diabetes screening. This is done by checking your blood sugar (glucose) after you have not eaten for a while (fasting). You may have this  done every 1-3 years.  Mammogram. This may be done every 1-2 years. Talk to your health care provider about when you should start having regular mammograms. This may depend on whether you have a family history of breast cancer.  BRCA-related cancer screening. This may be done if you have a family  history of breast, ovarian, tubal, or peritoneal cancers.  Pelvic exam and Pap test. This may be done every 3 years starting at age 71. Starting at age 45, this may be done every 5 years if you have a Pap test in combination with an HPV test.  Bone density scan. This is done to screen for osteoporosis. You may have this scan if you are at high risk for osteoporosis.  Discuss your test results, treatment options, and if necessary, the need for more tests with your health care provider. Vaccines Your health care provider may recommend certain vaccines, such as:  Influenza vaccine. This is recommended every year.  Tetanus, diphtheria, and acellular pertussis (Tdap, Td) vaccine. You may need a Td booster every 10 years.  Varicella vaccine. You may need this if you have not been vaccinated.  Zoster vaccine. You may need this after age 27.  Measles, mumps, and rubella (MMR) vaccine. You may need at least one dose of MMR if you were born in 1957 or later. You may also need a second dose.  Pneumococcal 13-valent conjugate (PCV13) vaccine. You may need this if you have certain conditions and were not previously vaccinated.  Pneumococcal polysaccharide (PPSV23) vaccine. You may need one or two doses if you smoke cigarettes or if you have certain conditions.  Meningococcal vaccine. You may need this if you have certain conditions.  Hepatitis A vaccine. You may need this if you have certain conditions or if you travel or work in places where you may be exposed to hepatitis A.  Hepatitis B vaccine. You may need this if you have certain conditions or if you travel or work in places where you may be exposed to hepatitis B.  Haemophilus influenzae type b (Hib) vaccine. You may need this if you have certain conditions.  Talk to your health care provider about which screenings and vaccines you need and how often you need them. This information is not intended to replace advice given to you by your  health care provider. Make sure you discuss any questions you have with your health care provider. Document Released: 05/26/2015 Document Revised: 01/17/2016 Document Reviewed: 02/28/2015 Elsevier Interactive Patient Education  2017 [employer].

## 2017-03-12 NOTE — Progress Notes (Addendum)
Name: [name]   MRN: [id number]    DOB: 10/19/1955   Date:03/12/2017       Progress Note  Subjective  Chief Complaint  Chief Complaint  Patient presents with  . Annual Exam    HPI  Patient presents for annual CPE and the following concerns:  Has prediabetes and a history of pituitary tumor, she sees Dr. Loney Carr with [facility] who manages both. She is prescribed metformin 532m BID, but she has not been taking daily because it causes her diarrhea.  She would like to wait for her appointment with Dr. CLoney Carr have DM labs performed - last A1C 07/05/2016 was 6.2.  Constipation: Has been her entire life that she has dealt with constipation, she notes that the metformin helps her to have BM's.  She develops bloating, cramping, and constipation quickly when eating meals.  She would like to address this today.  She has never used miralax or metamucil.  She has noticed one day of dark stool 2 days ago, but otherwise no frank blood or tarry stools. Colonoscopy performed 2016, not due until 2026  Diet: Lives alone and hates to cook.  Breakfast - skips; lunch/dinner - vegetables, likes to eat at [employer], [employer], and other "home cooking" restaurants.  Snacks - cookies, ice cream, chips, etc. Exercise: Walks every day with her sister for 657ms.  USPSTF grade A and B recommendations  Depression:  [facility] 02/19/2016 08/25/2015 02/16/2015  Decreased Interest 0 0 0  Down, Depressed, Hopeless 0 1 0  PHQ - 2 Score 0 1 0   Hypertension:  Only taking HCTZ off and on, she has not been taking faithfully. BP Readings from Last 3 Encounters:  03/12/17 (!) 160/80  02/19/16 122/84  09/22/15 118/84   Obesity: Taking Metformin occasionally - when she remembers Wt Readings from Last 3 Encounters:  03/12/17 200 lb 12.8 oz (91.1 kg)  02/19/16 205 lb (93 kg)  09/22/15 207 lb 12.8 oz (94.3 kg)   BMI Readings from Last 3 Encounters:  03/12/17 32.41 kg/m  02/19/16 32.79 kg/m  09/22/15  33.24 kg/m    Alcohol: Seldom - once every 2-3 months she may have one drink Tobacco use: Never user; does play bingo and goes to [employer] has second hand exposure there; denies cough, shortness of breath, or chest pain HIV: Negative 2016, would like to repeat today; Hep C and B negative in 2016 STD testing and prevention (chl/gon/syphilis): Declines today. Intimate partner violence: Denies concerns Sexual History/Pain during Intercourse: Denies pain with intercourse; 1 partner in the last 6 months. Menstrual History/LMP/Abnormal Bleeding: s/p hysterectomy; no vaginal bleeding Incontinence Symptoms: Urge Incontinence - when she wakes up in the morning she has to go straight to the bathroom or else she will void on herself; worse when she drinks a lot of water the day before.  If she triea to hold at all, she will void a small amount. She declines Pelvic floor PT or urology referral today, but is willing to try kegel exercises at home.  Advanced Care Planning: A voluntary discussion about advance care planning including the explanation and discussion of advance directives.  Discussed health care proxy and Living will, and the patient was able to identify a health care proxy as Son, [name] Patient does not have a living will at present time. If patient does have living will, I have requested they bring this to the clinic to be scanned in to their chart.  Breast cancer:  Will order mammo today; no family history Breast CA. No results found for: HMMAMMO  BRCA gene screening: Doesn't qualify Cervical cancer screening: S/P hysterectomy, no longer having Paps performed.  Osteoporosis: No family history, will wait for 62yo per USPSTF guidelines. No results found for: HMDEXASCAN  Fall prevention: Good lighting handrails on stairwells, no loose throw rugs Lipids: We will check today Lab Results  Component Value Date   CHOL 155 02/06/2016   CHOL 178 02/16/2015   Lab Results  Component  Value Date   HDL 48 02/06/2016   HDL 49 02/16/2015   Lab Results  Component Value Date   LDLCALC 92 02/06/2016   LDLCALC 114 (H) 02/16/2015   Lab Results  Component Value Date   TRIG 76 02/06/2016   TRIG 75 02/16/2015   No results found for: CHOLHDL No results found for: LDLDIRECT  Glucose:  Glucose  Date Value Ref Range Status  02/06/2016 105 (H) 65 - 99 mg/dL Final  02/16/2015 95 65 - 99 mg/dL Final    Skin cancer: No concerning moles or lesions. Colorectal cancer: UTD - see discussion above regarding bowel changes.  Patient Active Problem List   Diagnosis Date Noted  . Low back pain 02/19/2016  . Dysfunction of left eustachian tube 10/01/2015  . Vitamin D deficiency 10/01/2015  . Medication monitoring encounter 10/01/2015  . Thrombocytosis ([place]) 10/01/2015  . Alkaline phosphatase elevation 10/01/2015  . Perforated nasal septum 08/25/2015  . Preventative health care 02/21/2015  . Obesity 02/21/2015  . Breast cancer screening 02/16/2015  . Depression   . GERD (gastroesophageal reflux disease)   . Osteoarthritis   . Benign hypertensive renal disease   . Hyperlipidemia   . Sleep apnea   . DDD (degenerative disc disease), lumbar   . Insomnia   . Impaired fasting glucose   . Recurrent major depression ([address])   . Migraine     Past Surgical History:  Procedure Laterality Date  . ABDOMINAL HYSTERECTOMY     partial with bladder suspension, no cervix  . CHOLECYSTECTOMY  2003  . COLONOSCOPY  2008   1 polyp- return 10 years  . KNEE SURGERY Right 2014  . PITUITARY SURGERY     x2 30+ years ago  . transnasal approach pituitary adenoma resection  2015   clival/parasellar mass    Family History  Problem Relation Age of Onset  . Diabetes Mother   . Hypertension Mother   . Cancer Mother        possible liver  . Heart disease Father   . Hypertension Father   . Heart attack Father   . Alzheimer's disease Father   . Diabetes Sister   . Hypertension Sister   .  Cancer Sister        lung?  . Stroke Sister   . Atrial fibrillation Sister   . Breast cancer Maternal Grandmother     Social History   Social History  . Marital status: Divorced    Spouse name: N/A  . Number of children: N/A  . Years of education: N/A   Occupational History  . Not on file.   Social History Main Topics  . Smoking status: Never Smoker  . Smokeless tobacco: Never Used  . Alcohol use No     Comment: rare  . Drug use: No  . Sexual activity: Not on file   Other Topics Concern  . Not on file   Social History Narrative  . No narrative on file  Current Outpatient Prescriptions:  .  fluticasone (FLONASE) 50 MCG/ACT nasal spray, Place 2 sprays into both nostrils daily. (Patient not taking: Reported on 03/12/2017), Disp: 16 g, Rfl: 6 .  hydrochlorothiazide (HYDRODIURIL) 25 MG tablet, Take 1 tablet (25 mg total) by mouth daily. (Patient not taking: Reported on 03/12/2017), Disp: 90 tablet, Rfl: 0 .  ipratropium (ATROVENT) 0.03 % nasal spray, SPRAY 2 SPRAYS IN EACH NOSTRIL 2-3 TIMES A DAY (Patient not taking: Reported on 03/12/2017), Disp: 30 mL, Rfl: 11 .  metFORMIN (GLUCOPHAGE) 500 MG tablet, Take 500 mg by mouth 2 (two) times daily., Disp: , Rfl: 3  Allergies  Allergen Reactions  . Lisinopril Other (See Comments)    cough  . Sulfa Antibiotics      ROS  Constitutional: Negative for fever or weight change.  Respiratory: Negative for cough and shortness of breath.   Cardiovascular: Negative for chest pain or palpitations.  Gastrointestinal: Negative for abdominal pain, see HPI Musculoskeletal: Negative for gait problem or joint swelling.  Skin: Negative for rash.  Neurological: Negative for dizziness or headache.  No other specific complaints in a complete review of systems (except as listed in HPI above).  Objective  Vitals:   03/12/17 1053  BP: (!) 160/80  Pulse: 66  Resp: 16  Temp: 98.4 F (36.9 C)  TempSrc: Oral  SpO2: 98%  Weight: 200  lb 12.8 oz (91.1 kg)  Height: 5' 6" (1.676 m)    Body mass index is 32.41 kg/m.  Physical Exam  Constitutional: Patient appears well-developed and well-nourished. No distress.  HENT: Head: Normocephalic and atraumatic. Ears: B TMs ok, no erythema or effusion; Nose: Nose normal. Mouth/Throat: Oropharynx is clear and moist. No oropharyngeal exudate.  Eyes: Conjunctivae and EOM are normal. Pupils are equal, round, and reactive to light. No scleral icterus.  Neck: Normal range of motion. Neck supple. No JVD present. No thyromegaly present.  Cardiovascular: Normal rate, regular rhythm and normal heart sounds.  No murmur heard. No BLE edema. Pulmonary/Chest: Effort normal and breath sounds normal. No respiratory distress. Abdominal: Soft. Bowel sounds are normal, no distension. There is no tenderness. no masses Breast: no lumps or masses, no nipple discharge or rashes FEMALE GENITALIA: deferred. Musculoskeletal: Normal range of motion, no joint effusions. No gross deformities Neurological: he is alert and oriented to person, place, and time. No cranial nerve deficit. Coordination, balance, strength, speech and gait are normal.  Skin: Skin is warm and dry. No rash noted. No erythema.  Psychiatric: Patient has a normal mood and affect. behavior is normal. Judgment and thought content normal.   No results found for this or any previous visit (from the past 2160 hour(s)).  PHQ2/9: Depression screen [facility] 2/9 02/19/2016 08/25/2015 02/16/2015  Decreased Interest 0 0 0  Down, Depressed, Hopeless 0 1 0  PHQ - 2 Score 0 1 0   Fall Risk: Fall Risk  02/19/2016 08/25/2015  Falls in the past year? No No   Assessment & Plan  1. Well woman exam (no gynecological exam) -USPSTF grade A and B recommendations reviewed with patient; age-appropriate recommendations, preventive care, screening tests, etc discussed and encouraged; healthy living encouraged; see AVS for patient education given to  patient -Discussed importance of 150 minutes of physical activity weekly, eat two servings of fish weekly, eat one serving of tree nuts ( cashews, pistachios, pecans, almonds.Marland Kitchen) every other day, eat 6 servings of fruit/vegetables daily and drink plenty of water and avoid sweet beverages.   2. Chronic constipation -  polyethylene glycol powder (GLYCOLAX/MIRALAX) powder; Take 255 g by mouth once.  Dispense: 255 g; Refill: 0 - Occult blood card to lab, stool - Advised to eat plenty of vegetables, drink plenty of water, monitor stools, speak with endocrinologist regarding Metformin - may consider changing to ER formulation to decrease SE of diarrhea. - She declines referral to GI today, but is willing to have the above labs performed. Will consider referral in the future if not improving.  3. Dark stools - Occult blood card to lab, stool - CBC - Discussed red flags including persistent dark and tarry stools, bright red blood in stool, changes in bowels - She declines referral to GI today, but is willing to have the above labs performed. Will consider referral in the future if not improving. - Colonoscopy UTD  4. Benign hypertensive renal disease - COMPLETE METABOLIC PANEL WITH GFR - Advised to restart HCTZ and return in 1-2 weeks for BP check  5. Breast cancer screening - MM DIGITAL SCREENING BILATERAL; Future  6. Hyperlipidemia, unspecified hyperlipidemia type - Lipid panel  7. Vitamin D deficiency - VITAMIN D 25 Hydroxy (Vit-D Deficiency, Fractures)  8. Impaired fasting glucose - COMPLETE METABOLIC PANEL WITH GFR - Managed by Dr. Loney Carr, would like to await her f/u with him for A1C testing.  9. Encounter for screening for HIV - HIV antibody  10. Class 1 obesity due to excess calories with serious comorbidity and body mass index (BMI) of 32.0 to 32.9 in adult - COMPLETE METABOLIC PANEL WITH GFR - Advised on healthy diet and exercise - continue walking, work on eating more fresh  fruits and vegetables and eating out less. - Advised obesity can contribute to her prediabetes and HTN.  11. Urge incontinence of urine Discussed option of pelvic floor PT, pt declines and would like to try kegel exercises at home first - these are provided to her and she will call back if she decides to accept referral to PT.

## 2017-03-13 ENCOUNTER — Other Ambulatory Visit: Payer: Self-pay | Admitting: Family Medicine

## 2017-03-13 ENCOUNTER — Telehealth: Payer: Self-pay | Admitting: Emergency Medicine

## 2017-03-13 DIAGNOSIS — Z1212 Encounter for screening for malignant neoplasm of rectum: Principal | ICD-10-CM

## 2017-03-13 DIAGNOSIS — D649 Anemia, unspecified: Secondary | ICD-10-CM

## 2017-03-13 DIAGNOSIS — E559 Vitamin D deficiency, unspecified: Secondary | ICD-10-CM

## 2017-03-13 DIAGNOSIS — Z1211 Encounter for screening for malignant neoplasm of colon: Secondary | ICD-10-CM

## 2017-03-13 DIAGNOSIS — E785 Hyperlipidemia, unspecified: Secondary | ICD-10-CM

## 2017-03-13 LAB — COMPLETE METABOLIC PANEL WITH GFR
AG Ratio: 1.2 (calc) (ref 1.0–2.5)
ALT: 11 U/L (ref 6–29)
AST: 13 U/L (ref 10–35)
Albumin: 4 g/dL (ref 3.6–5.1)
Alkaline phosphatase (APISO): 119 U/L (ref 33–130)
BUN: 10 mg/dL (ref 7–25)
CO2: 28 mmol/L (ref 20–32)
Calcium: 9.7 mg/dL (ref 8.6–10.4)
Chloride: 107 mmol/L (ref 98–110)
Creat: 0.8 mg/dL (ref 0.50–0.99)
GFR, Est African American: 92 mL/min/{1.73_m2} (ref >=60)
GFR, Est Non African American: 79 mL/min/{1.73_m2} (ref >=60)
Globulin: 3.3 g/dL (calc) (ref 1.9–3.7)
Glucose, Bld: 90 mg/dL (ref 65–99)
Potassium: 4.1 mmol/L (ref 3.5–5.3)
Sodium: 142 mmol/L (ref 135–146)
Total Bilirubin: 0.3 mg/dL (ref 0.2–1.2)
Total Protein: 7.3 g/dL (ref 6.1–8.1)

## 2017-03-13 LAB — CBC
HCT: 35.6 % (ref 35.0–45.0)
Hemoglobin: 11.5 g/dL — ABNORMAL LOW (ref 11.7–15.5)
MCH: 25.7 pg — ABNORMAL LOW (ref 27.0–33.0)
MCHC: 32.3 g/dL (ref 32.0–36.0)
MCV: 79.6 fL — ABNORMAL LOW (ref 80.0–100.0)
MPV: 10.2 fL (ref 7.5–12.5)
Platelets: 370 10*3/uL (ref 140–400)
RBC: 4.47 10*6/uL (ref 3.80–5.10)
RDW: 14.4 % (ref 11.0–15.0)
WBC: 4.1 10*3/uL (ref 3.8–10.8)

## 2017-03-13 LAB — LIPID PANEL
Cholesterol: 194 mg/dL (ref <200)
HDL: 58 mg/dL (ref >50)
LDL Cholesterol (Calc): 118 mg/dL (calc) — ABNORMAL HIGH
Non-HDL Cholesterol (Calc): 136 mg/dL (calc) — ABNORMAL HIGH (ref <130)
Total CHOL/HDL Ratio: 3.3 (calc) (ref <5.0)
Triglycerides: 85 mg/dL (ref <150)

## 2017-03-13 LAB — IRON,TIBC AND FERRITIN PANEL
%SAT: 12 % (calc) (ref 11–50)
Ferritin: 29 ng/mL (ref 20–288)
Iron: 39 ug/dL — ABNORMAL LOW (ref 45–160)
TIBC: 317 mcg/dL (calc) (ref 250–450)

## 2017-03-13 LAB — TEST AUTHORIZATION

## 2017-03-13 LAB — HIV ANTIBODY (ROUTINE TESTING W REFLEX): HIV 1&2 Ab, 4th Generation: NONREACTIVE

## 2017-03-13 LAB — VITAMIN D 25 HYDROXY (VIT D DEFICIENCY, FRACTURES): Vit D, 25-Hydroxy: 20 ng/mL — ABNORMAL LOW (ref 30–100)

## 2017-03-13 MED ORDER — ATORVASTATIN CALCIUM 20 MG PO TABS: 20.0000 mg | ORAL_TABLET | Freq: Every day | ORAL | 0 refills | Status: DC

## 2017-03-13 MED ORDER — VITAMIN D (ERGOCALCIFEROL) 1.25 MG (50000 UNIT) PO CAPS
50000.0000 [IU] | ORAL_CAPSULE | ORAL | 0 refills | Status: DC
Start: 2017-03-13 — End: 2017-07-25

## 2017-03-17 ENCOUNTER — Other Ambulatory Visit: Payer: Self-pay | Admitting: Family Medicine

## 2017-03-24 NOTE — Telephone Encounter (Signed)
-----   Message from Hubbard Hartshorn, [place] sent at 03/13/2017  8:02 AM EDT ----- Regarding: Hemoccult Cards Reminder Please call patient and remind her to bring her hemoccult cards with her to her BP check on 03/26/2017

## 2017-03-26 ENCOUNTER — Ambulatory Visit: Payer: 59

## 2017-03-26 NOTE — Telephone Encounter (Signed)
Patient notified

## 2017-03-27 ENCOUNTER — Ambulatory Visit: Payer: 59

## 2017-03-31 ENCOUNTER — Telehealth: Payer: Self-pay

## 2017-03-31 ENCOUNTER — Ambulatory Visit: Payer: 59

## 2017-03-31 ENCOUNTER — Ambulatory Visit
Admission: RE | Admit: 2017-03-31 | Discharge: 2017-03-31 | Disposition: A | Discharge status: Home / Self Care | Payer: 59 | Source: Ambulatory Visit | Attending: Family Medicine | Admitting: Family Medicine

## 2017-03-31 VITALS — BP 140/100 | HR 64

## 2017-03-31 DIAGNOSIS — Z1239 Encounter for other screening for malignant neoplasm of breast: Secondary | ICD-10-CM

## 2017-03-31 DIAGNOSIS — Z1231 Encounter for screening mammogram for malignant neoplasm of breast: Secondary | ICD-10-CM | POA: Insufficient documentation

## 2017-03-31 DIAGNOSIS — E538 Deficiency of other specified B group vitamins: Secondary | ICD-10-CM

## 2017-03-31 MED ORDER — CYANOCOBALAMIN 1000 MCG/ML IJ SOLN: 1000.0000 ug | Freq: Once | INTRAMUSCULAR | Status: DC

## 2017-03-31 NOTE — Telephone Encounter (Signed)
Pt calls and states that she is concerned because recently she discovered that there is mold in her home, pt is unsure of how long it has been there just noticed it this past week with heavy rains. Pt denies any respitatory sx (cough, trouble breathing ect.) but states she thought maybe in was increasing her BP. Advised pt that it did not seem likely to me that the two would be related (other than the stress of knowing it there and the need for repair) who I told her that I would inform provider so that we could get an accurate answer. Please advise. Thanks!

## 2017-03-31 NOTE — Telephone Encounter (Signed)
Called pt informed her of Dr.Lada's message below. Pt gave verbal understanding.

## 2017-03-31 NOTE — Telephone Encounter (Signed)
Very good question I don't think the mold per se would increase the blood pressure, but agree with your thought that the stress of it might be having an impact on her BP Thank her for asking about it and posing question to me

## 2017-03-31 NOTE — Patient Instructions (Addendum)
Dr.Lada would like to invite you back for a visit with her in 2-3wks. Please check your BP 3 times per week and call if over 140/90. Continue HCTZ. Please stay away from decongestants.   DASH Eating Plan DASH stands for "Dietary Approaches to Stop Hypertension." The DASH eating plan is a healthy eating plan that has been shown to reduce high blood pressure (hypertension). It may also reduce your risk for type 2 diabetes, heart disease, and stroke. The DASH eating plan may also help with weight loss. What are tips for following this plan? General guidelines  Avoid eating more than 2,300 mg (milligrams) of salt (sodium) a day. If you have hypertension, you may need to reduce your sodium intake to 1,500 mg a day.  Limit alcohol intake to no more than 1 drink a day for nonpregnant women and 2 drinks a day for men. One drink equals 12 oz of beer, 5 oz of wine, or 1 oz of hard liquor.  Work with your health care provider to maintain a healthy body weight or to lose weight. Ask what an ideal weight is for you.  Get at least 30 minutes of exercise that causes your heart to beat faster (aerobic exercise) most days of the week. Activities may include walking, swimming, or biking.  Work with your health care provider or diet and nutrition specialist (dietitian) to adjust your eating plan to your individual calorie needs. Reading food labels  Check food labels for the amount of sodium per serving. Choose foods with less than 5 percent of the Daily Value of sodium. Generally, foods with less than 300 mg of sodium per serving fit into this eating plan.  To find whole grains, look for the word "whole" as the first word in the ingredient list. Shopping  Buy products labeled as "low-sodium" or "no salt added."  Buy fresh foods. Avoid canned foods and premade or frozen meals. Cooking  Avoid adding salt when cooking. Use salt-free seasonings or herbs instead of table salt or sea salt. Check with your  health care provider or pharmacist before using salt substitutes.  Do not fry foods. Cook foods using healthy methods such as baking, boiling, grilling, and broiling instead.  Cook with heart-healthy oils, such as olive, canola, soybean, or sunflower oil. Meal planning   Eat a balanced diet that includes: ? 5 or more servings of fruits and vegetables each day. At each meal, try to fill half of your plate with fruits and vegetables. ? Up to 6-8 servings of whole grains each day. ? Less than 6 oz of lean meat, poultry, or fish each day. A 3-oz serving of meat is about the same size as a deck of cards. One egg equals 1 oz. ? 2 servings of low-fat dairy each day. ? A serving of nuts, seeds, or beans 5 times each week. ? Heart-healthy fats. Healthy fats called Omega-3 fatty acids are found in foods such as flaxseeds and coldwater fish, like sardines, salmon, and mackerel.  Limit how much you eat of the following: ? Canned or prepackaged foods. ? Food that is high in trans fat, such as fried foods. ? Food that is high in saturated fat, such as fatty meat. ? Sweets, desserts, sugary drinks, and other foods with added sugar. ? Full-fat dairy products.  Do not salt foods before eating.  Try to eat at least 2 vegetarian meals each week.  Eat more home-cooked food and less restaurant, buffet, and fast food.  When eating  at a restaurant, ask that your food be prepared with less salt or no salt, if possible. What foods are recommended? The items listed may not be a complete list. Talk with your dietitian about what dietary choices are best for you. Grains Whole-grain or whole-wheat bread. Whole-grain or whole-wheat pasta. Brown rice. [place]. Bulgur. Whole-grain and low-sodium cereals. Pita bread. Low-fat, low-sodium crackers. Whole-wheat flour tortillas. Vegetables Fresh or frozen vegetables (raw, steamed, roasted, or grilled). Low-sodium or reduced-sodium tomato and vegetable juice.  Low-sodium or reduced-sodium tomato sauce and tomato paste. Low-sodium or reduced-sodium canned vegetables. Fruits All fresh, dried, or frozen fruit. Canned fruit in natural juice (without added sugar). Meat and other protein foods Skinless chicken or [place]. Ground chicken or [place]. Pork with fat trimmed off. Fish and seafood. Egg whites. Dried beans, peas, or lentils. Unsalted nuts, nut butters, and seeds. Unsalted canned beans. Lean cuts of beef with fat trimmed off. Low-sodium, lean deli meat. Dairy Low-fat (1%) or fat-free (skim) milk. Fat-free, low-fat, or reduced-fat cheeses. Nonfat, low-sodium ricotta or cottage cheese. Low-fat or nonfat yogurt. Low-fat, low-sodium cheese. Fats and oils Soft margarine without trans fats. Vegetable oil. Low-fat, reduced-fat, or light mayonnaise and salad dressings (reduced-sodium). Canola, safflower, olive, soybean, and sunflower oils. Avocado. Seasoning and other foods Herbs. Spices. Seasoning mixes without salt. Unsalted popcorn and pretzels. Fat-free sweets. What foods are not recommended? The items listed may not be a complete list. Talk with your dietitian about what dietary choices are best for you. Grains Baked goods made with fat, such as croissants, muffins, or some breads. Dry pasta or rice meal packs. Vegetables Creamed or fried vegetables. Vegetables in a cheese sauce. Regular canned vegetables (not low-sodium or reduced-sodium). Regular canned tomato sauce and paste (not low-sodium or reduced-sodium). Regular tomato and vegetable juice (not low-sodium or reduced-sodium). Angie Fava. Olives. Fruits Canned fruit in a light or heavy syrup. Fried fruit. Fruit in cream or butter sauce. Meat and other protein foods Fatty cuts of meat. Ribs. Fried meat. Berniece Salines. Sausage. Bologna and other processed lunch meats. Salami. Fatback. Hotdogs. Bratwurst. Salted nuts and seeds. Canned beans with added salt. Canned or smoked fish. Whole eggs or egg yolks. Chicken  or [place] with skin. Dairy Whole or 2% milk, cream, and half-and-half. Whole or full-fat cream cheese. Whole-fat or sweetened yogurt. Full-fat cheese. Nondairy creamers. Whipped toppings. Processed cheese and cheese spreads. Fats and oils Butter. Stick margarine. Lard. Shortening. Ghee. Bacon fat. Tropical oils, such as coconut, palm kernel, or palm oil. Seasoning and other foods Salted popcorn and pretzels. Onion salt, garlic salt, seasoned salt, table salt, and sea salt. Worcestershire sauce. Tartar sauce. Barbecue sauce. Teriyaki sauce. Soy sauce, including reduced-sodium. Steak sauce. Canned and packaged gravies. Fish sauce. Oyster sauce. Cocktail sauce. Horseradish that you find on the shelf. Ketchup. Mustard. Meat flavorings and tenderizers. Bouillon cubes. Hot sauce and Tabasco sauce. Premade or packaged marinades. Premade or packaged taco seasonings. Relishes. Regular salad dressings. Where to find more information:  National Heart, Lung, and [place]: https://wilson-eaton.com/  American Heart Association: www.heart.org Summary  The DASH eating plan is a healthy eating plan that has been shown to reduce high blood pressure (hypertension). It may also reduce your risk for type 2 diabetes, heart disease, and stroke.  With the DASH eating plan, you should limit salt (sodium) intake to 2,300 mg a day. If you have hypertension, you may need to reduce your sodium intake to 1,500 mg a day.  When on the DASH eating plan, aim to  eat more fresh fruits and vegetables, whole grains, lean proteins, low-fat dairy, and heart-healthy fats.  Work with your health care provider or diet and nutrition specialist (dietitian) to adjust your eating plan to your individual calorie needs. This information is not intended to replace advice given to you by your health care provider. Make sure you discuss any questions you have with your health care provider. Document Released: 04/18/2011 Document Revised:  04/22/2016 Document Reviewed: 04/22/2016 Elsevier Interactive Patient Education  2017 [employer].

## 2017-04-21 ENCOUNTER — Ambulatory Visit: Payer: 59 | Admitting: Family Medicine

## 2017-04-25 ENCOUNTER — Ambulatory Visit: Payer: 59 | Admitting: Family Medicine

## 2017-04-30 ENCOUNTER — Ambulatory Visit (INDEPENDENT_AMBULATORY_CARE_PROVIDER_SITE_OTHER): Payer: 59 | Admitting: Family Medicine

## 2017-04-30 ENCOUNTER — Encounter: Payer: Self-pay | Admitting: Family Medicine

## 2017-04-30 VITALS — BP 142/84 | HR 98 | Temp 97.9°F | Ht 66.0 in | Wt 206.8 lb

## 2017-04-30 DIAGNOSIS — R748 Abnormal levels of other serum enzymes: Secondary | ICD-10-CM | POA: Diagnosis not present

## 2017-04-30 DIAGNOSIS — Z5181 Encounter for therapeutic drug level monitoring: Secondary | ICD-10-CM

## 2017-04-30 DIAGNOSIS — D75839 Thrombocytosis, unspecified: Secondary | ICD-10-CM

## 2017-04-30 DIAGNOSIS — E559 Vitamin D deficiency, unspecified: Secondary | ICD-10-CM

## 2017-04-30 DIAGNOSIS — D473 Essential (hemorrhagic) thrombocythemia: Secondary | ICD-10-CM

## 2017-04-30 DIAGNOSIS — Z639 Problem related to primary support group, unspecified: Secondary | ICD-10-CM

## 2017-04-30 DIAGNOSIS — R7301 Impaired fasting glucose: Secondary | ICD-10-CM

## 2017-04-30 DIAGNOSIS — D352 Benign neoplasm of pituitary gland: Secondary | ICD-10-CM

## 2017-04-30 DIAGNOSIS — E785 Hyperlipidemia, unspecified: Secondary | ICD-10-CM | POA: Diagnosis not present

## 2017-04-30 DIAGNOSIS — F3341 Major depressive disorder, recurrent, in partial remission: Secondary | ICD-10-CM

## 2017-04-30 DIAGNOSIS — G4730 Sleep apnea, unspecified: Secondary | ICD-10-CM | POA: Diagnosis not present

## 2017-04-30 DIAGNOSIS — E220 Acromegaly and pituitary gigantism: Secondary | ICD-10-CM | POA: Diagnosis not present

## 2017-04-30 DIAGNOSIS — I129 Hypertensive chronic kidney disease with stage 1 through stage 4 chronic kidney disease, or unspecified chronic kidney disease: Secondary | ICD-10-CM | POA: Diagnosis not present

## 2017-04-30 NOTE — Assessment & Plan Note (Signed)
Check CBC 
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
## 2017-04-30 NOTE — Assessment & Plan Note (Signed)
Recheck level 

## 2017-04-30 NOTE — Assessment & Plan Note (Signed)
Not to goal today; try DASH guidelines

## 2017-04-30 NOTE — Assessment & Plan Note (Signed)
Patient will schedule her own appt to see her specialist

## 2017-04-30 NOTE — Assessment & Plan Note (Addendum)
With hx of sleep apnea, cannot use sedative hypnotics for her insomnia; patient will consider getting another sleep study, and may call at any time for that referral

## 2017-04-30 NOTE — Assessment & Plan Note (Signed)
Check lipids; continue statin 

## 2017-04-30 NOTE — Assessment & Plan Note (Signed)
Check glucose and A1c; encouraged diet low in starches and sweets

## 2017-04-30 NOTE — Assessment & Plan Note (Signed)
Monitor liver and kidneys 

## 2017-04-30 NOTE — Patient Instructions (Addendum)
Let's get labs today  Try to follow the DASH guidelines (DASH stands for Dietary Approaches to Stop Hypertension). Try to limit the sodium in your diet to no more than 1,500mg  of sodium per day. Certainly try to not exceed 2,000 mg per day at the very most. Do not add salt when cooking or at the table.  Check the sodium amount on labels when shopping, and choose items lower in sodium when given a choice. Avoid or limit foods that already contain a lot of sodium. Eat a diet rich in fruits and vegetables and whole grains, and try to lose weight if overweight or obese  Try to use PLAIN allergy medicine without the decongestant Avoid: phenylephrine, phenylpropanolamine, and pseudoephredine  You can try 3 mg of melatonin for 3 weeks at the exact same time of the evening  Do pick up the refill of the vitamin D prescription Once finished with that, take 1,000 iu of over-the-counter vitamin D3 once a day

## 2017-04-30 NOTE — Assessment & Plan Note (Signed)
PHQ reviewed today

## 2017-05-01 ENCOUNTER — Telehealth: Payer: Self-pay

## 2017-05-01 LAB — CBC WITH DIFFERENTIAL/PLATELET
Basophils Absolute: 20 cells/uL (ref 0–200)
Basophils Relative: 0.4 %
Eosinophils Absolute: 59 cells/uL (ref 15–500)
Eosinophils Relative: 1.2 %
HCT: 36.7 % (ref 35.0–45.0)
Hemoglobin: 11.7 g/dL (ref 11.7–15.5)
Lymphs Abs: 1632 cells/uL (ref 850–3900)
MCH: 25.2 pg — ABNORMAL LOW (ref 27.0–33.0)
MCHC: 31.9 g/dL — ABNORMAL LOW (ref 32.0–36.0)
MCV: 79.1 fL — ABNORMAL LOW (ref 80.0–100.0)
MPV: 10.3 fL (ref 7.5–12.5)
Monocytes Relative: 12.8 %
Neutro Abs: 2563 cells/uL (ref 1500–7800)
Neutrophils Relative %: 52.3 %
Platelets: 362 10*3/uL (ref 140–400)
RBC: 4.64 10*6/uL (ref 3.80–5.10)
RDW: 14.4 % (ref 11.0–15.0)
Total Lymphocyte: 33.3 %
WBC mixed population: 627 cells/uL (ref 200–950)
WBC: 4.9 10*3/uL (ref 3.8–10.8)

## 2017-05-01 LAB — COMPLETE METABOLIC PANEL WITH GFR
AG Ratio: 1.3 (calc) (ref 1.0–2.5)
ALT: 14 U/L (ref 6–29)
AST: 14 U/L (ref 10–35)
Albumin: 4 g/dL (ref 3.6–5.1)
Alkaline phosphatase (APISO): 132 U/L — ABNORMAL HIGH (ref 33–130)
BUN: 8 mg/dL (ref 7–25)
CO2: 27 mmol/L (ref 20–32)
Calcium: 9.6 mg/dL (ref 8.6–10.4)
Chloride: 106 mmol/L (ref 98–110)
Creat: 0.93 mg/dL (ref 0.50–0.99)
GFR, Est African American: 76 mL/min/{1.73_m2} (ref >=60)
GFR, Est Non African American: 66 mL/min/{1.73_m2} (ref >=60)
Globulin: 3 g/dL (calc) (ref 1.9–3.7)
Glucose, Bld: 99 mg/dL (ref 65–99)
Potassium: 3.8 mmol/L (ref 3.5–5.3)
Sodium: 141 mmol/L (ref 135–146)
Total Bilirubin: 0.3 mg/dL (ref 0.2–1.2)
Total Protein: 7 g/dL (ref 6.1–8.1)

## 2017-05-01 LAB — LIPID PANEL
Cholesterol: 171 mg/dL (ref <200)
HDL: 54 mg/dL (ref >50)
LDL Cholesterol (Calc): 98 mg/dL (calc)
Non-HDL Cholesterol (Calc): 117 mg/dL (calc) (ref <130)
Total CHOL/HDL Ratio: 3.2 (calc) (ref <5.0)
Triglycerides: 95 mg/dL (ref <150)

## 2017-05-01 LAB — HEMOGLOBIN A1C
Hgb A1c MFr Bld: 6.1 % of total Hgb — ABNORMAL HIGH (ref <5.7)
Mean Plasma Glucose: 128 (calc)
eAG (mmol/L): 7.1 (calc)

## 2017-05-01 NOTE — Telephone Encounter (Signed)
Called pt, no answer. LM for pt informing her of the information below. Advised pt to call back for questions or concerns. CRM created. Labs routed to [facility] nurse.

## 2017-05-01 NOTE — Telephone Encounter (Signed)
-----   Message from Arnetha Courser, MD sent at 05/01/2017 10:14 AM EST ----- Guerry Minors, please let patient know: 1. Her 3 month blood sugar average is better than before; try to watch her diet, walk, lose a little weight, and we'll monitor this again in 6 months 2. Her kidney function is in the normal range 3. Just one liver enzyme is literally just two points above the normal range; not worrisome by itself 4. She has brought her LDL down 20 points! Her LDL is now under 100, so keep up the good work 5. She is not anemic, but her red blood cells are a little small and pale; try to get enough iron in the diet (lentils, dark green leafy vegetables, etc.) or take an iron supplement (over-the-counter) three days a week; thank you

## 2017-05-26 ENCOUNTER — Telehealth: Payer: Self-pay

## 2017-05-26 ENCOUNTER — Other Ambulatory Visit: Payer: Self-pay

## 2017-05-26 DIAGNOSIS — Z1211 Encounter for screening for malignant neoplasm of colon: Secondary | ICD-10-CM

## 2017-05-26 NOTE — Telephone Encounter (Signed)
Gastroenterology Pre-Procedure Review  Request Date: 06/12/17 Requesting Physician: Dr. Allen Norris  PATIENT REVIEW QUESTIONS: The patient responded to the following health history questions as indicated:    1. Are you having any GI issues? no 2. Do you have a personal history of Polyps? yes (1 POLYP [place]) 3. Do you have a family history of Colon Cancer or Polyps? no 4. Diabetes Mellitus? no 5. Joint replacements in the past 12 months?no 6. Major health problems in the past 3 months?no 7. Any artificial heart valves, MVP, or defibrillator?no    MEDICATIONS & ALLERGIES:    Patient reports the following regarding taking any anticoagulation/antiplatelet therapy:   Plavix, Coumadin, Eliquis, Xarelto, Lovenox, Pradaxa, Brilinta, or Effient? no Aspirin? no  Patient confirms/reports the following medications:  Current Outpatient Medications  Medication Sig Dispense Refill  . atorvastatin (LIPITOR) 20 MG tablet Take 1 tablet (20 mg total) by mouth daily. 90 tablet 0  . fluticasone (FLONASE) 50 MCG/ACT nasal spray Place 2 sprays into both nostrils daily. (Patient taking differently: Place 2 sprays into both nostrils as needed. ) 16 g 6  . hydrochlorothiazide (HYDRODIURIL) 25 MG tablet Take 1 tablet (25 mg total) by mouth daily. 90 tablet 0  . metFORMIN (GLUCOPHAGE) 500 MG tablet Take 500 mg by mouth every other day.   3  . polyethylene glycol powder (GLYCOLAX/MIRALAX) powder TAKE 255 G BY MOUTH ONCE.  0  . Vitamin D, Ergocalciferol, (DRISDOL) 50000 units CAPS capsule Take 1 capsule (50,000 Units total) by mouth every 7 (seven) days. Once complete, take over the counter Vitamin D supplement 1000 IU daily. 8 capsule 0   No current facility-administered medications for this visit.     Patient confirms/reports the following allergies:  Allergies  Allergen Reactions  . Lisinopril Other (See Comments)    cough  . Sulfa Antibiotics     No orders of the defined types were placed in  this encounter.   AUTHORIZATION INFORMATION Primary Insurance: 1D#: Group #:  Secondary Insurance: 1D#: Group #:  SCHEDULE INFORMATION: Date:  Time: Location:

## 2017-05-29 ENCOUNTER — Other Ambulatory Visit: Payer: Self-pay | Admitting: Family Medicine

## 2017-05-29 ENCOUNTER — Ambulatory Visit: Payer: 59

## 2017-05-29 VITALS — BP 140/80

## 2017-05-29 DIAGNOSIS — E559 Vitamin D deficiency, unspecified: Secondary | ICD-10-CM

## 2017-05-29 DIAGNOSIS — Z5181 Encounter for therapeutic drug level monitoring: Secondary | ICD-10-CM

## 2017-05-29 MED ORDER — LOSARTAN POTASSIUM 50 MG PO TABS: 50.0000 mg | ORAL_TABLET | Freq: Every day | ORAL | 0 refills | Status: DC

## 2017-05-29 NOTE — Progress Notes (Signed)
STOP HCTZ Start losartan BMP and BP check in 2 weeks

## 2017-05-29 NOTE — Telephone Encounter (Signed)
Please let pt know instructions from Raelyn Ensign were to take 50k iu weekly x 8 weeks, then just 1,000 iu daily OTC Thank you

## 2017-05-29 NOTE — Telephone Encounter (Signed)
Pt informed at visit today for BP check.

## 2017-05-29 NOTE — Patient Instructions (Signed)
RTC in 2wks for BP and labs    Hypertension Hypertension, commonly called high blood pressure, is when the force of blood pumping through the arteries is too strong. The arteries are the blood vessels that carry blood from the heart throughout the body. Hypertension forces the heart to work harder to pump blood and may cause arteries to become narrow or stiff. Having untreated or uncontrolled hypertension can cause heart attacks, strokes, kidney disease, and other problems. A blood pressure reading consists of a higher number over a lower number. Ideally, your blood pressure should be below 120/80. The first ("top") number is called the systolic pressure. It is a measure of the pressure in your arteries as your heart beats. The second ("bottom") number is called the diastolic pressure. It is a measure of the pressure in your arteries as the heart relaxes. What are the causes? The cause of this condition is not known. What increases the risk? Some risk factors for high blood pressure are under your control. Others are not. Factors you can change  Smoking.  Having type 2 diabetes mellitus, high cholesterol, or both.  Not getting enough exercise or physical activity.  Being overweight.  Having too much fat, sugar, calories, or salt (sodium) in your diet.  Drinking too much alcohol. Factors that are difficult or impossible to change  Having chronic kidney disease.  Having a family history of high blood pressure.  Age. Risk increases with age.  Race. You may be at higher risk if you are African-American.  Gender. Men are at higher risk than women before age 67. After age 25, women are at higher risk than men.  Having obstructive sleep apnea.  Stress. What are the signs or symptoms? Extremely high blood pressure (hypertensive crisis) may cause:  Headache.  Anxiety.  Shortness of breath.  Nosebleed.  Nausea and vomiting.  Severe chest pain.  Jerky movements you cannot  control (seizures).  How is this diagnosed? This condition is diagnosed by measuring your blood pressure while you are seated, with your arm resting on a surface. The cuff of the blood pressure monitor will be placed directly against the skin of your upper arm at the level of your heart. It should be measured at least twice using the same arm. Certain conditions can cause a difference in blood pressure between your right and left arms. Certain factors can cause blood pressure readings to be lower or higher than normal (elevated) for a short period of time:  When your blood pressure is higher when you are in a health care provider's office than when you are at home, this is called white coat hypertension. Most people with this condition do not need medicines.  When your blood pressure is higher at home than when you are in a health care provider's office, this is called masked hypertension. Most people with this condition may need medicines to control blood pressure.  If you have a high blood pressure reading during one visit or you have normal blood pressure with other risk factors:  You may be asked to return on a different day to have your blood pressure checked again.  You may be asked to monitor your blood pressure at home for 1 week or longer.  If you are diagnosed with hypertension, you may have other blood or imaging tests to help your health care provider understand your overall risk for other conditions. How is this treated? This condition is treated by making healthy lifestyle changes, such as eating  healthy foods, exercising more, and reducing your alcohol intake. Your health care provider may prescribe medicine if lifestyle changes are not enough to get your blood pressure under control, and if:  Your systolic blood pressure is above 130.  Your diastolic blood pressure is above 80.  Your personal target blood pressure may vary depending on your medical conditions, your age, and  other factors. Follow these instructions at home: Eating and drinking  Eat a diet that is high in fiber and potassium, and low in sodium, added sugar, and fat. An example eating plan is called the DASH (Dietary Approaches to Stop Hypertension) diet. To eat this way: ? Eat plenty of fresh fruits and vegetables. Try to fill half of your plate at each meal with fruits and vegetables. ? Eat whole grains, such as whole wheat pasta, brown rice, or whole grain bread. Fill about one quarter of your plate with whole grains. ? Eat or drink low-fat dairy products, such as skim milk or low-fat yogurt. ? Avoid fatty cuts of meat, processed or cured meats, and poultry with skin. Fill about one quarter of your plate with lean proteins, such as fish, chicken without skin, beans, eggs, and tofu. ? Avoid premade and processed foods. These tend to be higher in sodium, added sugar, and fat.  Reduce your daily sodium intake. Most people with hypertension should eat less than 1,500 mg of sodium a day.  Limit alcohol intake to no more than 1 drink a day for nonpregnant women and 2 drinks a day for men. One drink equals 12 oz of beer, 5 oz of wine, or 1 oz of hard liquor. Lifestyle  Work with your health care provider to maintain a healthy body weight or to lose weight. Ask what an ideal weight is for you.  Get at least 30 minutes of exercise that causes your heart to beat faster (aerobic exercise) most days of the week. Activities may include walking, swimming, or biking.  Include exercise to strengthen your muscles (resistance exercise), such as pilates or lifting weights, as part of your weekly exercise routine. Try to do these types of exercises for 30 minutes at least 3 days a week.  Do not use any products that contain nicotine or tobacco, such as cigarettes and e-cigarettes. If you need help quitting, ask your health care provider.  Monitor your blood pressure at home as told by your health care  provider.  Keep all follow-up visits as told by your health care provider. This is important. Medicines  Take over-the-counter and prescription medicines only as told by your health care provider. Follow directions carefully. Blood pressure medicines must be taken as prescribed.  Do not skip doses of blood pressure medicine. Doing this puts you at risk for problems and can make the medicine less effective.  Ask your health care provider about side effects or reactions to medicines that you should watch for. Contact a health care provider if:  You think you are having a reaction to a medicine you are taking.  You have headaches that keep coming back (recurring).  You feel dizzy.  You have swelling in your ankles.  You have trouble with your vision. Get help right away if:  You develop a severe headache or confusion.  You have unusual weakness or numbness.  You feel faint.  You have severe pain in your chest or abdomen.  You vomit repeatedly.  You have trouble breathing. Summary  Hypertension is when the force of blood pumping through  your arteries is too strong. If this condition is not controlled, it may put you at risk for serious complications.  Your personal target blood pressure may vary depending on your medical conditions, your age, and other factors. For most people, a normal blood pressure is less than 120/80.  Hypertension is treated with lifestyle changes, medicines, or a combination of both. Lifestyle changes include weight loss, eating a healthy, low-sodium diet, exercising more, and limiting alcohol. This information is not intended to replace advice given to you by your health care provider. Make sure you discuss any questions you have with your health care provider. Document Released: 04/29/2005 Document Revised: 03/27/2016 Document Reviewed: 03/27/2016 Elsevier Interactive Patient Education  [employer].

## 2017-06-08 ENCOUNTER — Other Ambulatory Visit: Payer: Self-pay | Admitting: Family Medicine

## 2017-06-08 DIAGNOSIS — E785 Hyperlipidemia, unspecified: Secondary | ICD-10-CM

## 2017-06-13 ENCOUNTER — Ambulatory Visit: Payer: 59

## 2017-06-13 ENCOUNTER — Other Ambulatory Visit: Payer: Self-pay

## 2017-06-13 DIAGNOSIS — I10 Essential (primary) hypertension: Secondary | ICD-10-CM

## 2017-06-13 DIAGNOSIS — I129 Hypertensive chronic kidney disease with stage 1 through stage 4 chronic kidney disease, or unspecified chronic kidney disease: Secondary | ICD-10-CM

## 2017-06-13 LAB — BASIC METABOLIC PANEL
BUN: 11 mg/dL (ref 7–25)
CO2: 28 mmol/L (ref 20–32)
Calcium: 9.5 mg/dL (ref 8.6–10.4)
Chloride: 109 mmol/L (ref 98–110)
Creat: 0.83 mg/dL (ref 0.50–0.99)
Glucose, Bld: 102 mg/dL — ABNORMAL HIGH (ref 65–99)
Potassium: 4.2 mmol/L (ref 3.5–5.3)
Sodium: 141 mmol/L (ref 135–146)

## 2017-06-13 MED ORDER — LOSARTAN POTASSIUM 50 MG PO TABS: 50.0000 mg | ORAL_TABLET | Freq: Every day | ORAL | 1 refills | Status: DC

## 2017-06-13 NOTE — Telephone Encounter (Signed)
Pt states that she would like to get this rx in a 90 day supply to help her better comply. Please advise. Thanks

## 2017-06-24 ENCOUNTER — Encounter: Payer: Self-pay | Admitting: *Deleted

## 2017-06-24 ENCOUNTER — Other Ambulatory Visit: Payer: Self-pay

## 2017-06-26 NOTE — Discharge Instructions (Signed)
General Anesthesia, Adult, Care After °These instructions provide you with information about caring for yourself after your procedure. Your health care provider may also give you more specific instructions. Your treatment has been planned according to current medical practices, but problems sometimes occur. Call your health care provider if you have any problems or questions after your procedure. °What can I expect after the procedure? °After the procedure, it is common to have: °· Vomiting. °· A sore throat. °· Mental slowness. ° °It is common to feel: °· Nauseous. °· Cold or shivery. °· Sleepy. °· Tired. °· Sore or achy, even in parts of your body where you did not have surgery. ° °Follow these instructions at home: °For at least 24 hours after the procedure: °· Do not: °? Participate in activities where you could fall or become injured. °? Drive. °? Use heavy machinery. °? Drink alcohol. °? Take sleeping pills or medicines that cause drowsiness. °? Make important decisions or sign legal documents. °? Take care of children on your own. °· Rest. °Eating and drinking °· If you vomit, drink water, juice, or soup when you can drink without vomiting. °· Drink enough fluid to keep your urine clear or pale yellow. °· Make sure you have little or no nausea before eating solid foods. °· Follow the diet recommended by your health care provider. °General instructions °· Have a responsible adult stay with you until you are awake and alert. °· Return to your normal activities as told by your health care provider. Ask your health care provider what activities are safe for you. °· Take over-the-counter and prescription medicines only as told by your health care provider. °· If you smoke, do not smoke without supervision. °· Keep all follow-up visits as told by your health care provider. This is important. °Contact a health care provider if: °· You continue to have nausea or vomiting at home, and medicines are not helpful. °· You  cannot drink fluids or start eating again. °· You cannot urinate after 8-12 hours. °· You develop a skin rash. °· You have fever. °· You have increasing redness at the site of your procedure. °Get help right away if: °· You have difficulty breathing. °· You have chest pain. °· You have unexpected bleeding. °· You feel that you are having a life-threatening or urgent problem. °This information is not intended to replace advice given to you by your health care provider. Make sure you discuss any questions you have with your health care provider. °Document Released: 08/05/2000 Document Revised: 10/02/2015 Document Reviewed: 04/13/2015 °Elsevier Interactive Patient Education © 2018 [employer] ° °

## 2017-06-30 ENCOUNTER — Ambulatory Visit
Admission: RE | Admit: 2017-06-30 | Discharge: 2017-06-30 | Disposition: A | Discharge status: Home / Self Care | Payer: 59 | Source: Ambulatory Visit | Attending: Gastroenterology | Admitting: Gastroenterology

## 2017-06-30 ENCOUNTER — Ambulatory Visit: Payer: 59 | Admitting: Anesthesiology

## 2017-06-30 ENCOUNTER — Encounter
Admission: RE | Disposition: A | Discharge status: Home / Self Care | Payer: Self-pay | Source: Ambulatory Visit | Attending: Gastroenterology

## 2017-06-30 DIAGNOSIS — Z1211 Encounter for screening for malignant neoplasm of colon: Secondary | ICD-10-CM | POA: Diagnosis not present

## 2017-06-30 DIAGNOSIS — Z882 Allergy status to sulfonamides status: Secondary | ICD-10-CM | POA: Diagnosis not present

## 2017-06-30 DIAGNOSIS — M47899 Other spondylosis, site unspecified: Secondary | ICD-10-CM | POA: Insufficient documentation

## 2017-06-30 DIAGNOSIS — Z79899 Other long term (current) drug therapy: Secondary | ICD-10-CM | POA: Insufficient documentation

## 2017-06-30 DIAGNOSIS — M5136 Other intervertebral disc degeneration, lumbar region: Secondary | ICD-10-CM | POA: Insufficient documentation

## 2017-06-30 DIAGNOSIS — G4700 Insomnia, unspecified: Secondary | ICD-10-CM | POA: Diagnosis not present

## 2017-06-30 DIAGNOSIS — G4730 Sleep apnea, unspecified: Secondary | ICD-10-CM | POA: Diagnosis not present

## 2017-06-30 DIAGNOSIS — K219 Gastro-esophageal reflux disease without esophagitis: Secondary | ICD-10-CM | POA: Diagnosis not present

## 2017-06-30 DIAGNOSIS — Z7984 Long term (current) use of oral hypoglycemic drugs: Secondary | ICD-10-CM | POA: Diagnosis not present

## 2017-06-30 DIAGNOSIS — R7303 Prediabetes: Secondary | ICD-10-CM | POA: Diagnosis not present

## 2017-06-30 DIAGNOSIS — Z888 Allergy status to other drugs, medicaments and biological substances status: Secondary | ICD-10-CM | POA: Insufficient documentation

## 2017-06-30 DIAGNOSIS — I10 Essential (primary) hypertension: Secondary | ICD-10-CM | POA: Insufficient documentation

## 2017-06-30 DIAGNOSIS — E785 Hyperlipidemia, unspecified: Secondary | ICD-10-CM | POA: Insufficient documentation

## 2017-06-30 DIAGNOSIS — F329 Major depressive disorder, single episode, unspecified: Secondary | ICD-10-CM | POA: Insufficient documentation

## 2017-06-30 DIAGNOSIS — K648 Other hemorrhoids: Secondary | ICD-10-CM | POA: Diagnosis not present

## 2017-06-30 DIAGNOSIS — K5730 Diverticulosis of large intestine without perforation or abscess without bleeding: Secondary | ICD-10-CM | POA: Insufficient documentation

## 2017-06-30 HISTORY — DX: Essential (primary) hypertension: I10

## 2017-06-30 HISTORY — DX: Prediabetes: R73.03

## 2017-06-30 HISTORY — DX: Anemia, unspecified: D64.9

## 2017-06-30 HISTORY — PX: COLONOSCOPY WITH PROPOFOL: SHX5780

## 2017-06-30 HISTORY — DX: Presence of dental prosthetic device (complete) (partial): Z97.2

## 2017-06-30 SURGERY — COLONOSCOPY WITH PROPOFOL
Anesthesia: General | Site: Rectum | Wound class: Contaminated

## 2017-06-30 MED ORDER — SODIUM CHLORIDE 0.9 % IV SOLN: INTRAVENOUS | Status: DC

## 2017-06-30 MED ORDER — ACETAMINOPHEN 325 MG PO TABS: 650.0000 mg | ORAL_TABLET | Freq: Once | ORAL | Status: DC | PRN

## 2017-06-30 MED ORDER — ACETAMINOPHEN 160 MG/5ML PO SOLN: 325.0000 mg | ORAL | Status: DC | PRN

## 2017-06-30 MED ORDER — LIDOCAINE HCL (CARDIAC) 20 MG/ML IV SOLN
INTRAVENOUS | Status: DC | PRN
  Administered 2017-06-30: 40 mg via INTRAVENOUS

## 2017-06-30 MED ORDER — PROPOFOL 10 MG/ML IV BOLUS
INTRAVENOUS | Status: DC | PRN
  Administered 2017-06-30: 40 mg via INTRAVENOUS
  Administered 2017-06-30: 20 mg via INTRAVENOUS
  Administered 2017-06-30: 50 mg via INTRAVENOUS
  Administered 2017-06-30: 100 mg via INTRAVENOUS
  Administered 2017-06-30 (×3): 30 mg via INTRAVENOUS

## 2017-06-30 MED ORDER — STERILE WATER FOR IRRIGATION IR SOLN
Status: DC | PRN
  Administered 2017-06-30: .5 mL

## 2017-06-30 MED ORDER — LACTATED RINGERS IV SOLN
INTRAVENOUS | Status: DC
  Administered 2017-06-30: 09:00:00 via INTRAVENOUS

## 2017-06-30 MED ORDER — ONDANSETRON HCL 4 MG/2ML IJ SOLN: 4.0000 mg | Freq: Once | INTRAMUSCULAR | Status: DC | PRN

## 2017-06-30 SURGICAL SUPPLY — 5 items
CANISTER SUCT 1200ML W/VALVE (MISCELLANEOUS) ×2 IMPLANT
GOWN CVR UNV OPN BCK APRN NK (MISCELLANEOUS) ×2 IMPLANT
GOWN ISOL THUMB LOOP REG UNIV (MISCELLANEOUS) ×2
KIT ENDO PROCEDURE OLY (KITS) ×2 IMPLANT
WATER STERILE IRR 250ML POUR (IV SOLUTION) ×2 IMPLANT

## 2017-06-30 NOTE — Op Note (Signed)
[facility] Gastroenterology Patient Name: [name] Procedure Date: 06/30/2017 9:41 AM MRN: [id number] Account #: [id number] Date of Birth: 1955/05/15 Admit Type: Outpatient Age: 62 Room: [facility] OR ROOM 01 Gender: Female Note Status: Finalized Procedure:            Colonoscopy Indications:          Screening for colorectal malignant neoplasm Providers:            Lucilla Lame MD, MD Referring MD:         Arnetha Courser (Referring MD) Medicines:            Propofol per Anesthesia Complications:        No immediate complications. Procedure:            Pre-Anesthesia Assessment:                       - Prior to the procedure, a History and Physical was                        performed, and patient medications and allergies were                        reviewed. The patient's tolerance of previous                        anesthesia was also reviewed. The risks and benefits of                        the procedure and the sedation options and risks were                        discussed with the patient. All questions were                        answered, and informed consent was obtained. Prior                        Anticoagulants: The patient has taken no previous                        anticoagulant or antiplatelet agents. ASA Grade                        Assessment: II - A patient with mild systemic disease.                        After reviewing the risks and benefits, the patient was                        deemed in satisfactory condition to undergo the                        procedure.                       After obtaining informed consent, the colonoscope was                        passed under direct vision. Throughout the procedure,  the patient's blood pressure, pulse, and oxygen                        saturations were monitored continuously. The [place] [phone]) was introduced through the                     anus and advanced to the the cecum, identified by                        appendiceal orifice and ileocecal valve. The                        colonoscopy was performed without difficulty. The                        patient tolerated the procedure well. The quality of                        the bowel preparation was excellent. Findings:      The perianal and digital rectal examinations were normal.      A few small-mouthed diverticula were found in the sigmoid colon.      Non-bleeding internal hemorrhoids were found during retroflexion. The       hemorrhoids were Grade I (internal hemorrhoids that do not prolapse). Impression:           - Diverticulosis in the sigmoid colon.                       - Non-bleeding internal hemorrhoids.                       - No specimens collected. Recommendation:       - Discharge patient to home.                       - Resume previous diet.                       - Continue present medications.                       - Repeat colonoscopy in 10 years for screening unless                        any change in family history or lower GI problems. Procedure Code(s):    --- Professional ---                       [phone], Colonoscopy, flexible; diagnostic, including                        collection of specimen(s) by brushing or washing, when                        performed (separate procedure) Diagnosis Code(s):    --- Professional ---                       Z12.11, Encounter for screening for malignant neoplasm  of colon CPT copyright 2016 [employer]. All rights reserved. The codes documented in this report are preliminary and upon coder review may  be revised to meet current compliance requirements. Lucilla Lame MD, MD 06/30/2017 10:01:53 AM This report has been signed electronically. Number of Addenda: 0 Note Initiated On: 06/30/2017 9:41 AM Scope Withdrawal Time: 0 hours 6 minutes 4 seconds  Total  Procedure Duration: 0 hours 8 minutes 11 seconds       [facility]

## 2017-06-30 NOTE — Anesthesia Preprocedure Evaluation (Signed)
Anesthesia Evaluation  Patient identified by MRN, date of birth, ID band Patient awake    Reviewed: Allergy & Precautions, NPO status , Patient's Chart, lab work & pertinent test results  History of Anesthesia Complications Negative for: history of anesthetic complications  Airway Mallampati: III  TM Distance: >3 FB Neck ROM: Full    Dental  (+) Partial Upper   Pulmonary sleep apnea ,    Pulmonary exam normal breath sounds clear to auscultation       Cardiovascular Exercise Tolerance: Good hypertension, Normal cardiovascular exam Rhythm:Regular Rate:Normal     Neuro/Psych PSYCHIATRIC DISORDERS Depression Pituitary adenoma s/p resection    GI/Hepatic GERD  ,  Endo/Other  diabetes, Type 2  Renal/GU      Musculoskeletal  (+) Arthritis , Osteoarthritis,    Abdominal   Peds  Hematology Hx thrombocytosis   Anesthesia Other Findings   Reproductive/Obstetrics                             Anesthesia Physical Anesthesia Plan  ASA: II  Anesthesia Plan: General   Post-op Pain Management:    Induction: Intravenous  PONV Risk Score and Plan: 2 and TIVA and Propofol infusion  Airway Management Planned: Natural Airway  Additional Equipment:   Intra-op Plan:   Post-operative Plan:   Informed Consent: I have reviewed the patients History and Physical, chart, labs and discussed the procedure including the risks, benefits and alternatives for the proposed anesthesia with the patient or authorized representative who has indicated his/her understanding and acceptance.     Plan Discussed with: CRNA  Anesthesia Plan Comments:         Anesthesia Quick Evaluation

## 2017-06-30 NOTE — H&P (Signed)
Jean Lame, MD [address] [place], [place] [address] Phone: [phone] Fax : [phone]  Primary Care Physician:  Jean Courser, MD Primary Gastroenterologist:  [name]  Pre-Procedure History & Physical: HPI:  [name] is a 62 y.o. female is here for a screening colonoscopy.   Past Medical History:  Diagnosis Date  . Anemia   . Benign hypertensive renal disease   . DDD (degenerative disc disease), lumbar   . Elevated alkaline phosphatase level   . Fibrocystic disease of both breasts   . GERD (gastroesophageal reflux disease)   . Hyperlipidemia   . Hypertension   . Impaired fasting glucose   . Insomnia   . Menopausal state   . Migraine   . Osteoarthritis   . 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 Perforation of nasal septum   . Pre-diabetes   . Recurrent major depression ([place])   . Sleep apnea    Does not use CPAP  . Spondylosis   . Thrombocytosis ([place]) 10/01/2015   mild  . Wears dentures    full upper    Past Surgical History:  Procedure Laterality Date  . ABDOMINAL HYSTERECTOMY     partial with bladder suspension, no cervix  . CHOLECYSTECTOMY  2003  . COLONOSCOPY  2008   1 polyp- return 10 years  . KNEE SURGERY Right 2014  . PITUITARY SURGERY     x2 30+ years ago  . transnasal approach pituitary adenoma resection  2015   clival/parasellar mass    Prior to Admission medications   Medication Sig Start Date End Date Taking? Authorizing Provider  atorvastatin (LIPITOR) 20 MG tablet Take 1 tablet (20 mg total) by mouth at bedtime. 06/09/17  Yes Lada, Satira Anis, MD  fluticasone (FLONASE) 50 MCG/ACT nasal spray Place 2 sprays into both nostrils daily. Patient taking differently: Place 2 sprays into both nostrils as needed.  02/19/16  Yes Lada, Satira Anis, MD  losartan (COZAAR) 50 MG tablet Take 1 tablet (50 mg total) by mouth daily. 06/13/17  Yes Lada, Satira Anis, MD  metFORMIN (GLUCOPHAGE) 500 MG tablet Take 500 mg by mouth every other day.   12/24/14  Yes [provider]  polyethylene glycol powder (GLYCOLAX/MIRALAX) powder TAKE 255 G BY MOUTH ONCE. 03/12/17  Yes [provider]  Vitamin D, Ergocalciferol, (DRISDOL) 50000 units CAPS capsule Take 1 capsule (50,000 Units total) by mouth every 7 (seven) days. Once complete, take over the counter Vitamin D supplement 1000 IU daily. Patient not taking: Reported on 06/24/2017 03/13/17   Hubbard Hartshorn, FNP    Allergies as of 05/26/2017 - Review Complete 04/30/2017  Allergen Reaction Noted  . Lisinopril Other (See Comments) 08/22/2014  . Sulfa antibiotics  08/22/2014    Family History  Problem Relation Age of Onset  . Diabetes Mother   . Hypertension Mother   . Cancer Mother        possible liver  . Heart disease Father   . Hypertension Father   . Heart attack Father   . Alzheimer's disease Father   . Diabetes Sister   . Hypertension Sister   . Cancer Sister        lung?  . Stroke Sister   . Atrial fibrillation Sister   . Breast cancer Maternal Grandmother     Social History   Socioeconomic History  . Marital status: Divorced    Spouse name: Not on file  . Number of children: Not on file  .  Years of education: Not on file  . Highest education level: Not on file  Social Needs  . Financial resource strain: Not on file  . Food insecurity - worry: Not on file  . Food insecurity - inability: Not on file  . Transportation needs - medical: Not on file  . Transportation needs - non-medical: Not on file  Occupational History  . Not on file  Tobacco Use  . Smoking status: Never Smoker  . Smokeless tobacco: Never Used  Substance and Sexual Activity  . Alcohol use: No    Comment: rare (1-2 drinks/yr)  . Drug use: No  . Sexual activity: Yes    Birth control/protection: Surgical  Other Topics Concern  . Not on file  Social History Narrative  . Not on file    Review of Systems: See HPI, otherwise negative ROS  Physical Exam: BP (!) 154/98    Pulse 65   Temp 97.9 F (36.6 C) (Temporal)   Resp 16   Ht 5\' 6"  (1.676 m)   Wt 203 lb (92.1 kg)   SpO2 100%   BMI 32.77 kg/m  General:   Alert,  pleasant and cooperative in NAD Head:  Normocephalic and atraumatic. Neck:  Supple; no masses or thyromegaly. Lungs:  Clear throughout to auscultation.    Heart:  Regular rate and rhythm. Abdomen:  Soft, nontender and nondistended. Normal bowel sounds, without guarding, and without rebound.   Neurologic:  Alert and  oriented x4;  grossly normal neurologically.  Impression/Plan: [name] is now here to undergo a screening colonoscopy.  Risks, benefits, and alternatives regarding colonoscopy have been reviewed with the patient.  Questions have been answered.  All parties agreeable.

## 2017-06-30 NOTE — Transfer of Care (Signed)
Immediate Anesthesia Transfer of Care Note  Patient: [name]  Procedure(s) Performed: COLONOSCOPY WITH PROPOFOL (N/A Rectum)  Patient Location: PACU  Anesthesia Type: General  Level of Consciousness: awake, alert  and patient cooperative  Airway and Oxygen Therapy: Patient Spontanous Breathing and Patient connected to supplemental oxygen  Post-op Assessment: Post-op Vital signs reviewed, Patient's Cardiovascular Status Stable, Respiratory Function Stable, Patent Airway and No signs of Nausea or vomiting  Post-op Vital Signs: Reviewed and stable  Complications: No apparent anesthesia complications

## 2017-06-30 NOTE — Anesthesia Procedure Notes (Signed)
Procedure Name: MAC Date/Time: 06/30/2017 9:45 AM Performed by: Janna Arch, CRNA Pre-anesthesia Checklist: Patient identified, Emergency Drugs available, Suction available and Patient being monitored Patient Re-evaluated:Patient Re-evaluated prior to induction Oxygen Delivery Method: Nasal cannula

## 2017-06-30 NOTE — Anesthesia Postprocedure Evaluation (Signed)
Anesthesia Post Note  Patient: [name]  Procedure(s) Performed: COLONOSCOPY WITH PROPOFOL (N/A Rectum)  Patient location during evaluation: PACU Anesthesia Type: General Level of consciousness: awake and alert, oriented and patient cooperative Pain management: pain level controlled Vital Signs Assessment: post-procedure vital signs reviewed and stable Respiratory status: spontaneous breathing, nonlabored ventilation and respiratory function stable Cardiovascular status: blood pressure returned to baseline and stable Postop Assessment: adequate PO intake Anesthetic complications: no    Darrin Nipper

## 2017-07-23 ENCOUNTER — Ambulatory Visit: Payer: Self-pay | Admitting: *Deleted

## 2017-07-23 NOTE — Telephone Encounter (Signed)
Please see triage note below, for informational purposes only.

## 2017-07-23 NOTE — Telephone Encounter (Signed)
Pt called with an elevated b/p. She states her b/p has been as high as 225/121 and this morning it is 158/105. She also states she is having some chest tightness, headache, weakness and a little short of breath. She said she has blurred vision but that is all the time.  She went to the track this morning to walk and said that she started experiencing chest tightness and then decided to call to see if someone at the office could check her b/p.  She stated that she had missed several doses of her b/p meds, usually a couple times a week up until last Thursday when she checked her b/p and it was 225/121. Advised patient to go to the emergency department [facility]). She states she is going home to check her b/p and see what it is. Flow at [facility] was notified of pt's decision to go home.  Reason for Disposition . [7] Systolic BP  >= 035 OR Diastolic >= 009 AND [3] cardiac or neurologic symptoms (e.g., chest pain, difficulty breathing, unsteady gait, blurred vision)  Answer Assessment - Initial Assessment Questions 1. BLOOD PRESSURE: "What is the blood pressure?" "Did you take at least two measurements 5 minutes apart?"     158/105 2. ONSET: "When did you take your blood pressure?"     This morning 3. HOW: "How did you obtain the blood pressure?" (e.g., visiting nurse, automatic home BP monitor)     Automatic home BP monitor 4. HISTORY: "Do you have a history of high blood pressure?"     yes 5. MEDICATIONS: "Are you taking any medications for blood pressure?" "Have you missed any doses recently?"     Yes and have missed taking a couple times a week up until Thursday 6. OTHER SYMPTOMS: "Do you have any symptoms?" (e.g., headache, chest pain, blurred vision, difficulty breathing, weakness)     Headache, chest tightness, weakness 7. PREGNANCY: "Is there any chance you are pregnant?" "When was your last menstrual period?"     no  Protocols used: HIGH BLOOD PRESSURE-A-AH

## 2017-07-25 ENCOUNTER — Encounter: Payer: Self-pay | Admitting: Family Medicine

## 2017-07-25 ENCOUNTER — Ambulatory Visit (INDEPENDENT_AMBULATORY_CARE_PROVIDER_SITE_OTHER): Payer: 59 | Admitting: Family Medicine

## 2017-07-25 VITALS — BP 162/104 | HR 86 | Temp 98.4°F | Resp 14 | Wt 208.1 lb

## 2017-07-25 DIAGNOSIS — R400 Somnolence: Secondary | ICD-10-CM | POA: Diagnosis not present

## 2017-07-25 DIAGNOSIS — N3001 Acute cystitis with hematuria: Secondary | ICD-10-CM | POA: Diagnosis not present

## 2017-07-25 DIAGNOSIS — I10 Essential (primary) hypertension: Secondary | ICD-10-CM

## 2017-07-25 DIAGNOSIS — R0683 Snoring: Secondary | ICD-10-CM

## 2017-07-25 DIAGNOSIS — G4730 Sleep apnea, unspecified: Secondary | ICD-10-CM

## 2017-07-25 MED ORDER — CEFPODOXIME PROXETIL 200 MG PO TABS: 200.0000 mg | ORAL_TABLET | Freq: Two times a day (BID) | ORAL | 0 refills | Status: DC

## 2017-07-25 MED ORDER — TRAZODONE HCL 50 MG PO TABS: 25.0000 mg | ORAL_TABLET | Freq: Every evening | ORAL | 3 refills | Status: DC | PRN

## 2017-07-25 MED ORDER — AMLODIPINE BESYLATE 10 MG PO TABS
10.0000 mg | ORAL_TABLET | Freq: Every day | ORAL | 0 refills | Status: DC
Start: 2017-07-25 — End: 2017-08-16

## 2017-07-25 NOTE — Assessment & Plan Note (Signed)
Refer for evaluation, possible sleep study

## 2017-07-25 NOTE — Patient Instructions (Addendum)
Please do contact your neurosurgeon about whether or not you should continue to use products up in the nose Try to use PLAIN allergy medicine without the decongestant Avoid: phenylephrine, phenylpropanolamine, and pseudoephredine Try to follow the DASH guidelines (DASH stands for Dietary Approaches to Stop Hypertension). Try to limit the sodium in your diet to no more than 1,500mg  of sodium per day. Certainly try to not exceed 2,000 mg per day at the very most. Do not add salt when cooking or at the table.  Check the sodium amount on labels when shopping, and choose items lower in sodium when given a choice. Avoid or limit foods that already contain a lot of sodium. Eat a diet rich in fruits and vegetables and whole grains, and try to lose weight if overweight or obese  DASH Eating Plan DASH stands for "Dietary Approaches to Stop Hypertension." The DASH eating plan is a healthy eating plan that has been shown to reduce high blood pressure (hypertension). It may also reduce your risk for type 2 diabetes, heart disease, and stroke. The DASH eating plan may also help with weight loss. What are tips for following this plan? General guidelines  Avoid eating more than 2,300 mg (milligrams) of salt (sodium) a day. If you have hypertension, you may need to reduce your sodium intake to 1,500 mg a day.  Limit alcohol intake to no more than 1 drink a day for nonpregnant women and 2 drinks a day for men. One drink equals 12 oz of beer, 5 oz of wine, or 1 oz of hard liquor.  Work with your health care provider to maintain a healthy body weight or to lose weight. Ask what an ideal weight is for you.  Get at least 30 minutes of exercise that causes your heart to beat faster (aerobic exercise) most days of the week. Activities may include walking, swimming, or biking.  Work with your health care provider or diet and nutrition specialist (dietitian) to adjust your eating plan to your individual calorie  needs. Reading food labels  Check food labels for the amount of sodium per serving. Choose foods with less than 5 percent of the Daily Value of sodium. Generally, foods with less than 300 mg of sodium per serving fit into this eating plan.  To find whole grains, look for the word "whole" as the first word in the ingredient list. Shopping  Buy products labeled as "low-sodium" or "no salt added."  Buy fresh foods. Avoid canned foods and premade or frozen meals. Cooking  Avoid adding salt when cooking. Use salt-free seasonings or herbs instead of table salt or sea salt. Check with your health care provider or pharmacist before using salt substitutes.  Do not fry foods. Cook foods using healthy methods such as baking, boiling, grilling, and broiling instead.  Cook with heart-healthy oils, such as olive, canola, soybean, or sunflower oil. Meal planning   Eat a balanced diet that includes: ? 5 or more servings of fruits and vegetables each day. At each meal, try to fill half of your plate with fruits and vegetables. ? Up to 6-8 servings of whole grains each day. ? Less than 6 oz of lean meat, poultry, or fish each day. A 3-oz serving of meat is about the same size as a deck of cards. One egg equals 1 oz. ? 2 servings of low-fat dairy each day. ? A serving of nuts, seeds, or beans 5 times each week. ? Heart-healthy fats. Healthy fats called Omega-3 fatty  acids are found in foods such as flaxseeds and coldwater fish, like sardines, salmon, and mackerel.  Limit how much you eat of the following: ? Canned or prepackaged foods. ? Food that is high in trans fat, such as fried foods. ? Food that is high in saturated fat, such as fatty meat. ? Sweets, desserts, sugary drinks, and other foods with added sugar. ? Full-fat dairy products.  Do not salt foods before eating.  Try to eat at least 2 vegetarian meals each week.  Eat more home-cooked food and less restaurant, buffet, and fast  food.  When eating at a restaurant, ask that your food be prepared with less salt or no salt, if possible. What foods are recommended? The items listed may not be a complete list. Talk with your dietitian about what dietary choices are best for you. Grains Whole-grain or whole-wheat bread. Whole-grain or whole-wheat pasta. Brown rice. [place]. Bulgur. Whole-grain and low-sodium cereals. Pita bread. Low-fat, low-sodium crackers. Whole-wheat flour tortillas. Vegetables Fresh or frozen vegetables (raw, steamed, roasted, or grilled). Low-sodium or reduced-sodium tomato and vegetable juice. Low-sodium or reduced-sodium tomato sauce and tomato paste. Low-sodium or reduced-sodium canned vegetables. Fruits All fresh, dried, or frozen fruit. Canned fruit in natural juice (without added sugar). Meat and other protein foods Skinless chicken or [place]. Ground chicken or [place]. Pork with fat trimmed off. Fish and seafood. Egg whites. Dried beans, peas, or lentils. Unsalted nuts, nut butters, and seeds. Unsalted canned beans. Lean cuts of beef with fat trimmed off. Low-sodium, lean deli meat. Dairy Low-fat (1%) or fat-free (skim) milk. Fat-free, low-fat, or reduced-fat cheeses. Nonfat, low-sodium ricotta or cottage cheese. Low-fat or nonfat yogurt. Low-fat, low-sodium cheese. Fats and oils Soft margarine without trans fats. Vegetable oil. Low-fat, reduced-fat, or light mayonnaise and salad dressings (reduced-sodium). Canola, safflower, olive, soybean, and sunflower oils. Avocado. Seasoning and other foods Herbs. Spices. Seasoning mixes without salt. Unsalted popcorn and pretzels. Fat-free sweets. What foods are not recommended? The items listed may not be a complete list. Talk with your dietitian about what dietary choices are best for you. Grains Baked goods made with fat, such as croissants, muffins, or some breads. Dry pasta or rice meal packs. Vegetables Creamed or fried vegetables. Vegetables  in a cheese sauce. Regular canned vegetables (not low-sodium or reduced-sodium). Regular canned tomato sauce and paste (not low-sodium or reduced-sodium). Regular tomato and vegetable juice (not low-sodium or reduced-sodium). Angie Fava. Olives. Fruits Canned fruit in a light or heavy syrup. Fried fruit. Fruit in cream or butter sauce. Meat and other protein foods Fatty cuts of meat. Ribs. Fried meat. Berniece Salines. Sausage. Bologna and other processed lunch meats. Salami. Fatback. Hotdogs. Bratwurst. Salted nuts and seeds. Canned beans with added salt. Canned or smoked fish. Whole eggs or egg yolks. Chicken or [place] with skin. Dairy Whole or 2% milk, cream, and half-and-half. Whole or full-fat cream cheese. Whole-fat or sweetened yogurt. Full-fat cheese. Nondairy creamers. Whipped toppings. Processed cheese and cheese spreads. Fats and oils Butter. Stick margarine. Lard. Shortening. Ghee. Bacon fat. Tropical oils, such as coconut, palm kernel, or palm oil. Seasoning and other foods Salted popcorn and pretzels. Onion salt, garlic salt, seasoned salt, table salt, and sea salt. Worcestershire sauce. Tartar sauce. Barbecue sauce. Teriyaki sauce. Soy sauce, including reduced-sodium. Steak sauce. Canned and packaged gravies. Fish sauce. Oyster sauce. Cocktail sauce. Horseradish that you find on the shelf. Ketchup. Mustard. Meat flavorings and tenderizers. Bouillon cubes. Hot sauce and Tabasco sauce. Premade or packaged marinades. Premade or packaged  taco seasonings. Relishes. Regular salad dressings. Where to find more information:  National Heart, Lung, and [place]: https://wilson-eaton.com/  American Heart Association: www.heart.org Summary  The DASH eating plan is a healthy eating plan that has been shown to reduce high blood pressure (hypertension). It may also reduce your risk for type 2 diabetes, heart disease, and stroke.  With the DASH eating plan, you should limit salt (sodium) intake to 2,300 mg a  day. If you have hypertension, you may need to reduce your sodium intake to 1,500 mg a day.  When on the DASH eating plan, aim to eat more fresh fruits and vegetables, whole grains, lean proteins, low-fat dairy, and heart-healthy fats.  Work with your health care provider or diet and nutrition specialist (dietitian) to adjust your eating plan to your individual calorie needs. This information is not intended to replace advice given to you by your health care provider. Make sure you discuss any questions you have with your health care provider. Document Released: 04/18/2011 Document Revised: 04/22/2016 Document Reviewed: 04/22/2016 Elsevier Interactive Patient Education  [employer].

## 2017-07-25 NOTE — Progress Notes (Signed)
BP (!) 162/104   Pulse 86   Temp 98.4 F (36.9 C) (Oral)   Resp 14   Wt 208 lb 1.6 oz (94.4 kg)   SpO2 96%   BMI 33.59 kg/m    Subjective:    Patient ID: [name], female    DOB: 1955-12-27, 62 y.o.   MRN: [id number]  HPI: [name] is a 62 y.o. female  Chief Complaint  Patient presents with  . Follow-up    ER for bp and UTI with dizziness    HPI Just started to have BP spikes as of Thursday a week ago 225/121, accompanied by chest tightness, headache, weakness, and SHOB, and she called triage and they told her to go to the ER (she did not) Big change in stress level and cannot sleep She thought it was all stress and no sleep, but can't figure out why the big spikes are coming and going She said it spiked up in the ER while she was there; 187/111 when she left No low blood pressures; taking 50 mg of losartan regularly No swelling in the legs; cannot lay flat on her back, feels like she is smothering; has to lay on her side Snores; not sure if she stops breathing in her sleep No change in diet at this time Used decongestant yesterday; BC sinus  No chest pain right now; no shortness of breath, no headache right now, just woozy; it feels up in her behind the eyes She has been using nasal spray, just when congested; she does not know if she has an opening after her brain surgery; when he sinuses act up, ears feels swollen Taking metformin every other day, prescribed by endocrinologist  Trop and TSH normal; normal T4; no protein in the urine; abnormal urine did show LE, bacteria, small blood, (+) nitrites No dysuria; no abdominal pain Mold in the house; wonders if that is an issue with things, how she feels Stress; does not want to see a counselor; has tried that before Not sleeping well; tried melatonin  Depression screen [facility] 2/9 07/25/2017 04/30/2017 02/19/2016 08/25/2015 02/16/2015  Decreased Interest 0 0 0 0 0  Down, Depressed, Hopeless 0 1 0 1 0  PHQ - 2  Score 0 1 0 1 0    Relevant past medical, surgical, family and social history reviewed Past Medical History:  Diagnosis Date  . Anemia   . Benign hypertensive renal disease   . DDD (degenerative disc disease), lumbar   . Elevated alkaline phosphatase level   . Fibrocystic disease of both breasts   . GERD (gastroesophageal reflux disease)   . Hyperlipidemia   . Hypertension   . Impaired fasting glucose   . Insomnia   . Menopausal state   . Migraine   . Osteoarthritis   . 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 Perforation of nasal septum   . Pre-diabetes   . Recurrent major depression ([place])   . Sleep apnea    Does not use CPAP  . Spondylosis   . Thrombocytosis ([place]) 10/01/2015   mild  . Wears dentures    full upper   Past Surgical History:  Procedure Laterality Date  . ABDOMINAL HYSTERECTOMY     partial with bladder suspension, no cervix  . CHOLECYSTECTOMY  2003  . COLONOSCOPY  2008   1 polyp- return 10 years  . COLONOSCOPY WITH PROPOFOL N/A 06/30/2017   Procedure: COLONOSCOPY WITH PROPOFOL;  Surgeon: Lucilla Lame, MD;  Location: [facility];  Service: Endoscopy;  Laterality: N/A;  slep apnea  . KNEE SURGERY Right 2014  . PITUITARY SURGERY     x2 30+ years ago  . 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 Diabetes Mother   . Hypertension Mother   . Cancer Mother        possible liver  . Heart disease Father   . Hypertension Father   . Heart attack Father   . Alzheimer's disease Father   . Diabetes Sister   . Hypertension Sister   . Cancer Sister        lung?  . Stroke Sister   . Atrial fibrillation Sister   . Breast cancer Maternal Grandmother    Social History   Tobacco Use  . Smoking status: Never Smoker  . Smokeless tobacco: Never Used  Substance Use Topics  . Alcohol use: No    Comment: rare (1-2 drinks/yr)  . Drug use: No    Interim medical history  since last visit reviewed. Allergies and medications reviewed  Review of Systems Per HPI unless specifically indicated above     Objective:    BP (!) 162/104   Pulse 86   Temp 98.4 F (36.9 C) (Oral)   Resp 14   Wt 208 lb 1.6 oz (94.4 kg)   SpO2 96%   BMI 33.59 kg/m   Wt Readings from Last 3 Encounters:  07/25/17 208 lb 1.6 oz (94.4 kg)  06/30/17 203 lb (92.1 kg)  04/30/17 206 lb 12.8 oz (93.8 kg)    Physical Exam  Constitutional: She appears well-developed and well-nourished. No distress.  HENT:  Head: Normocephalic and atraumatic.  Eyes: EOM are normal. No scleral icterus.  Neck: No thyromegaly present.  Cardiovascular: Normal rate, regular rhythm and normal heart sounds.  No murmur heard. Pulmonary/Chest: Effort normal and breath sounds normal. No respiratory distress. She has no wheezes.  Abdominal: Soft. Normal appearance and bowel sounds are normal. She exhibits no distension, no abdominal bruit and no mass.  Musculoskeletal: Normal range of motion. She exhibits no edema.  Neurological: She is alert. She exhibits normal muscle tone.  Skin: Skin is warm and dry. She is not diaphoretic. No pallor.  Psychiatric: She has a normal mood and affect.    Results for orders placed or performed in visit on 04/30/17  Hemoglobin A1c  Result Value Ref Range   Hgb A1c MFr Bld 6.1 (H) <5.7 % of total Hgb   Mean Plasma Glucose 128 (calc)   eAG (mmol/L) 7.1 (calc)  COMPLETE METABOLIC PANEL WITH GFR  Result Value Ref Range   Glucose, Bld 99 65 - 99 mg/dL   BUN 8 7 - 25 mg/dL   Creat 0.93 0.50 - 0.99 mg/dL   GFR, Est Non African American 66 > OR = 60 mL/min/1.70m2   GFR, Est African American 76 > OR = 60 mL/min/1.32m2   BUN/Creatinine Ratio NOT APPLICABLE 6 - 22 (calc)   Sodium 141 135 - 146 mmol/L   Potassium 3.8 3.5 - 5.3 mmol/L   Chloride 106 98 - 110 mmol/L   CO2 27 20 - 32 mmol/L   Calcium 9.6 8.6 - 10.4 mg/dL   Total Protein 7.0 6.1 - 8.1 g/dL   Albumin 4.0 3.6 -  5.1 g/dL   Globulin 3.0 1.9 - 3.7 g/dL (calc)   AG Ratio 1.3 1.0 - 2.5 (calc)   Total  Bilirubin 0.3 0.2 - 1.2 mg/dL   Alkaline phosphatase (APISO) 132 (H) 33 - 130 U/L   AST 14 10 - 35 U/L   ALT 14 6 - 29 U/L  Lipid panel  Result Value Ref Range   Cholesterol 171 <200 mg/dL   HDL 54 >50 mg/dL   Triglycerides 95 <150 mg/dL   LDL Cholesterol (Calc) 98 mg/dL (calc)   Total CHOL/HDL Ratio 3.2 <5.0 (calc)   Non-HDL Cholesterol (Calc) 117 <130 mg/dL (calc)  CBC with Differential/Platelet  Result Value Ref Range   WBC 4.9 3.8 - 10.8 Thousand/uL   RBC 4.64 3.80 - 5.10 Million/uL   Hemoglobin 11.7 11.7 - 15.5 g/dL   HCT 36.7 35.0 - 45.0 %   MCV 79.1 (L) 80.0 - 100.0 fL   MCH 25.2 (L) 27.0 - 33.0 pg   MCHC 31.9 (L) 32.0 - 36.0 g/dL   RDW 14.4 11.0 - 15.0 %   Platelets 362 140 - 400 Thousand/uL   MPV 10.3 7.5 - 12.5 fL   Neutro Abs 2,563 1,500 - 7,800 cells/uL   Lymphs Abs 1,632 850 - 3,900 cells/uL   WBC mixed population 627 200 - 950 cells/uL   Eosinophils Absolute 59 15 - 500 cells/uL   Basophils Absolute 20 0 - 200 cells/uL   Neutrophils Relative % 52.3 %   Total Lymphocyte 33.3 %   Monocytes Relative 12.8 %   Eosinophils Relative 1.2 %   Basophils Relative 0.4 %      Assessment & Plan:   Problem List Items Addressed This Visit      Cardiovascular and Mediastinum   Hypertension - Primary    Continue the ARB, add CCB; cautioned about possible constipation; check BP at home and call if not coming down; DASH guidelines; close f/u      Relevant Medications   amLODipine (NORVASC) 10 MG tablet   Other Relevant Orders   Ambulatory referral to Pulmonology     Respiratory   Sleep apnea    Had machine, did not work; will refer to pulm      Relevant Orders   Ambulatory referral to Pulmonology     Other   Somnolence, daytime    Refer to pulm      Relevant Orders   Ambulatory referral to Pulmonology   Snores    Refer for evaluation, possible sleep study       Relevant Orders   Ambulatory referral to Pulmonology    Other Visit Diagnoses    Acute cystitis with hematuria       treat with antibiotics; C diff precautions, yogurt or probiotics      Discussed s/s of heart attack, reasons to seek immediate medical care  Follow up plan: Return in about 1 week (around 08/01/2017) for follow-up visit with Dr. Sanda Klein.  An after-visit summary was printed and given to the patient at [place].  Please see the patient instructions which may contain other information and recommendations beyond what is mentioned above in the assessment and plan.  Meds ordered this encounter  Medications  . amLODipine (NORVASC) 10 MG tablet    Sig: Take 1 tablet (10 mg total) by mouth daily. For blood pressure    Dispense:  30 tablet    Refill:  0  . cefpodoxime (VANTIN) 200 MG tablet    Sig: Take 1 tablet (200 mg total) by mouth 2 (two) times daily. Eat yogurt or take probiotics for one month    Dispense:  14 tablet  Refill:  0  . traZODone (DESYREL) 50 MG tablet    Sig: Take 0.5-1 tablets (25-50 mg total) by mouth at bedtime as needed for sleep.    Dispense:  30 tablet    Refill:  3    Orders Placed This Encounter  Procedures  . Ambulatory referral to Pulmonology

## 2017-07-25 NOTE — Assessment & Plan Note (Signed)
Continue the ARB, add CCB; cautioned about possible constipation; check BP at home and call if not coming down; DASH guidelines; close f/u

## 2017-07-25 NOTE — Assessment & Plan Note (Signed)
Refer to pulm

## 2017-07-25 NOTE — Assessment & Plan Note (Signed)
Had machine, did not work; will refer to pulm

## 2017-08-01 ENCOUNTER — Encounter: Payer: Self-pay | Admitting: Family Medicine

## 2017-08-01 ENCOUNTER — Ambulatory Visit (INDEPENDENT_AMBULATORY_CARE_PROVIDER_SITE_OTHER): Payer: 59 | Admitting: Family Medicine

## 2017-08-01 DIAGNOSIS — Z5181 Encounter for therapeutic drug level monitoring: Secondary | ICD-10-CM | POA: Diagnosis not present

## 2017-08-01 DIAGNOSIS — I10 Essential (primary) hypertension: Secondary | ICD-10-CM

## 2017-08-01 MED ORDER — LOSARTAN POTASSIUM 100 MG PO TABS: 100.0000 mg | ORAL_TABLET | Freq: Every day | ORAL | 3 refills | Status: DC

## 2017-08-01 NOTE — Assessment & Plan Note (Signed)
Check BMP in two weeks with increase of ARB

## 2017-08-01 NOTE — Patient Instructions (Addendum)
Let's have you increase the losartan to 100 mg daily (two 50 mg pills = 100 mg until you use them up) Continue the amlopidine  Caution: prolonged use of proton pump inhibitors like omeprazole (Prilosec), pantoprazole (Protonix), esomeprazole (Nexium), and others like Dexilant and Aciphex may increase your risk of pneumonia, Clostridium difficile colitis, osteoporosis, anemia and other health complications Try to limit or avoid triggers like coffee, caffeinated beverages, onions, chocolate, spicy foods, peppermint, acidic foods like pizza, spaghetti sauce, and orange juice Lose weight if you are overweight or obese Try elevating the head of your bed by placing a small wedge between your mattress and box springs to keep acid in the stomach at night instead of coming up into your esophagus  Return in 2 weeks to see Roselyn Reef or [place], for a blood pressure check and BMP (non-fasting) DASH Eating Plan DASH stands for "Dietary Approaches to Stop Hypertension." The DASH eating plan is a healthy eating plan that has been shown to reduce high blood pressure (hypertension). It may also reduce your risk for type 2 diabetes, heart disease, and stroke. The DASH eating plan may also help with weight loss. What are tips for following this plan? General guidelines  Avoid eating more than 2,300 mg (milligrams) of salt (sodium) a day. If you have hypertension, you may need to reduce your sodium intake to 1,500 mg a day.  Limit alcohol intake to no more than 1 drink a day for nonpregnant women and 2 drinks a day for men. One drink equals 12 oz of beer, 5 oz of wine, or 1 oz of hard liquor.  Work with your health care provider to maintain a healthy body weight or to lose weight. Ask what an ideal weight is for you.  Get at least 30 minutes of exercise that causes your heart to beat faster (aerobic exercise) most days of the week. Activities may include walking, swimming, or biking.  Work with your health care provider  or diet and nutrition specialist (dietitian) to adjust your eating plan to your individual calorie needs. Reading food labels  Check food labels for the amount of sodium per serving. Choose foods with less than 5 percent of the Daily Value of sodium. Generally, foods with less than 300 mg of sodium per serving fit into this eating plan.  To find whole grains, look for the word "whole" as the first word in the ingredient list. Shopping  Buy products labeled as "low-sodium" or "no salt added."  Buy fresh foods. Avoid canned foods and premade or frozen meals. Cooking  Avoid adding salt when cooking. Use salt-free seasonings or herbs instead of table salt or sea salt. Check with your health care provider or pharmacist before using salt substitutes.  Do not fry foods. Cook foods using healthy methods such as baking, boiling, grilling, and broiling instead.  Cook with heart-healthy oils, such as olive, canola, soybean, or sunflower oil. Meal planning   Eat a balanced diet that includes: ? 5 or more servings of fruits and vegetables each day. At each meal, try to fill half of your plate with fruits and vegetables. ? Up to 6-8 servings of whole grains each day. ? Less than 6 oz of lean meat, poultry, or fish each day. A 3-oz serving of meat is about the same size as a deck of cards. One egg equals 1 oz. ? 2 servings of low-fat dairy each day. ? A serving of nuts, seeds, or beans 5 times each week. ? Heart-healthy  fats. Healthy fats called Omega-3 fatty acids are found in foods such as flaxseeds and coldwater fish, like sardines, salmon, and mackerel.  Limit how much you eat of the following: ? Canned or prepackaged foods. ? Food that is high in trans fat, such as fried foods. ? Food that is high in saturated fat, such as fatty meat. ? Sweets, desserts, sugary drinks, and other foods with added sugar. ? Full-fat dairy products.  Do not salt foods before eating.  Try to eat at least 2  vegetarian meals each week.  Eat more home-cooked food and less restaurant, buffet, and fast food.  When eating at a restaurant, ask that your food be prepared with less salt or no salt, if possible. What foods are recommended? The items listed may not be a complete list. Talk with your dietitian about what dietary choices are best for you. Grains Whole-grain or whole-wheat bread. Whole-grain or whole-wheat pasta. Brown rice. [place]. Bulgur. Whole-grain and low-sodium cereals. Pita bread. Low-fat, low-sodium crackers. Whole-wheat flour tortillas. Vegetables Fresh or frozen vegetables (raw, steamed, roasted, or grilled). Low-sodium or reduced-sodium tomato and vegetable juice. Low-sodium or reduced-sodium tomato sauce and tomato paste. Low-sodium or reduced-sodium canned vegetables. Fruits All fresh, dried, or frozen fruit. Canned fruit in natural juice (without added sugar). Meat and other protein foods Skinless chicken or [place]. Ground chicken or [place]. Pork with fat trimmed off. Fish and seafood. Egg whites. Dried beans, peas, or lentils. Unsalted nuts, nut butters, and seeds. Unsalted canned beans. Lean cuts of beef with fat trimmed off. Low-sodium, lean deli meat. Dairy Low-fat (1%) or fat-free (skim) milk. Fat-free, low-fat, or reduced-fat cheeses. Nonfat, low-sodium ricotta or cottage cheese. Low-fat or nonfat yogurt. Low-fat, low-sodium cheese. Fats and oils Soft margarine without trans fats. Vegetable oil. Low-fat, reduced-fat, or light mayonnaise and salad dressings (reduced-sodium). Canola, safflower, olive, soybean, and sunflower oils. Avocado. Seasoning and other foods Herbs. Spices. Seasoning mixes without salt. Unsalted popcorn and pretzels. Fat-free sweets. What foods are not recommended? The items listed may not be a complete list. Talk with your dietitian about what dietary choices are best for you. Grains Baked goods made with fat, such as croissants, muffins, or  some breads. Dry pasta or rice meal packs. Vegetables Creamed or fried vegetables. Vegetables in a cheese sauce. Regular canned vegetables (not low-sodium or reduced-sodium). Regular canned tomato sauce and paste (not low-sodium or reduced-sodium). Regular tomato and vegetable juice (not low-sodium or reduced-sodium). Angie Fava. Olives. Fruits Canned fruit in a light or heavy syrup. Fried fruit. Fruit in cream or butter sauce. Meat and other protein foods Fatty cuts of meat. Ribs. Fried meat. Berniece Salines. Sausage. Bologna and other processed lunch meats. Salami. Fatback. Hotdogs. Bratwurst. Salted nuts and seeds. Canned beans with added salt. Canned or smoked fish. Whole eggs or egg yolks. Chicken or [place] with skin. Dairy Whole or 2% milk, cream, and half-and-half. Whole or full-fat cream cheese. Whole-fat or sweetened yogurt. Full-fat cheese. Nondairy creamers. Whipped toppings. Processed cheese and cheese spreads. Fats and oils Butter. Stick margarine. Lard. Shortening. Ghee. Bacon fat. Tropical oils, such as coconut, palm kernel, or palm oil. Seasoning and other foods Salted popcorn and pretzels. Onion salt, garlic salt, seasoned salt, table salt, and sea salt. Worcestershire sauce. Tartar sauce. Barbecue sauce. Teriyaki sauce. Soy sauce, including reduced-sodium. Steak sauce. Canned and packaged gravies. Fish sauce. Oyster sauce. Cocktail sauce. Horseradish that you find on the shelf. Ketchup. Mustard. Meat flavorings and tenderizers. Bouillon cubes. Hot sauce and Tabasco sauce. Premade  or packaged marinades. Premade or packaged taco seasonings. Relishes. Regular salad dressings. Where to find more information:  National Heart, Lung, and [place]: https://wilson-eaton.com/  American Heart Association: www.heart.org Summary  The DASH eating plan is a healthy eating plan that has been shown to reduce high blood pressure (hypertension). It may also reduce your risk for type 2 diabetes, heart disease,  and stroke.  With the DASH eating plan, you should limit salt (sodium) intake to 2,300 mg a day. If you have hypertension, you may need to reduce your sodium intake to 1,500 mg a day.  When on the DASH eating plan, aim to eat more fresh fruits and vegetables, whole grains, lean proteins, low-fat dairy, and heart-healthy fats.  Work with your health care provider or diet and nutrition specialist (dietitian) to adjust your eating plan to your individual calorie needs. This information is not intended to replace advice given to you by your health care provider. Make sure you discuss any questions you have with your health care provider. Document Released: 04/18/2011 Document Revised: 04/22/2016 Document Reviewed: 04/22/2016 Elsevier Interactive Patient Education  [employer].

## 2017-08-01 NOTE — Assessment & Plan Note (Signed)
Increase the ARB from 50 mg daily to 100 mg daily; limit salt; return in two weeks for recheck BP with CMA and BMP

## 2017-08-01 NOTE — Progress Notes (Signed)
BP 140/86   Pulse 84   Temp 98.6 F (37 C) (Oral)   Resp 14   Ht 5\' 6"  (1.676 m)   Wt 208 lb 1.6 oz (94.4 kg)   SpO2 95%   BMI 33.59 kg/m    Subjective:    Patient ID: [name], female    DOB: 07/21/1955, 62 y.o.   MRN: [id number]  HPI: [name] is a 62 y.o. female  Chief Complaint  Patient presents with  . Follow-up    1 week for sickness and bp     HPI Patient is here for f/u Went to the ER on 07/24/17, diagnosed with UTI, but never gave a urine; 187/111 there when she left; it was actually higher and she could feel it; 226/123 there She returned here on 07/25/17 and we used a different and symptoms in sinuses cleared up; feels a whole lot better; does not feel the pressure any more; patient is absolutely sure that no urine was collected from her; there are urine results in the chart No diarrhea on the antibiotics Her BP is still up; uses a machine at home, Omron, bicep cuff; checking every day recently; did not check yesterday Most top numbers 140-150 range, coming down Bottom numbers are 80s, but they were in the 100s to start with She does not cook; does not add salt to the food at the table Cut back on chips No decongestants Did start back to walking; walks on dirt track outside now that the weather is better   Depression screen [facility] 2/9 07/25/2017 04/30/2017 02/19/2016 08/25/2015 02/16/2015  Decreased Interest 0 0 0 0 0  Down, Depressed, Hopeless 0 1 0 1 0  PHQ - 2 Score 0 1 0 1 0    Relevant past medical, surgical, family and social history reviewed Past Medical History:  Diagnosis Date  . Anemia   . Benign hypertensive renal disease   . DDD (degenerative disc disease), lumbar   . Elevated alkaline phosphatase level   . Fibrocystic disease of both breasts   . GERD (gastroesophageal reflux disease)   . Hyperlipidemia   . Hypertension   . Impaired fasting glucose   . Insomnia   . Menopausal state   . Migraine   . Osteoarthritis   . Other  birth injuries to skull   . Perforation of nasal septum   . Pre-diabetes   . Recurrent major depression ([place])   . Sleep apnea    Does not use CPAP  . Spondylosis   . Thrombocytosis ([place]) 10/01/2015   mild  . Wears dentures    full upper   Past Surgical History:  Procedure Laterality Date  . ABDOMINAL HYSTERECTOMY     partial with bladder suspension, no cervix  . CHOLECYSTECTOMY  2003  . COLONOSCOPY  2008   1 polyp- return 10 years  . COLONOSCOPY WITH PROPOFOL N/A 06/30/2017   Procedure: COLONOSCOPY WITH PROPOFOL;  Surgeon: Lucilla Lame, MD;  Location: [place];  Service: Endoscopy;  Laterality: N/A;  slep apnea  . KNEE SURGERY Right 2014  . PITUITARY SURGERY     x2 30+ years ago  . 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 Diabetes Mother   . Hypertension Mother   . Cancer Mother        possible liver  . Heart disease Father   . Hypertension Father   .  Heart attack Father   . Alzheimer's disease Father   . Diabetes Sister   . Hypertension Sister   . Cancer Sister        lung?  . Stroke Sister   . Atrial fibrillation Sister   . Breast cancer Maternal Grandmother    Social History   Tobacco Use  . Smoking status: Never Smoker  . Smokeless tobacco: Never Used  Substance Use Topics  . Alcohol use: No    Comment: rare (1-2 drinks/yr)  . Drug use: No    Interim medical history since last visit reviewed. Allergies and medications reviewed  Review of Systems  Constitutional: Negative for unexpected weight change.  Cardiovascular: Negative for chest pain and leg swelling.   Per HPI unless specifically indicated above     Objective:    BP 140/86   Pulse 84   Temp 98.6 F (37 C) (Oral)   Resp 14   Ht 5\' 6"  (1.676 m)   Wt 208 lb 1.6 oz (94.4 kg)   SpO2 95%   BMI 33.59 kg/m   Wt Readings from Last 3 Encounters:  08/01/17 208 lb 1.6 oz (94.4 kg)  07/25/17  208 lb 1.6 oz (94.4 kg)  06/30/17 203 lb (92.1 kg)    Physical Exam  Constitutional: She appears well-developed and well-nourished.  HENT:  Mouth/Throat: Mucous membranes are normal.  Eyes: EOM are normal. No scleral icterus.  Cardiovascular: Normal rate and regular rhythm.  Pulmonary/Chest: Effort normal and breath sounds normal.  Musculoskeletal: She exhibits no edema.  Psychiatric: She has a normal mood and affect. Her behavior is normal.    Results for orders placed or performed in visit on 04/30/17  Hemoglobin A1c  Result Value Ref Range   Hgb A1c MFr Bld 6.1 (H) <5.7 % of total Hgb   Mean Plasma Glucose 128 (calc)   eAG (mmol/L) 7.1 (calc)  COMPLETE METABOLIC PANEL WITH GFR  Result Value Ref Range   Glucose, Bld 99 65 - 99 mg/dL   BUN 8 7 - 25 mg/dL   Creat 0.93 0.50 - 0.99 mg/dL   GFR, Est Non African American 66 > OR = 60 mL/min/1.68m2   GFR, Est African American 76 > OR = 60 mL/min/1.6m2   BUN/Creatinine Ratio NOT APPLICABLE 6 - 22 (calc)   Sodium 141 135 - 146 mmol/L   Potassium 3.8 3.5 - 5.3 mmol/L   Chloride 106 98 - 110 mmol/L   CO2 27 20 - 32 mmol/L   Calcium 9.6 8.6 - 10.4 mg/dL   Total Protein 7.0 6.1 - 8.1 g/dL   Albumin 4.0 3.6 - 5.1 g/dL   Globulin 3.0 1.9 - 3.7 g/dL (calc)   AG Ratio 1.3 1.0 - 2.5 (calc)   Total Bilirubin 0.3 0.2 - 1.2 mg/dL   Alkaline phosphatase (APISO) 132 (H) 33 - 130 U/L   AST 14 10 - 35 U/L   ALT 14 6 - 29 U/L  Lipid panel  Result Value Ref Range   Cholesterol 171 <200 mg/dL   HDL 54 >50 mg/dL   Triglycerides 95 <150 mg/dL   LDL Cholesterol (Calc) 98 mg/dL (calc)   Total CHOL/HDL Ratio 3.2 <5.0 (calc)   Non-HDL Cholesterol (Calc) 117 <130 mg/dL (calc)  CBC with Differential/Platelet  Result Value Ref Range   WBC 4.9 3.8 - 10.8 Thousand/uL   RBC 4.64 3.80 - 5.10 Million/uL   Hemoglobin 11.7 11.7 - 15.5 g/dL   HCT 36.7 35.0 - 45.0 %  MCV 79.1 (L) 80.0 - 100.0 fL   MCH 25.2 (L) 27.0 - 33.0 pg   MCHC 31.9 (L) 32.0 -  36.0 g/dL   RDW 14.4 11.0 - 15.0 %   Platelets 362 140 - 400 Thousand/uL   MPV 10.3 7.5 - 12.5 fL   Neutro Abs 2,563 1,500 - 7,800 cells/uL   Lymphs Abs 1,632 850 - 3,900 cells/uL   WBC mixed population 627 200 - 950 cells/uL   Eosinophils Absolute 59 15 - 500 cells/uL   Basophils Absolute 20 0 - 200 cells/uL   Neutrophils Relative % 52.3 %   Total Lymphocyte 33.3 %   Monocytes Relative 12.8 %   Eosinophils Relative 1.2 %   Basophils Relative 0.4 %      Assessment & Plan:   Problem List Items Addressed This Visit      Cardiovascular and Mediastinum   Hypertension    Increase the ARB from 50 mg daily to 100 mg daily; limit salt; return in two weeks for recheck BP with CMA and BMP      Relevant Medications   losartan (COZAAR) 100 MG tablet     Other   Medication monitoring encounter    Check BMP in two weeks with increase of ARB      Relevant Orders   Basic metabolic panel       Follow up plan: Return in about 2 weeks (around 08/15/2017) for blood pressure recheck with CMA; your next A1c is due June 20th or just after.  An after-visit summary was printed and given to the patient at [place].  Please see the patient instructions which may contain other information and recommendations beyond what is mentioned above in the assessment and plan.  Meds ordered this encounter  Medications  . losartan (COZAAR) 100 MG tablet    Sig: Take 1 tablet (100 mg total) by mouth daily.    Dispense:  90 tablet    Refill:  3    Increasing dose, cancel the 50 mg refills; okay to leave on file until she needs it    Orders Placed This Encounter  Procedures  . Basic metabolic panel

## 2017-08-15 ENCOUNTER — Ambulatory Visit: Payer: 59

## 2017-08-15 DIAGNOSIS — Z5181 Encounter for therapeutic drug level monitoring: Secondary | ICD-10-CM

## 2017-08-15 LAB — BASIC METABOLIC PANEL
BUN: 10 mg/dL (ref 7–25)
CO2: 28 mmol/L (ref 20–32)
Calcium: 9.5 mg/dL (ref 8.6–10.4)
Chloride: 108 mmol/L (ref 98–110)
Creat: 0.79 mg/dL (ref 0.50–0.99)
Glucose, Bld: 120 mg/dL (ref 65–139)
Potassium: 3.8 mmol/L (ref 3.5–5.3)
Sodium: 143 mmol/L (ref 135–146)

## 2017-08-16 ENCOUNTER — Other Ambulatory Visit: Payer: Self-pay | Admitting: Family Medicine

## 2017-08-18 ENCOUNTER — Telehealth: Payer: Self-pay

## 2017-08-18 NOTE — Telephone Encounter (Signed)
Called pt no answer. LM for pt informing her of normal labs, CRM created. Pt to call back for questions or concerns. Labs routed to [facility].

## 2017-09-04 ENCOUNTER — Ambulatory Visit: Payer: Self-pay | Admitting: *Deleted

## 2017-09-04 NOTE — Telephone Encounter (Signed)
Patient has symptoms of r upper chest pain with symptoms of belching not relieved by anti acids. History of hypertension and Pre diabetes. Duration of pain documented below should be for several minutes not several months. Pt advised to proceed to nearest ER for  Evaluation.    Reason for Disposition . Chest pain lasts > 5 minutes (Exceptions: chest pain occurring > 3 days ago and now asymptomatic; same as previously diagnosed heartburn and has accompanying sour taste in mouth)  Answer Assessment - Initial Assessment Questions 1. LOCATION: "Where does it hurt?"        R upper  Chest   2. RADIATION: "Does the pain go anywhere else?" (e.g., into neck, jaw, arms, back)      No 3. ONSET: "When did the chest pain begin?" (Minutes, hours or days)        4   And   1/2  Days  4. PATTERN "Does the pain come and go, or has it been constant since it started?"  "Does it get worse with exertion?"       Constant  At times  Gets  Work   5. DURATION: "How long does it last" (e.g., seconds, minutes, hours)       Episodes  Last  sev  Months   6. SEVERITY: "How bad is the pain?"  (e.g., Scale 1-10; mild, moderate, or severe)    - MILD (1-3): doesn't interfere with normal activities     - MODERATE (4-7): interferes with normal activities or awakens from sleep    - SEVERE (8-10): excruciating pain, unable to do any normal activities         2    7. CARDIAC RISK FACTORS: "Do you have any history of heart problems or risk factors for heart disease?" (e.g., prior heart attack, angina; high blood pressure, diabetes, being overweight, high cholesterol, smoking, or strong family history of heart disease)     Pre  Diabetic  HTN   8. PULMONARY RISK FACTORS: "Do you have any history of lung disease?"  (e.g., blood clots in lung, asthma, emphysema, birth control pills)       NO 9. CAUSE: "What do you think is causing the chest pain?"      History  Of  Reflux    Has  Taken otc  meds  Without  Without  releif  10. OTHER  SYMPTOMS: "Do you have any other symptoms?" (e.g., dizziness, nausea, vomiting, sweating, fever, difficulty breathing, cough)       No 11. PREGNANCY: "Is there any chance you are pregnant?" "When was your last menstrual period?"       n/a  Protocols used: CHEST PAIN-A-AH

## 2017-09-10 ENCOUNTER — Ambulatory Visit: Payer: 59 | Admitting: Family Medicine

## 2017-09-15 ENCOUNTER — Ambulatory Visit: Payer: 59 | Admitting: Nurse Practitioner

## 2017-09-18 ENCOUNTER — Ambulatory Visit: Payer: 59 | Admitting: Family Medicine

## 2017-10-20 ENCOUNTER — Ambulatory Visit: Payer: 59 | Admitting: Family Medicine

## 2017-10-30 ENCOUNTER — Ambulatory Visit: Payer: 59 | Admitting: Family Medicine

## 2017-11-09 NOTE — Progress Notes (Signed)
Cardiology Office Note  Date:  11/11/2017   ID:  [name], DOB 1955-10-14, MRN [id number]  PCP:  Arnetha Courser, MD   Chief Complaint  Patient presents with  . other    Chest pain, elevated BP and unable to sleep well. Meds reviewed verbally with pt.    HPI:  [name] is a pleasant 62 year old woman with past medical history of pituitary adenoma s/p resection hypertension Nonsmoking Nondiabetic Who presents by referral from Dr. Enid Derry for labile hypertension and chest pain  reports having several episodes of chest pain back in March and April, one since then Blood pressure was running high for several weeks prior to her episodes  07/24/2017 ER for chest pain, HTN Was having some leg swelling Workup essentially negative Hospital records reviewed with the patient in detail May have been going through relationship issues at the time, lots of stress  09/04/2017 Ate peanuts, layed down, developed chest pain Pain continued, BP went high Went to the ER CXR, EKG, head MRI discharged  Very active at baseline, helps to take care of her sister who is ill has cancer Chronic Lack of sleep  periodic leg swelling Eats out most of her meals  EKG personally reviewed by myself on todays visit shows normal sinus rhythm with rate 76 bpm no significant ST or T-wave changes   PMH:   has a past medical history of Anemia, Benign hypertensive renal disease, DDD (degenerative disc disease), lumbar, Elevated alkaline phosphatase level, Fibrocystic disease of both breasts, GERD (gastroesophageal reflux disease), Hyperlipidemia, Hypertension, Impaired fasting glucose, Insomnia, Menopausal state, Migraine, Osteoarthritis, 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  PSH:    Past Surgical History:  Procedure Laterality Date  .  ABDOMINAL HYSTERECTOMY     partial with bladder suspension, no cervix  . CHOLECYSTECTOMY  2003  . COLONOSCOPY  2008   1 polyp- return 10 years  . COLONOSCOPY WITH PROPOFOL N/A 06/30/2017   Procedure: COLONOSCOPY WITH PROPOFOL;  Surgeon: Lucilla Lame, MD;  Location: [facility];  Service: Endoscopy;  Laterality: N/A;  slep apnea  . KNEE SURGERY Right 2014  . PITUITARY SURGERY     x2 30+ years ago  . transnasal approach pituitary adenoma resection  2015   clival/parasellar mass    Current Outpatient Medications  Medication Sig Dispense Refill  . amLODipine (NORVASC) 10 MG tablet TAKE 1 TABLET (10 MG TOTAL) BY MOUTH DAILY. FOR BLOOD PRESSURE 30 tablet 11  . atorvastatin (LIPITOR) 20 MG tablet Take 1 tablet (20 mg total) by mouth at bedtime. 90 tablet 1  . fluticasone (FLONASE) 50 MCG/ACT nasal spray Place 2 sprays into both nostrils daily. (Patient taking differently: Place 2 sprays into both nostrils as needed. ) 16 g 6  . losartan (COZAAR) 100 MG tablet Take 1 tablet (100 mg total) by mouth daily. 90 tablet 3  . metFORMIN (GLUCOPHAGE) 500 MG tablet Take 500 mg by mouth every other day.   3  . polyethylene glycol powder (GLYCOLAX/MIRALAX) powder TAKE 255 G BY MOUTH ONCE.  0   No current facility-administered medications for this visit.      Allergies:   Lisinopril; Shrimp [shellfish allergy]; and Sulfa antibiotics   Social History:  The patient  reports that she is a non-smoker but has been exposed to tobacco smoke. She has never used smokeless tobacco. She reports that she does not drink  alcohol or use drugs.   Family History:   family history includes Alzheimer's disease in her father; Atrial fibrillation in her sister; Breast cancer in her maternal grandmother; Cancer in her mother and sister; Diabetes in her mother and sister; Heart attack in her father; Heart disease in her father; Hypertension in her father, mother, and sister; Stroke in her sister.    Review of  Systems: Review of Systems  Constitutional: Negative.   Respiratory: Negative.   Cardiovascular: Negative.   Gastrointestinal: Negative.   Musculoskeletal: Negative.   Neurological: Negative.   Psychiatric/Behavioral: Negative.   All other systems reviewed and are negative.    PHYSICAL EXAM: VS:  BP 129/89 (BP Location: Right Arm, Patient Position: Sitting, Cuff Size: Large)   Pulse 76   Ht 5\' 6"  (1.676 m)   Wt 208 lb 8 oz (94.6 kg)   BMI 33.65 kg/m  , BMI Body mass index is 33.65 kg/m. GEN: Well nourished, well developed, in no acute distress  HEENT: normal  Neck: no JVD, carotid bruits, or masses Cardiac: RRR; no murmurs, rubs, or gallops,no edema  Respiratory:  clear to auscultation bilaterally, normal work of breathing GI: soft, nontender, nondistended, + BS MS: no deformity or atrophy  Skin: warm and dry, no rash Neuro:  Strength and sensation are intact Psych: euthymic mood, full affect   Recent Labs: 04/30/2017: ALT 14; Hemoglobin 11.7; Platelets 362 08/15/2017: BUN 10; Creat 0.79; Potassium 3.8; Sodium 143    Lipid Panel Lab Results  Component Value Date   CHOL 171 04/30/2017   HDL 54 04/30/2017   LDLCALC 98 04/30/2017   TRIG 95 04/30/2017      Wt Readings from Last 3 Encounters:  11/11/17 208 lb 8 oz (94.6 kg)  11/10/17 208 lb 3.2 oz (94.4 kg)  08/01/17 208 lb 1.6 oz (94.4 kg)      ASSESSMENT AND PLAN:  Benign hypertensive renal disease - Plan: EKG 12-Lead He eats out frequently does not like to cook Blood pressure well controlled on today's visit Previous spikes in blood pressure likely associated with stress, possibly even a relationship issue, unable to exclude situational issues We have recommended she take Lasix sparingly for any leg swelling, high blood pressure. Likely high salt and fluid intake.  If she has spikes in blood pressures with no warnings, recommended she look into her stressors at the time. For significant stress might need  short acting benzodiazepines Currently she states that she is in a good place emotionally and blood pressure has not been an issue  Mixed hyperlipidemia Lipitor refilled Cholesterol aggressively low  Sleep apnea, unspecified type Will defer management to primary care She may benefit from sleep study  Impaired fasting glucose We have encouraged continued exercise, careful diet management in an effort to lose weight.she does eat out frequently  Essential hypertension As above will recommend , Lasix periodically for high blood pressure and leg swellingwith over-the-counter potassium  continue to losartan and amlodipine   Disposition:   F/U as needed   Total encounter time more than 60 minutes  Greater than 50% was spent in counseling and coordination of care with the patient  Patient was seen in consultation for  Dr. Enid Derry and will be referred back to her office for ongoing care of issues detailed above   Orders Placed This Encounter  Procedures  . EKG 12-Lead     Signed, Esmond Plants, M.D., Ph.D. 11/11/2017  [place], [place]

## 2017-11-10 ENCOUNTER — Ambulatory Visit (INDEPENDENT_AMBULATORY_CARE_PROVIDER_SITE_OTHER): Payer: 59 | Admitting: Family Medicine

## 2017-11-10 ENCOUNTER — Encounter: Payer: Self-pay | Admitting: Family Medicine

## 2017-11-10 VITALS — BP 144/96 | HR 69 | Temp 98.3°F | Resp 16 | Ht 66.0 in | Wt 208.2 lb

## 2017-11-10 DIAGNOSIS — K449 Diaphragmatic hernia without obstruction or gangrene: Secondary | ICD-10-CM

## 2017-11-10 DIAGNOSIS — R7303 Prediabetes: Secondary | ICD-10-CM

## 2017-11-10 DIAGNOSIS — I10 Essential (primary) hypertension: Secondary | ICD-10-CM

## 2017-11-10 DIAGNOSIS — D485 Neoplasm of uncertain behavior of skin: Secondary | ICD-10-CM

## 2017-11-10 DIAGNOSIS — R7301 Impaired fasting glucose: Secondary | ICD-10-CM | POA: Diagnosis not present

## 2017-11-10 DIAGNOSIS — K219 Gastro-esophageal reflux disease without esophagitis: Secondary | ICD-10-CM

## 2017-11-10 DIAGNOSIS — R079 Chest pain, unspecified: Secondary | ICD-10-CM

## 2017-11-10 DIAGNOSIS — E782 Mixed hyperlipidemia: Secondary | ICD-10-CM

## 2017-11-10 NOTE — Assessment & Plan Note (Signed)
Try conservative measures first; to specialist if worsening; see AVS

## 2017-11-10 NOTE — Progress Notes (Signed)
BP (!) 144/96 (BP Location: Left Arm, Patient Position: Sitting, Cuff Size: Large)   Pulse 69   Temp 98.3 F (36.8 C) (Oral)   Resp 16   Ht 5\' 6"  (1.676 m)   Wt 208 lb 3.2 oz (94.4 kg)   SpO2 99%   BMI 33.60 kg/m    Subjective:    Patient ID: [name], female    DOB: 05-25-1955, 62 y.o.   MRN: [id number]  HPI: [name] is a 62 y.o. female  Chief Complaint  Patient presents with  . Follow-up    patient is here for her 6 month f/u  . Hypertension    patient has been elevated lately. is taking care of her sick (cancer) sister who lives in [place]. patient stated that she has been having some bilateral swelling in the lower extremities.  . Insomnia    patient stated that she was called for an appt and she never f/u.  Marland Kitchen Insect Bite    patient stated that she noticed 2 bites on her bottom that itches and is hard  . Referral    stress test is scheduled for 11/11/17 @ 10:20    HPI Patient is here for HTN, follow up of other things Did not take any other BP meds this morning; the amlodipine was causing some swelling in her legs Under more stress with sister's diagnosis of cancer Feet and legs were swelling a lot Not using much salt at all; when going out, not adding salt to her food; not cooking with salt Gets craving for chips  She has insomnia; not sleeping very well; taking care of sister; she fell and she is jumping at every sound; so much on her plate and on her mind  She had insect bites on her bottom; feeling indurated; it was a spider she thinks; the first was on the left; staying with her sister; had sprayed her house because she had seen them in her house; slept on the couch, dreaming that she was scratching; felt hard to the touch; could not even wake up to get it off  She has a stress test scheduled for tomorrow morning; she went to the ER in April; she thinks she missed an earlier appointment  She is feeling like her hiatal hernia is acting up; 3 months  ago started; worse with larger portions and lettuce; not getting stuck further up; was told by the endoscopist that she had a colonoscopy; Dr. Allen Norris (did colonoscopy in Feb 2019); no blood in stool, no unexplained weight loss  Depression screen [facility] 2/9 11/10/2017 11/10/2017 07/25/2017 04/30/2017 02/19/2016  Decreased Interest 0 0 0 0 0  Down, Depressed, Hopeless 0 0 0 1 0  PHQ - 2 Score 0 0 0 1 0  Altered sleeping 1 - - - -  Tired, decreased energy 3 - - - -  Change in appetite 0 - - - -  Feeling bad or failure about yourself  0 - - - -  Trouble concentrating 0 - - - -  Moving slowly or fidgety/restless 0 - - - -  Suicidal thoughts 0 - - - -  PHQ-9 Score 4 - - - -    Relevant past medical, surgical, family and social history reviewed Past Medical History:  Diagnosis Date  . Anemia   . Benign hypertensive renal disease   . DDD (degenerative disc disease), lumbar   . Elevated alkaline phosphatase level   . Fibrocystic disease of both breasts   .  GERD (gastroesophageal reflux disease)   . Hyperlipidemia   . Hypertension   . Impaired fasting glucose   . Insomnia   . Menopausal state   . Migraine   . Osteoarthritis   . 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 Perforation of nasal septum   . Pre-diabetes   . Recurrent major depression ([place])   . Sleep apnea    Does not use CPAP  . Spondylosis   . Thrombocytosis ([place]) 10/01/2015   mild  . Wears dentures    full upper   Past Surgical History:  Procedure Laterality Date  . ABDOMINAL HYSTERECTOMY     partial with bladder suspension, no cervix  . CHOLECYSTECTOMY  2003  . COLONOSCOPY  2008   1 polyp- return 10 years  . COLONOSCOPY WITH PROPOFOL N/A 06/30/2017   Procedure: COLONOSCOPY WITH PROPOFOL;  Surgeon: Lucilla Lame, MD;  Location: [facility];  Service: Endoscopy;  Laterality: N/A;  slep apnea  . KNEE SURGERY Right 2014  . PITUITARY SURGERY     x2 30+ years ago  . transnasal approach pituitary adenoma resection  2015    clival/parasellar mass   Family History  Problem Relation Age of Onset  . Diabetes Mother   . Hypertension Mother   . Cancer Mother        possible liver  . Heart disease Father   . Hypertension Father   . Heart attack Father   . Alzheimer's disease Father   . Diabetes Sister   . Hypertension Sister   . Cancer Sister        lung?  . Stroke Sister   . Atrial fibrillation Sister   . Breast cancer Maternal Grandmother    Social History   Tobacco Use  . Smoking status: Never Smoker  . Smokeless tobacco: Never Used  Substance Use Topics  . Alcohol use: No    Comment: rare (1-2 drinks/yr)  . Drug use: No    Interim medical history since last visit reviewed. Allergies and medications reviewed  Review of Systems Per HPI unless specifically indicated above     Objective:    BP (!) 144/96 (BP Location: Left Arm, Patient Position: Sitting, Cuff Size: Large)   Pulse 69   Temp 98.3 F (36.8 C) (Oral)   Resp 16   Ht 5\' 6"  (1.676 m)   Wt 208 lb 3.2 oz (94.4 kg)   SpO2 99%   BMI 33.60 kg/m   Wt Readings from Last 3 Encounters:  11/10/17 208 lb 3.2 oz (94.4 kg)  08/01/17 208 lb 1.6 oz (94.4 kg)  07/25/17 208 lb 1.6 oz (94.4 kg)    Physical Exam  Constitutional: She appears well-developed and well-nourished. No distress.  Eyes: EOM are normal.  Cardiovascular: Normal rate and regular rhythm.  Pulmonary/Chest: Effort normal and breath sounds normal.  Abdominal: She exhibits no distension.  Musculoskeletal: She exhibits no edema.  Neurological: She is alert.  Skin: Skin is warm.     5 x 4 mm very dark macular lesion LEFT buttock; just distal, bigger than 50 cent piece area of macerated erythematous skin; RIGHT buttock dime sized erythema  Psychiatric: Her mood appears not anxious. She does not exhibit a depressed mood.    Results for orders placed or performed in visit on 82/95/62  Basic metabolic panel  Result Value Ref Range   Glucose, Bld 120 65 - 139 mg/dL    BUN 10 7 - 25 mg/dL   Creat 0.79 0.50 -  0.99 mg/dL   BUN/Creatinine Ratio NOT APPLICABLE 6 - 22 (calc)   Sodium 143 135 - 146 mmol/L   Potassium 3.8 3.5 - 5.3 mmol/L   Chloride 108 98 - 110 mmol/L   CO2 28 20 - 32 mmol/L   Calcium 9.5 8.6 - 10.4 mg/dL      Assessment & Plan:   Problem List Items Addressed This Visit      Cardiovascular and Mediastinum   Hypertension    Not controlled today, but she has not taken her medicine today; try DASH guidelines and anxiety reduction, see AVS        Respiratory   Hiatal hernia - Primary    Try conservative measures first; to specialist if worsening; see AVS        Digestive   GERD (gastroesophageal reflux disease)    Avoid triggers        Endocrine   Impaired fasting glucose    Check glucose (fasting) and A1c at next visit in a few weeks        Other   Prediabetes    Check glucose and A1c      Hyperlipidemia    Check labs at f/u in a few weeks       Other Visit Diagnoses    Neoplasm of uncertain behavior of skin of buttock       worrisome lesion; refer to derm   Relevant Orders   Ambulatory referral to Dermatology   Chest pain, unspecified type       okay for referral to cardiology tomorrow; Dr. Donivan Scull office actually called during our visit, referral entered   Relevant Orders   Ambulatory referral to Cardiology       Follow up plan: Return in about 1 month (around 12/08/2017) for twenty minute follow-up with fasting labs.  An after-visit summary was printed and given to the patient at [facility].  Please see the patient instructions which may contain other information and recommendations beyond what is mentioned above in the assessment and plan.  No orders of the defined types were placed in this encounter.   Orders Placed This Encounter  Procedures  . Ambulatory referral to Dermatology  . Ambulatory referral to Cardiology

## 2017-11-10 NOTE — Patient Instructions (Addendum)
Let us know what strength of amlodipine you're taking and if need refills   Steps to Elicit the Relaxation Response The following is the technique reprinted with permission from Dr. Billie Ruddy book The Relaxation Response pages 162-163 1. Sit quietly in a comfortable position. 2. Close your eyes. 3. Deeply relax all your muscles,  beginning at your feet and progressing up to your face.  Keep them relaxed. 4. Breathe through your nose.  Become aware of your breathing.  As you breathe out, say the word, "one"*,  silently to yourself. For example,  breathe in ... out, "one",- in .. out, "one", etc.  Breathe easily and naturally. 5. Continue for 10 to 20 minutes.  You may open your eyes to check the time, but do not use an alarm.  When you finish, sit quietly for several minutes,  at first with your eyes closed and later with your eyes opened.  Do not stand up for a few minutes. 6. Do not worry about whether you are successful  in achieving a deep level of relaxation.  Maintain a passive attitude and permit relaxation to occur at its own pace.  When distracting thoughts occur,  try to ignore them by not dwelling upon them  and return to repeating "one."  With practice, the response should come with little effort.  Practice the technique once or twice daily,  but not within two hours after any meal,  since the digestive processes seem to interfere with  the elicitation of the Relaxation Response. * It is better to use a soothing, mellifluous sound, preferably with no meaning. or association, to avoid stimulation of unnecessary thoughts - a mantra.    12 Ways to Curb Anxiety  ?Anxiety is normal human sensation. It is what helped our ancestors survive the pitfalls of the wilderness. Anxiety is defined as experiencing worry or nervousness about an imminent event or something with an uncertain outcome. It is a feeling experienced by most people at some point in their lives.  Anxiety can be triggered by a very personal issue, such as the illness of a loved one, or an event of global proportions, such as a refugee crisis. Some of the symptoms of anxiety are:  Feeling restless.  Having a feeling of impending danger.  Increased heart rate.  Rapid breathing. Sweating.  Shaking.  Weakness or feeling tired.  Difficulty concentrating on anything except the current worry.  Insomnia.  Stomach or bowel problems. What can we do about anxiety we may be feeling? There are many techniques to help manage stress and relax. Here are 12 ways you can reduce your anxiety almost immediately: 1. Turn off the constant feed of information. Take a social media sabbatical. Studies have shown that social media directly contributes to social anxiety.  2. Monitor your television viewing habits. Are you watching shows that are also contributing to your anxiety, such as 24-hour news stations? Try watching something else, or better yet, nothing at all. Instead, listen to music, read an inspirational book or practice a hobby. 3. Eat nutritious meals. Also, don't skip meals and keep healthful snacks on hand. Hunger and poor diet contributes to feeling anxious. 4. Sleep. Sleeping on a regular schedule for at least seven to eight hours a night will do wonders for your outlook when you are awake. 5. Exercise. Regular exercise will help rid your body of that anxious energy and help you get more restful sleep. 6. Try deep (diaphragmatic) breathing. Inhale slowly through your nose  for five seconds and exhale through your mouth. 7. Practice acceptance and gratitude. When anxiety hits, accept that there are things out of your control that shouldn't be of immediate concern.  8. Seek out humor. When anxiety strikes, watch a funny video, read jokes or call a friend who makes you laugh. Laughter is healing for our bodies and releases endorphins that are calming. 9. Stay positive. Take the effort to replace  negative thoughts with positive ones. Try to see a stressful situation in a positive light. Try to come up with solutions rather than dwelling on the problem. 10. Figure out what triggers your anxiety. Keep a journal and make note of anxious moments and the events surrounding them. This will help you identify triggers you can avoid or even eliminate. 11. Talk to someone. Let a trusted friend, family member or even trained professional know that you are feeling overwhelmed and anxious. Verbalize what you are feeling and why.  12. Volunteer. If your anxiety is triggered by a crisis on a large scale, become an advocate and work to resolve the problem that is causing you unease. Anxiety is often unwelcome and can become overwhelming. If not kept in check, it can become a disorder that could require medical treatment. However, if you take the time to care for yourself and avoid the triggers that make you anxious, you will be able to find moments of relaxation and clarity that make your life much more enjoyable.    Hernia, Adult A hernia is the bulging of an organ or tissue through a weak spot in the muscles of the abdomen (abdominal wall). Hernias develop most often near the navel or groin. There are many kinds of hernias. Common kinds include:  Femoral hernia. This kind of hernia develops under the groin in the upper thigh area.  Inguinal hernia. This kind of hernia develops in the groin or scrotum.  Umbilical hernia. This kind of hernia develops near the navel.  Hiatal hernia. This kind of hernia causes part of the stomach to be pushed up into the chest.  Incisional hernia. This kind of hernia bulges through a scar from an abdominal surgery.  What are the causes? This condition may be caused by:  Heavy lifting.  Coughing over a long period of time.  Straining to have a bowel movement.  An incision made during an abdominal surgery.  A birth defect (congenital defect).  Excess weight or  obesity.  Smoking.  Poor nutrition.  Cystic fibrosis.  Excess fluid in the abdomen.  Undescended testicles.  What are the signs or symptoms? Symptoms of a hernia include:  A lump on the abdomen. This is the first sign of a hernia. The lump may become more obvious with standing, straining, or coughing. It may get bigger over time if it is not treated or if the condition causing it is not treated.  Pain. A hernia is usually painless, but it may become painful over time if treatment is delayed. The pain is usually dull and may get worse with standing or lifting heavy objects.  Sometimes a hernia gets tightly squeezed in the weak spot (strangulated) or stuck there (incarcerated) and causes additional symptoms. These symptoms may include:  Vomiting.  Nausea.  Constipation.  Irritability.  How is this diagnosed? A hernia may be diagnosed with:  A physical exam. During the exam your health care provider may ask you to cough or to make a specific movement, because a hernia is usually more visible when you  move.  Imaging tests. These can include: ? X-rays. ? Ultrasound. ? CT scan.  How is this treated? A hernia that is small and painless may not need to be treated. A hernia that is large or painful may be treated with surgery. Inguinal hernias may be treated with surgery to prevent incarceration or strangulation. Strangulated hernias are always treated with surgery, because lack of blood to the trapped organ or tissue can cause it to die. Surgery to treat a hernia involves pushing the bulge back into place and repairing the weak part of the abdomen. Follow these instructions at home:  Avoid straining.  Do not lift anything heavier than 10 lb (4.5 kg).  Lift with your leg muscles, not your back muscles. This helps avoid strain.  When coughing, try to cough gently.  Prevent constipation. Constipation leads to straining with bowel movements, which can make a hernia worse or  cause a hernia repair to break down. You can prevent constipation by: ? Eating a high-fiber diet that includes plenty of fruits and vegetables. ? Drinking enough fluids to keep your urine clear or pale yellow. Aim to drink 6-8 glasses of water per day. ? Using a stool softener as directed by your health care provider.  Lose weight, if you are overweight.  Do not use any tobacco products, including cigarettes, chewing tobacco, or electronic cigarettes. If you need help quitting, ask your health care provider.  Keep all follow-up visits as directed by your health care provider. This is important. Your health care provider may need to monitor your condition. Contact a health care provider if:  You have swelling, redness, and pain in the affected area.  Your bowel habits change. Get help right away if:  You have a fever.  You have abdominal pain that is getting worse.  You feel nauseous or you vomit.  You cannot push the hernia back in place by gently pressing on it while you are lying down.  The hernia: ? Changes in shape or size. ? Is stuck outside the abdomen. ? Becomes discolored. ? Feels hard or tender. This information is not intended to replace advice given to you by your health care provider. Make sure you discuss any questions you have with your health care provider. Document Released: 04/29/2005 Document Revised: 09/27/2015 Document Reviewed: 03/09/2014 Elsevier Interactive Patient Education  2017 [place] out the information at [employer] entitled "Nutrition for Weight Loss: What You Need to Know about Fad Diets" Try to lose between 1-2 pounds per week by taking in fewer calories and burning off more calories You can succeed by limiting portions, limiting foods dense in calories and fat, becoming more active, and drinking 8 glasses of water a day (64 ounces) Don't skip meals, especially breakfast, as skipping meals may alter your metabolism Do not use  over-the-counter weight loss pills or gimmicks that claim rapid weight loss A healthy BMI (or body mass index) is between 18.5 and 24.9 You can calculate your ideal BMI at the [place] website ClubMonet[employer].fr

## 2017-11-10 NOTE — Assessment & Plan Note (Signed)
Check glucose (fasting) and A1c at next visit in a few weeks

## 2017-11-10 NOTE — Assessment & Plan Note (Signed)
Not controlled today, but she has not taken her medicine today; try DASH guidelines and anxiety reduction, see AVS

## 2017-11-10 NOTE — Assessment & Plan Note (Signed)
Check labs at f/u in a few weeks

## 2017-11-10 NOTE — Assessment & Plan Note (Signed)
Check glucose and A1c 

## 2017-11-10 NOTE — Assessment & Plan Note (Signed)
Avoid triggers 

## 2017-11-11 ENCOUNTER — Encounter: Payer: Self-pay | Admitting: Cardiovascular Disease

## 2017-11-11 ENCOUNTER — Ambulatory Visit (INDEPENDENT_AMBULATORY_CARE_PROVIDER_SITE_OTHER): Payer: 59 | Admitting: Cardiovascular Disease

## 2017-11-11 VITALS — BP 129/89 | HR 76 | Ht 66.0 in | Wt 208.5 lb

## 2017-11-11 DIAGNOSIS — I10 Essential (primary) hypertension: Secondary | ICD-10-CM

## 2017-11-11 DIAGNOSIS — G4700 Insomnia, unspecified: Secondary | ICD-10-CM

## 2017-11-11 DIAGNOSIS — R7301 Impaired fasting glucose: Secondary | ICD-10-CM

## 2017-11-11 DIAGNOSIS — G4730 Sleep apnea, unspecified: Secondary | ICD-10-CM | POA: Diagnosis not present

## 2017-11-11 DIAGNOSIS — E782 Mixed hyperlipidemia: Secondary | ICD-10-CM

## 2017-11-11 DIAGNOSIS — I129 Hypertensive chronic kidney disease with stage 1 through stage 4 chronic kidney disease, or unspecified chronic kidney disease: Secondary | ICD-10-CM | POA: Diagnosis not present

## 2017-11-11 DIAGNOSIS — E785 Hyperlipidemia, unspecified: Secondary | ICD-10-CM

## 2017-11-11 MED ORDER — FUROSEMIDE 20 MG PO TABS: 20.0000 mg | ORAL_TABLET | Freq: Every day | ORAL | 3 refills | Status: DC | PRN

## 2017-11-11 MED ORDER — ATORVASTATIN CALCIUM 20 MG PO TABS: 20.0000 mg | ORAL_TABLET | Freq: Every day | ORAL | 3 refills | Status: DC

## 2017-11-11 NOTE — Patient Instructions (Addendum)
Medication Instructions:   Please take lasix sparingly for high blood pressure Take with potassium (pill OTC or banana)   Labwork:  No new labs needed  Testing/Procedures:  No further testing at this time   Follow-Up: It was a pleasure seeing you in the office today. Please call us if you have new issues that need to be addressed before your next appt.  [phone]  Your physician wants you to follow-up in:  As needed  If you need a refill on your cardiac medications before your next appointment, please call your pharmacy.  For educational health videos Log in to : [email or web address] Or : [email or web address], password : [email or web address]

## 2017-12-11 ENCOUNTER — Ambulatory Visit (INDEPENDENT_AMBULATORY_CARE_PROVIDER_SITE_OTHER): Payer: 59 | Admitting: Family Medicine

## 2017-12-11 ENCOUNTER — Encounter: Payer: Self-pay | Admitting: Family Medicine

## 2017-12-11 VITALS — BP 124/78 | HR 74 | Temp 98.2°F | Resp 12 | Ht 66.0 in | Wt 207.6 lb

## 2017-12-11 DIAGNOSIS — Z636 Dependent relative needing care at home: Secondary | ICD-10-CM

## 2017-12-11 DIAGNOSIS — K219 Gastro-esophageal reflux disease without esophagitis: Secondary | ICD-10-CM

## 2017-12-11 DIAGNOSIS — Z5181 Encounter for therapeutic drug level monitoring: Secondary | ICD-10-CM

## 2017-12-11 DIAGNOSIS — I10 Essential (primary) hypertension: Secondary | ICD-10-CM | POA: Diagnosis not present

## 2017-12-11 DIAGNOSIS — F3341 Major depressive disorder, recurrent, in partial remission: Secondary | ICD-10-CM

## 2017-12-11 DIAGNOSIS — E220 Acromegaly and pituitary gigantism: Secondary | ICD-10-CM

## 2017-12-11 DIAGNOSIS — Z6833 Body mass index (BMI) 33.0-33.9, adult: Secondary | ICD-10-CM

## 2017-12-11 DIAGNOSIS — R195 Other fecal abnormalities: Secondary | ICD-10-CM | POA: Diagnosis not present

## 2017-12-11 DIAGNOSIS — R252 Cramp and spasm: Secondary | ICD-10-CM

## 2017-12-11 DIAGNOSIS — R7303 Prediabetes: Secondary | ICD-10-CM

## 2017-12-11 DIAGNOSIS — R7301 Impaired fasting glucose: Secondary | ICD-10-CM

## 2017-12-11 DIAGNOSIS — E6609 Other obesity due to excess calories: Secondary | ICD-10-CM

## 2017-12-11 DIAGNOSIS — E782 Mixed hyperlipidemia: Secondary | ICD-10-CM

## 2017-12-11 NOTE — Assessment & Plan Note (Signed)
Avoid triggers; avoid NSAIDs; stool cards given to be returned at her earliest convenience

## 2017-12-11 NOTE — Assessment & Plan Note (Signed)
Praise given for weight loss 

## 2017-12-11 NOTE — Assessment & Plan Note (Signed)
Check A1c today.

## 2017-12-11 NOTE — Assessment & Plan Note (Signed)
Patient does not wish to add medicine right now; she is not interested in medicine right now; caregiver stress discussed

## 2017-12-11 NOTE — Progress Notes (Signed)
BP 124/78   Pulse 74   Temp 98.2 F (36.8 C) (Oral)   Resp 12   Ht 5\' 6"  (1.676 m)   Wt 207 lb 9.6 oz (94.2 kg)   SpO2 97%   BMI 33.51 kg/m    Subjective:    Patient ID: [name], female    DOB: 20-Oct-1955, 62 y.o.   MRN: [id number]  HPI: [name] is a 62 y.o. female  Chief Complaint  Patient presents with  . Follow-up  . Leg Pain    crmaps    HPI She is here for f/u She is having leg pain since furosemide, added by cardiologist; taking every day and now to every other day She is having cramps since being on the new diuretic; taking BC powders  Stool turned dark but is back to normal; last week this happened, dark for a few days, no abd pain; UTD on colonoscopy, Feb 2019; normal acid reflux, nothing recently; had a tomato sandwich, acted up then  She is feeling better today, but wondering if sinus infection; ears are stopped up terribly; feeling weak feeling like she had before when she had an infection; had this before; didn't run fevers before;   cargiver stress; sister has cancer; staying there; going to take a break next weekend; not ready for medicine, but may call in the future  Prediabetes; wants to check A1c today; no dry mouth; room for improvement with diet  Depression screen [facility] 2/9 12/11/2017 11/10/2017 11/10/2017 07/25/2017 04/30/2017  Decreased Interest 0 0 0 0 0  Down, Depressed, Hopeless 1 0 0 0 1  PHQ - 2 Score 1 0 0 0 1  Altered sleeping 1 1 - - -  Tired, decreased energy 3 3 - - -  Change in appetite 2 0 - - -  Feeling bad or failure about yourself  0 0 - - -  Trouble concentrating 0 0 - - -  Moving slowly or fidgety/restless 0 0 - - -  Suicidal thoughts 0 0 - - -  PHQ-9 Score 7 4 - - -    Relevant past medical, surgical, family and social history reviewed Past Medical History:  Diagnosis Date  . Anemia   . Benign hypertensive renal disease   . DDD (degenerative disc disease), lumbar   . Elevated alkaline phosphatase level   .  Fibrocystic disease of both breasts   . GERD (gastroesophageal reflux disease)   . Hyperlipidemia   . Hypertension   . Impaired fasting glucose   . Insomnia   . Menopausal state   . Migraine   . Osteoarthritis   . 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 Perforation of nasal septum   . Pre-diabetes   . Recurrent major depression ([place])   . Sleep apnea    Does not use CPAP  . Spondylosis   . Thrombocytosis ([place]) 10/01/2015   mild  . Wears dentures    full upper   Past Surgical History:  Procedure Laterality Date  . ABDOMINAL HYSTERECTOMY     partial with bladder suspension, no cervix  . CHOLECYSTECTOMY  2003  . COLONOSCOPY  2008   1 polyp- return 10 years  . COLONOSCOPY WITH PROPOFOL N/A 06/30/2017   Procedure: COLONOSCOPY WITH PROPOFOL;  Surgeon: Lucilla Lame, MD;  Location: [facility];  Service: Endoscopy;  Laterality: N/A;  slep apnea  . KNEE SURGERY Right 2014  . PITUITARY SURGERY     x2 30+ years ago  .  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 Diabetes Mother   . Hypertension Mother   . Cancer Mother        possible liver  . Heart disease Father   . Hypertension Father   . Heart attack Father   . Alzheimer's disease Father   . Diabetes Sister   . Hypertension Sister   . Cancer Sister        lung?  . Stroke Sister   . Atrial fibrillation Sister   . Breast cancer Maternal Grandmother   . Cancer Sister   . Lymphoma Sister    Social History   Tobacco Use  . Smoking status: Passive Smoke Exposure - Never Smoker  . Smokeless tobacco: Never Used  Substance Use Topics  . Alcohol use: No    Comment: rare (1-2 drinks/yr)  . Drug use: No    Interim medical history since last visit reviewed. Allergies and medications reviewed  Review of Systems Per HPI unless specifically indicated above     Objective:    BP 124/78   Pulse 74   Temp 98.2 F (36.8 C) (Oral)    Resp 12   Ht 5\' 6"  (1.676 m)   Wt 207 lb 9.6 oz (94.2 kg)   SpO2 97%   BMI 33.51 kg/m   Wt Readings from Last 3 Encounters:  12/11/17 207 lb 9.6 oz (94.2 kg)  11/11/17 208 lb 8 oz (94.6 kg)  11/10/17 208 lb 3.2 oz (94.4 kg)    Physical Exam  Constitutional: She appears well-developed and well-nourished. No distress.  HENT:  Head: Normocephalic and atraumatic.  Right Ear: Tympanic membrane and ear canal normal.  Left Ear: Tympanic membrane and ear canal normal.  Nose: No mucosal edema or rhinorrhea.  Mouth/Throat: No posterior oropharyngeal edema or posterior oropharyngeal erythema.  Eyes: EOM are normal. No scleral icterus.  Neck: No thyromegaly present.  Cardiovascular: Normal rate, regular rhythm and normal heart sounds.  No murmur heard. Pulmonary/Chest: Effort normal and breath sounds normal. No respiratory distress. She has no wheezes.  Abdominal: Soft. Bowel sounds are normal. She exhibits no distension.  Musculoskeletal: She exhibits edema (trace pedal edema).  Neurological: She is alert. She exhibits normal muscle tone.  Skin: Skin is warm and dry. She is not diaphoretic. No pallor.  Psychiatric: She has a normal mood and affect. Her behavior is normal. Judgment and thought content normal.    Results for orders placed or performed in visit on 12/25/46  Basic metabolic panel  Result Value Ref Range   Glucose, Bld 120 65 - 139 mg/dL   BUN 10 7 - 25 mg/dL   Creat 0.79 0.50 - 0.99 mg/dL   BUN/Creatinine Ratio NOT APPLICABLE 6 - 22 (calc)   Sodium 143 135 - 146 mmol/L   Potassium 3.8 3.5 - 5.3 mmol/L   Chloride 108 98 - 110 mmol/L   CO2 28 20 - 32 mmol/L   Calcium 9.5 8.6 - 10.4 mg/dL      Assessment & Plan:   Problem List Items Addressed This Visit      Cardiovascular and Mediastinum   Hypertension    Tylenol preferred if needed; continue current regimen        Digestive   GERD (gastroesophageal reflux disease)    Avoid triggers; avoid NSAIDs; stool cards  given to be returned at her earliest convenience  Endocrine   Impaired fasting glucose    Check glucose and A1c      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[place])    Seeing specialist        Other   Recurrent major depression ([place])    Patient does not wish to add medicine right now; she is not interested in medicine right now; caregiver stress discussed      Relevant Medications   traZODone (DESYREL) 50 MG tablet   Prediabetes    Check A1c today      Relevant Orders   Hemoglobin A1c   Obesity    Praise given for weight loss      Medication monitoring encounter    Check liver and kidneys      Relevant Orders   CBC with Differential/Platelet   COMPLETE METABOLIC PANEL WITH GFR   Hyperlipidemia    Check lipids      Relevant Orders   Lipid panel    Other Visit Diagnoses    Dark stools    -  Primary   may have been from the increase in NSAIDs lately for leg cramps; stop the NSAIDs; stool cards given; notify me if recurs; colonoscopy UTD   Leg cramps       Relevant Orders   Magnesium   Caregiver stress       encouraged time for her, glad she is going to have some respite time; she declined counseling and med at this time       Follow up plan: Return in about 6 months (around 06/13/2018) for follow-up visit with Dr. Sanda Klein; sooner if needed.  An after-visit summary was printed and given to the patient at [facility].  Please see the patient instructions which may contain other information and recommendations beyond what is mentioned above in the assessment and plan.  No orders of the defined types were placed in this encounter.   Orders Placed This Encounter  Procedures  . Hemoglobin A1c  . Magnesium  . CBC with Differential/Platelet  . COMPLETE METABOLIC PANEL WITH GFR  . Lipid panel

## 2017-12-11 NOTE — Assessment & Plan Note (Signed)
Tylenol preferred if needed; continue current regimen

## 2017-12-11 NOTE — Assessment & Plan Note (Signed)
Seeing specialist.

## 2017-12-11 NOTE — Assessment & Plan Note (Signed)
Check glucose and A1c 

## 2017-12-11 NOTE — Assessment & Plan Note (Signed)
Check liver and kidneys 

## 2017-12-11 NOTE — Patient Instructions (Addendum)
If you need something for aches or pains, try to use Tylenol (acetaminophen) instead of non-steroidals (which include Aleve, ibuprofen, Advil, Motrin, and naproxen); non-steroidals can cause long-term kidney damage  Check out the information at [employer].org entitled "Nutrition for Weight Loss: What You Need to Know about Fad Diets" Try to lose between 1-2 pounds per week by taking in fewer calories and burning off more calories You can succeed by limiting portions, limiting foods dense in calories and fat, becoming more active, and drinking 8 glasses of water a day (64 ounces) Don't skip meals, especially breakfast, as skipping meals may alter your metabolism Do not use over-the-counter weight loss pills or gimmicks that claim rapid weight loss A healthy BMI (or body mass index) is between 18.5 and 24.9 You can calculate your ideal BMI at the [place] website ClubMonet[employer].fr

## 2017-12-11 NOTE — Assessment & Plan Note (Signed)
Check lipids 

## 2017-12-15 ENCOUNTER — Other Ambulatory Visit: Payer: Self-pay | Admitting: Family Medicine

## 2017-12-15 DIAGNOSIS — R748 Abnormal levels of other serum enzymes: Secondary | ICD-10-CM

## 2017-12-15 NOTE — Progress Notes (Signed)
Check alk phos isos and GGT

## 2017-12-16 ENCOUNTER — Other Ambulatory Visit: Payer: Self-pay

## 2017-12-16 DIAGNOSIS — R748 Abnormal levels of other serum enzymes: Secondary | ICD-10-CM

## 2017-12-23 LAB — TEST AUTHORIZATION 2

## 2017-12-23 LAB — ALKALINE PHOSPHATASE ISOENZYMES
Alkaline phosphatase (APISO): 146 U/L — ABNORMAL HIGH (ref 33–130)
Bone Isoenzymes: 27 % — ABNORMAL LOW (ref 28–66)
Intestinal Isoenzymes: 2 % (ref 1–24)
Liver Isoenzymes: 71 % — ABNORMAL HIGH (ref 25–69)

## 2017-12-23 LAB — TEST AUTHORIZATION

## 2017-12-23 LAB — LIPID PANEL
Cholesterol: 127 mg/dL (ref <200)
HDL: 47 mg/dL — ABNORMAL LOW (ref >50)
LDL Cholesterol (Calc): 64 mg/dL (calc)
Non-HDL Cholesterol (Calc): 80 mg/dL (calc) (ref <130)
Total CHOL/HDL Ratio: 2.7 (calc) (ref <5.0)
Triglycerides: 79 mg/dL (ref <150)

## 2017-12-23 LAB — CBC WITH DIFFERENTIAL/PLATELET
Basophils Absolute: 18 cells/uL (ref 0–200)
Basophils Relative: 0.4 %
Eosinophils Absolute: 101 cells/uL (ref 15–500)
Eosinophils Relative: 2.2 %
HCT: 35.8 % (ref 35.0–45.0)
Hemoglobin: 11.6 g/dL — ABNORMAL LOW (ref 11.7–15.5)
Lymphs Abs: 1518 cells/uL (ref 850–3900)
MCH: 26.9 pg — ABNORMAL LOW (ref 27.0–33.0)
MCHC: 32.4 g/dL (ref 32.0–36.0)
MCV: 82.9 fL (ref 80.0–100.0)
MPV: 10.1 fL (ref 7.5–12.5)
Monocytes Relative: 11 %
Neutro Abs: 2456 cells/uL (ref 1500–7800)
Neutrophils Relative %: 53.4 %
Platelets: 379 10*3/uL (ref 140–400)
RBC: 4.32 10*6/uL (ref 3.80–5.10)
RDW: 13.8 % (ref 11.0–15.0)
Total Lymphocyte: 33 %
WBC mixed population: 506 cells/uL (ref 200–950)
WBC: 4.6 10*3/uL (ref 3.8–10.8)

## 2017-12-23 LAB — MAGNESIUM: Magnesium: 2 mg/dL (ref 1.5–2.5)

## 2017-12-23 LAB — COMPLETE METABOLIC PANEL WITH GFR
AG Ratio: 1.3 (calc) (ref 1.0–2.5)
ALT: 14 U/L (ref 6–29)
AST: 15 U/L (ref 10–35)
Albumin: 4.1 g/dL (ref 3.6–5.1)
Alkaline phosphatase (APISO): 141 U/L — ABNORMAL HIGH (ref 33–130)
BUN: 14 mg/dL (ref 7–25)
CO2: 29 mmol/L (ref 20–32)
Calcium: 9.7 mg/dL (ref 8.6–10.4)
Chloride: 106 mmol/L (ref 98–110)
Creat: 0.86 mg/dL (ref 0.50–0.99)
GFR, Est African American: 84 mL/min/{1.73_m2} (ref >=60)
GFR, Est Non African American: 72 mL/min/{1.73_m2} (ref >=60)
Globulin: 3.1 g/dL (calc) (ref 1.9–3.7)
Glucose, Bld: 110 mg/dL — ABNORMAL HIGH (ref 65–99)
Potassium: 4.4 mmol/L (ref 3.5–5.3)
Sodium: 143 mmol/L (ref 135–146)
Total Bilirubin: 0.3 mg/dL (ref 0.2–1.2)
Total Protein: 7.2 g/dL (ref 6.1–8.1)

## 2017-12-23 LAB — HEMOGLOBIN A1C
Hgb A1c MFr Bld: 6.4 % of total Hgb — ABNORMAL HIGH (ref <5.7)
Mean Plasma Glucose: 137 (calc)
eAG (mmol/L): 7.6 (calc)

## 2017-12-23 LAB — GAMMA GT: GGT: 18 U/L (ref 3–65)

## 2017-12-24 ENCOUNTER — Telehealth: Payer: Self-pay

## 2017-12-24 DIAGNOSIS — R748 Abnormal levels of other serum enzymes: Secondary | ICD-10-CM

## 2017-12-24 NOTE — Telephone Encounter (Signed)
Referral enetered

## 2018-01-01 ENCOUNTER — Telehealth: Payer: Self-pay | Admitting: Gastroenterology

## 2018-01-01 NOTE — Telephone Encounter (Signed)
LVM for patient to call and reschedule an appt with Dr. Bonna Gains on 9:19

## 2018-01-06 ENCOUNTER — Ambulatory Visit (INDEPENDENT_AMBULATORY_CARE_PROVIDER_SITE_OTHER): Payer: 59 | Admitting: Gastroenterology

## 2018-01-06 ENCOUNTER — Encounter: Payer: Self-pay | Admitting: Gastroenterology

## 2018-01-06 VITALS — BP 123/80 | HR 75 | Ht 66.0 in | Wt 206.8 lb

## 2018-01-06 DIAGNOSIS — R748 Abnormal levels of other serum enzymes: Secondary | ICD-10-CM

## 2018-01-06 NOTE — Progress Notes (Signed)
[name] [name]  [name], [name] [address]  Main: [phone]  Fax: [phone]   Gastroenterology Consultation  Referring Provider:     Arnetha Courser, MD Primary Care Physician:  Arnetha Courser, MD Primary Gastroenterologist:  Dr. Vonda Carr Reason for Consultation:     Elevated alk phos        HPI:    Chief Complaint  Patient presents with  . Jean Patient (Initial Visit)    [name] is a 62 y.o. y/o female referred for consultation & management  by Dr. Sanda Klein, Satira Anis, MD.  Patient found to have mildly elevated alk phos chronically on lab work done by primary care provider.  Most recent lab work on August 1 showed alk phos of 141.  Total bilirubin normal, transaminases normal.  No signs of cirrhosis.  No heavy alcohol use or hepatotoxic drugs.  No abdominal pain, nausea vomiting, weight loss, altered bowel habits or blood in stool.  Patient's GGT was normal which would go against hepatic cause for elevation of alk phos, however, alk phos isoenzymes showed liver enzymes to be 71% which is slightly elevated, and bone enzymes to be 27% which is low.  Past Medical History:  Diagnosis Date  . Anemia   . Benign hypertensive renal disease   . DDD (degenerative disc disease), lumbar   . Elevated alkaline phosphatase level   . Fibrocystic disease of both breasts   . GERD (gastroesophageal reflux disease)   . Hyperlipidemia   . Hypertension   . Impaired fasting glucose   . Insomnia   . Menopausal state   . Migraine   . Osteoarthritis   . 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 Perforation of nasal septum   . Pre-diabetes   . Recurrent major depression ([place])   . Sleep apnea    Does not use CPAP  . Spondylosis   . Thrombocytosis ([place]) 10/01/2015   mild  . Wears dentures    full upper    Past Surgical History:  Procedure Laterality Date  . ABDOMINAL HYSTERECTOMY     partial with bladder suspension, no cervix  .  CHOLECYSTECTOMY  2003  . COLONOSCOPY  2008   1 polyp- return 10 years  . COLONOSCOPY WITH PROPOFOL N/A 06/30/2017   Procedure: COLONOSCOPY WITH PROPOFOL;  Surgeon: Lucilla Lame, MD;  Location: [facility];  Service: Endoscopy;  Laterality: N/A;  slep apnea  . KNEE SURGERY Right 2014  . PITUITARY SURGERY     x2 30+ years ago  . transnasal approach pituitary adenoma resection  2015   clival/parasellar mass    Prior to Admission medications   Medication Sig Start Date End Date Taking? Authorizing Provider  amLODipine (NORVASC) 10 MG tablet TAKE 1 TABLET (10 MG TOTAL) BY MOUTH DAILY. FOR BLOOD PRESSURE 08/18/17  Yes Lada, Satira Anis, MD  atorvastatin (LIPITOR) 20 MG tablet Take 1 tablet (20 mg total) by mouth at bedtime. 11/11/17  Yes Gollan, Kathlene November, MD  fluticasone (FLONASE) 50 MCG/ACT nasal spray Place 2 sprays into both nostrils daily. Patient taking differently: Place 2 sprays into both nostrils as needed.  02/19/16  Yes Lada, Satira Anis, MD  furosemide (LASIX) 20 MG tablet Take 1 tablet (20 mg total) by mouth daily as needed. 11/11/17  Yes Minna Merritts, MD  losartan (COZAAR) 100 MG tablet Take 1 tablet (100 mg total) by mouth daily. 08/01/17  Yes Lada, Satira Anis, MD  metFORMIN (  GLUCOPHAGE) 500 MG tablet Take 500 mg by mouth every other day.  12/24/14  Yes [provider]  polyethylene glycol powder (GLYCOLAX/MIRALAX) powder TAKE 255 G BY MOUTH ONCE. 03/12/17  Yes [provider]  traZODone (DESYREL) 50 MG tablet Take 0.5-1 tablets (25-50 mg total) by mouth at bedtime as needed for sleep. 12/11/17  Yes Lada, Melinda P, MD    Family History  Problem Relation Age of Onset  . Diabetes Mother   . Hypertension Mother   . Cancer Mother        possible liver  . Heart disease Father   . Hypertension Father   . Heart attack Father   . Alzheimer's disease Father   . Diabetes Sister   . Hypertension Sister   . Cancer Sister        lung?  . Stroke Sister   . Atrial  fibrillation Sister   . Breast cancer Maternal Grandmother   . Cancer Sister   . Lymphoma Sister      Social History   Tobacco Use  . Smoking status: Passive Smoke Exposure - Never Smoker  . Smokeless tobacco: Never Used  Substance Use Topics  . Alcohol use: No    Comment: rare (1-2 drinks/yr)  . Drug use: No    Allergies as of 01/06/2018 - Review Complete 01/06/2018  Allergen Reaction Noted  . Lisinopril Cough 08/22/2014  . Shrimp [shellfish allergy] Swelling 06/24/2017  . Sulfa antibiotics Swelling 08/22/2014    Review of Systems:    All systems reviewed and negative except where noted in HPI.   Physical Exam:  BP 123/80   Pulse 75   Ht 5' 6" (1.676 m)   Wt 206 lb 12.8 oz (93.8 kg)   BMI 33.38 kg/m  No LMP recorded. Patient has had a hysterectomy. Psych:  Alert and cooperative. Normal mood and affect. General:   Alert,  Well-developed, well-nourished, pleasant and cooperative in NAD Head:  Normocephalic and atraumatic. Eyes:  Sclera clear, no icterus.   Conjunctiva pink. Ears:  Normal auditory acuity. Nose:  No deformity, discharge, or lesions. Mouth:  No deformity or lesions,oropharynx pink & moist. Neck:  Supple; no masses or thyromegaly. Lungs:  Respirations even and unlabored.  Clear throughout to auscultation.   No wheezes, crackles, or rhonchi. No acute distress. Heart:  Regular rate and rhythm; no murmurs, clicks, rubs, or gallops. Abdomen:  Normal bowel sounds.  No bruits.  Soft, non-tender and non-distended without masses, hepatosplenomegaly or hernias noted.  No guarding or rebound tenderness.    Msk:  Symmetrical without gross deformities. Good, equal movement & strength bilaterally. Pulses:  Normal pulses noted. Extremities:  No clubbing or edema.  No cyanosis. Neurologic:  Alert and oriented x3;  grossly normal neurologically. Skin:  Intact without significant lesions or rashes. No jaundice. Lymph Nodes:  No significant cervical adenopathy. Psych:   Alert and cooperative. Normal mood and affect.   Labs: CBC    Component Value Date/Time   WBC 4.6 12/11/2017 0859   RBC 4.32 12/11/2017 0859   HGB 11.6 (L) 12/11/2017 0859   HGB 11.7 02/06/2016 0852   HCT 35.8 12/11/2017 0859   HCT 36.6 02/06/2016 0852   PLT 379 12/11/2017 0859   PLT 493 (H) 02/06/2016 0852   MCV 82.9 12/11/2017 0859   MCV 82 02/06/2016 0852   MCH 26.9 (L) 12/11/2017 0859   MCHC 32.4 12/11/2017 0859   RDW 13.8 12/11/2017 0859   RDW 15.0 02/06/2016 0852   LYMPHSABS   1,518 12/11/2017 0859   LYMPHSABS 1.4 02/06/2016 0852   EOSABS 101 12/11/2017 0859   EOSABS 0.1 02/06/2016 0852   BASOSABS 18 12/11/2017 0859   BASOSABS 0.0 02/06/2016 0852   CMP     Component Value Date/Time   NA 143 12/11/2017 0859   NA 143 02/06/2016 0852   K 4.4 12/11/2017 0859   CL 106 12/11/2017 0859   CO2 29 12/11/2017 0859   GLUCOSE 110 (H) 12/11/2017 0859   BUN 14 12/11/2017 0859   BUN 11 02/06/2016 0852   CREATININE 0.86 12/11/2017 0859   CALCIUM 9.7 12/11/2017 0859   PROT 7.2 12/11/2017 0859   PROT 7.2 02/06/2016 0852   ALBUMIN 4.1 02/06/2016 0852   AST 15 12/11/2017 0859   ALT 14 12/11/2017 0859   ALKPHOS 115 02/06/2016 0852   BILITOT 0.3 12/11/2017 0859   BILITOT <0.2 02/06/2016 0852   GFRNONAA 72 12/11/2017 0859   GFRAA 84 12/11/2017 0859    Imaging Studies: No results found.  Assessment and Plan:   SIMMIE GARIN is a 62 y.o. y/o female has been referred for mildly elevated alk phos  We will complete liver work-up with viral and autoimmune hepatitis testing The normal GGT, but slightly elevated liver isoenzymes on alk phos isoenzymes testing is likely due to only borderline elevated alk phos, and thus a confusing picture of fractionation. Patient denies any history of osteoporosis, recent bone infections  We will obtain right upper quadrant ultrasound Patient asked to avoid hepatotoxic drugs like she has been No alarm symptoms present  Screening colonoscopy  up-to-date from 08/2017  Dr [name]

## 2018-01-06 NOTE — Patient Instructions (Addendum)
We will call you when your lab results are in and the doctor has reviewed them. Bed wedge F/U 6 months or sooner if needed.  Ultrasound of right upper quad scheduled for 01/09/2018 at [facility] [address].     Arrival time: 7:45 am     Ultrasound: 8:00 am Nothing to eat or drink after midnight the night prior to your ultrasound.  If you need to reschedule, call [phone] for Central Scheduling.  Gastroesophageal Reflux Disease, Adult Normally, food travels down the esophagus and stays in the stomach to be digested. If a person has gastroesophageal reflux disease (GERD), food and stomach acid move back up into the esophagus. When this happens, the esophagus becomes sore and swollen (inflamed). Over time, GERD can make small holes (ulcers) in the lining of the esophagus. Follow these instructions at home: Diet  Follow a diet as told by your doctor. You may need to avoid foods and drinks such as: ? Coffee and tea (with or without caffeine). ? Drinks that contain alcohol. ? Energy drinks and sports drinks. ? Carbonated drinks or sodas. ? Chocolate and cocoa. ? Peppermint and mint flavorings. ? Garlic and onions. ? Horseradish. ? Spicy and acidic foods, such as peppers, chili powder, curry powder, vinegar, hot sauces, and BBQ sauce. ? Citrus fruit juices and citrus fruits, such as oranges, lemons, and limes. ? Tomato-based foods, such as red sauce, chili, salsa, and pizza with red sauce. ? Fried and fatty foods, such as donuts, french fries, potato chips, and high-fat dressings. ? High-fat meats, such as hot dogs, rib eye steak, sausage, ham, and bacon. ? High-fat dairy items, such as whole milk, butter, and cream cheese.  Eat small meals often. Avoid eating large meals.  Avoid drinking large amounts of liquid with your meals.  Avoid eating meals during the 2-3 hours before bedtime.  Avoid lying down right after you eat.  Do not exercise right  after you eat. General instructions  Pay attention to any changes in your symptoms.  Take over-the-counter and prescription medicines only as told by your doctor. Do not take aspirin, ibuprofen, or other NSAIDs unless your doctor says it is okay.  Do not use any tobacco products, including cigarettes, chewing tobacco, and e-cigarettes. If you need help quitting, ask your doctor.  Wear loose clothes. Do not wear anything tight around your waist.  Raise (elevate) the head of your bed about 6 inches (15 cm).  Try to lower your stress. If you need help doing this, ask your doctor.  If you are overweight, lose an amount of weight that is healthy for you. Ask your doctor about a safe weight loss goal.  Keep all follow-up visits as told by your doctor. This is important. Contact a doctor if:  You have new symptoms.  You lose weight and you do not know why it is happening.  You have trouble swallowing, or it hurts to swallow.  You have wheezing or a cough that keeps happening.  Your symptoms do not get better with treatment.  You have a hoarse voice. Get help right away if:  You have pain in your arms, neck, jaw, teeth, or back.  You feel sweaty, dizzy, or light-headed.  You have chest pain or shortness of breath.  You throw up (vomit) and your throw up looks like blood or coffee grounds.  You pass out (faint).  Your poop (stool) is bloody or black.  You cannot swallow, drink, or eat. This  information is not intended to replace advice given to you by your health care provider. Make sure you discuss any questions you have with your health care provider. Document Released: 10/16/2007 Document Revised: 10/05/2015 Document Reviewed: 08/24/2014 Elsevier Interactive Patient Education  [employer].

## 2018-01-09 ENCOUNTER — Ambulatory Visit
Admission: RE | Admit: 2018-01-09 | Discharge: 2018-01-09 | Disposition: A | Discharge status: Home / Self Care | Payer: 59 | Source: Ambulatory Visit | Attending: Gastroenterology | Admitting: Gastroenterology

## 2018-01-09 DIAGNOSIS — Z9049 Acquired absence of other specified parts of digestive tract: Secondary | ICD-10-CM | POA: Insufficient documentation

## 2018-01-09 DIAGNOSIS — R748 Abnormal levels of other serum enzymes: Secondary | ICD-10-CM | POA: Diagnosis not present

## 2018-01-14 LAB — CERULOPLASMIN: Ceruloplasmin: 30.1 mg/dL (ref 19.0–39.0)

## 2018-01-14 LAB — HEPATITIS C ANTIBODY (REFLEX): HCV Ab: <0.1 s/co ratio (ref 0.0–0.9)

## 2018-01-14 LAB — HEPATITIS B SURFACE ANTIGEN: Hepatitis B Surface Ag: NEGATIVE

## 2018-01-14 LAB — HCV COMMENT:

## 2018-01-14 LAB — HEPATITIS B SURFACE ANTIBODY,QUALITATIVE: Hep B Surface Ab, Qual: NONREACTIVE

## 2018-01-14 LAB — HEPATITIS A ANTIBODY, TOTAL: Hep A Total Ab: NEGATIVE

## 2018-01-14 LAB — HEPATITIS B CORE ANTIBODY, TOTAL: Hep B Core Total Ab: NEGATIVE

## 2018-01-14 LAB — ANTI-MICROSOMAL ANTIBODY LIVER / KIDNEY: LKM1 Ab: 0.7 Units (ref 0.0–20.0)

## 2018-01-14 LAB — IGG 4: IgG, Subclass 4: 70 mg/dL (ref 2–96)

## 2018-01-14 LAB — MITOCHONDRIAL/SMOOTH MUSCLE AB PNL
Mitochondrial Ab: <20 Units (ref 0.0–20.0)
Smooth Muscle Ab: 14 Units (ref 0–19)

## 2018-01-14 LAB — FERRITIN: Ferritin: 49 ng/mL (ref 15–150)

## 2018-01-29 ENCOUNTER — Ambulatory Visit: Payer: 59 | Admitting: Gastroenterology

## 2018-02-06 ENCOUNTER — Other Ambulatory Visit: Payer: Self-pay | Admitting: Cardiovascular Disease

## 2018-04-08 ENCOUNTER — Encounter: Payer: Self-pay | Admitting: Gastroenterology

## 2018-04-08 ENCOUNTER — Ambulatory Visit (INDEPENDENT_AMBULATORY_CARE_PROVIDER_SITE_OTHER): Payer: 59 | Admitting: Gastroenterology

## 2018-04-08 VITALS — BP 161/93 | HR 60 | Ht 66.0 in | Wt 212.8 lb

## 2018-04-08 DIAGNOSIS — R140 Abdominal distension (gaseous): Secondary | ICD-10-CM | POA: Diagnosis not present

## 2018-04-08 DIAGNOSIS — R748 Abnormal levels of other serum enzymes: Secondary | ICD-10-CM | POA: Diagnosis not present

## 2018-04-08 NOTE — Patient Instructions (Signed)
High-Fiber Diet  Fiber, also called dietary fiber, is a type of carbohydrate found in fruits, vegetables, whole grains, and beans. A high-fiber diet can have many health benefits. Your health care provider may recommend a high-fiber diet to help:  · Prevent constipation. Fiber can make your bowel movements more regular.  · Lower your cholesterol.  · Relieve hemorrhoids, uncomplicated diverticulosis, or irritable bowel syndrome.  · Prevent overeating as part of a weight-loss plan.  · Prevent heart disease, type 2 diabetes, and certain cancers.    What is my plan?  The recommended daily intake of fiber includes:  · 38 grams for men under age 50.  · 30 grams for men over age 50.  · 25 grams for women under age 50.  · 21 grams for women over age 50.    You can get the recommended daily intake of dietary fiber by eating a variety of fruits, vegetables, grains, and beans. Your health care provider may also recommend a fiber supplement if it is not possible to get enough fiber through your diet.  What do I need to know about a high-fiber diet?  · Fiber supplements have not been widely studied for their effectiveness, so it is better to get fiber through food sources.  · Always check the fiber content on the nutrition facts label of any prepackaged food. Look for foods that contain at least 5 grams of fiber per serving.  · Ask your dietitian if you have questions about specific foods that are related to your condition, especially if those foods are not listed in the following section.  · Increase your daily fiber consumption gradually. Increasing your intake of dietary fiber too quickly may cause bloating, cramping, or gas.  · Drink plenty of water. Water helps you to digest fiber.  What foods can I eat?  Grains  Whole-grain breads. Multigrain cereal. Oats and oatmeal. Brown rice. Barley. Bulgur wheat. Millet. Bran muffins. Popcorn. Rye wafer crackers.  Vegetables   Sweet potatoes. Spinach. Kale. Artichokes. Cabbage. Broccoli. Green peas. Carrots. Squash.  Fruits  Berries. Pears. Apples. Oranges. Avocados. Prunes and raisins. Dried figs.  Meats and Other Protein Sources  Navy, kidney, pinto, and soy beans. Split peas. Lentils. Nuts and seeds.  Dairy  Fiber-fortified yogurt.  Beverages  Fiber-fortified soy milk. Fiber-fortified orange juice.  Other  Fiber bars.  The items listed above may not be a complete list of recommended foods or beverages. Contact your dietitian for more options.  What foods are not recommended?  Grains  White bread. Pasta made with refined flour. White rice.  Vegetables  Fried potatoes. Canned vegetables. Well-cooked vegetables.  Fruits  Fruit juice. Cooked, strained fruit.  Meats and Other Protein Sources  Fatty cuts of meat. Fried poultry or fried fish.  Dairy  Milk. Yogurt. Cream cheese. Sour cream.  Beverages  Soft drinks.  Other  Cakes and pastries. Butter and oils.  The items listed above may not be a complete list of foods and beverages to avoid. Contact your dietitian for more information.  What are some tips for including high-fiber foods in my diet?  · Eat a wide variety of high-fiber foods.  · Make sure that half of all grains consumed each day are whole grains.  · Replace breads and cereals made from refined flour or white flour with whole-grain breads and cereals.  · Replace white rice with brown rice, bulgur wheat, or millet.  · Start the day with a breakfast that is high in fiber,   such as a cereal that contains at least 5 grams of fiber per serving.  · Use beans in place of meat in soups, salads, or pasta.  · Eat high-fiber snacks, such as berries, raw vegetables, nuts, or popcorn.  This information is not intended to replace advice given to you by your health care provider. Make sure you discuss any questions you have with your health care provider.  Document Released: 04/29/2005 Document Revised: 10/05/2015 Document Reviewed: 10/12/2013   Elsevier Interactive Patient Education © 2018 Elsevier Inc.

## 2018-04-08 NOTE — Progress Notes (Signed)
Vonda Antigua, MD [address]  [place]  [place], [place] [address]  Main: [phone]  Fax: [phone]   Primary Care Physician: Arnetha Courser, MD  Primary Gastroenterologist:  Dr. Vonda Antigua  Chief Complaint  Patient presents with  . Follow-up    elevated liver enzymes    HPI: [name] is a 62 y.o. female here for follow up of elevated alkaline phosphatase.  Patient denies any right upper quadrant pain, no jaundice, no changes in color of urine or stool.  No pruritus.  No nausea or vomiting.  Patient reports intermittent epigastric bloating, no abdominal pain associated with this.  No dysphagia or heartburn.  Used to be on acid reducers in the past but ran out of medications and heartburn has not reoccurred.  Denies any previous history of EGD.  No melena or hematochezia.  Reports constipation and does not have a bowel movement every day.  States has stool softeners at home but does not take them regularly.  Does not eat a high-fiber diet.  No weight loss.  Has chronically elevated alkaline phosphatase, with August 2019 blood work showing alk phos of 146, with a proliferative, 130.  December 2018 alk phos of 132.  Normal transaminases, bilirubin and albumin.  Normal platelets.  Normal GGT December 11, 2017.  Other liver blood work, including autoimmune testing, antimicrosomal, antimitochondrial, anti-smooth muscle antibodies negative.  Hepatitis B C and a serology negative.  IgG4 and ceruloplasmin normal.  Alk phos fractionation showed elevated liver isoenzymes to 71%.    Current Outpatient Medications  Medication Sig Dispense Refill  . amLODipine (NORVASC) 10 MG tablet TAKE 1 TABLET (10 MG TOTAL) BY MOUTH DAILY. FOR BLOOD PRESSURE 30 tablet 11  . atorvastatin (LIPITOR) 20 MG tablet Take 1 tablet (20 mg total) by mouth at bedtime. 90 tablet 3  . fluticasone (FLONASE) 50 MCG/ACT nasal spray Place 2 sprays into both nostrils daily. (Patient taking  differently: Place 2 sprays into both nostrils as needed. ) 16 g 6  . furosemide (LASIX) 20 MG tablet TAKE 1 TABLET BY MOUTH EVERY DAY AS NEEDED 90 tablet 0  . losartan (COZAAR) 100 MG tablet Take 1 tablet (100 mg total) by mouth daily. 90 tablet 3  . metFORMIN (GLUCOPHAGE) 500 MG tablet Take 500 mg by mouth every other day.   3  . polyethylene glycol powder (GLYCOLAX/MIRALAX) powder TAKE 255 G BY MOUTH ONCE.  0  . traZODone (DESYREL) 50 MG tablet Take 0.5-1 tablets (25-50 mg total) by mouth at bedtime as needed for sleep.     No current facility-administered medications for this visit.     Allergies as of 04/08/2018 - Review Complete 04/08/2018  Allergen Reaction Noted  . Lisinopril Cough 08/22/2014  . Shrimp [shellfish allergy] Swelling 06/24/2017  . Sulfa antibiotics Swelling 08/22/2014    ROS:  General: Negative for anorexia, weight loss, fever, chills, fatigue, weakness. ENT: Negative for hoarseness, difficulty swallowing , nasal congestion. CV: Negative for chest pain, angina, palpitations, dyspnea on exertion, peripheral edema.  Respiratory: Negative for dyspnea at rest, dyspnea on exertion, cough, sputum, wheezing.  GI: See history of present illness. GU:  Negative for dysuria, hematuria, urinary incontinence, urinary frequency, nocturnal urination.  Endo: Negative for unusual weight change.    Physical Examination:   BP (!) 161/93 Comment: pt has not taken B/P medication today  Pulse 60   Ht '5\' 6"'$  (1.676 m)   Wt 212 lb 12.8 oz (96.5 kg)   BMI 34.35  kg/m   General: Well-nourished, well-developed in no acute distress.  Eyes: No icterus. Conjunctivae pink. Mouth: Oropharyngeal mucosa moist and pink , no lesions erythema or exudate. Neck: Supple, Trachea midline Abdomen: Bowel sounds are normal, nontender, nondistended, no hepatosplenomegaly or masses, no abdominal bruits or hernia , no rebound or guarding.   Extremities: No lower extremity edema. No clubbing or  deformities. Neuro: Alert and oriented x 3.  Grossly intact. Skin: Warm and dry, no jaundice.   Psych: Alert and cooperative, normal mood and affect.   Labs: CMP     Component Value Date/Time   NA 143 12/11/2017 0859   NA 143 02/06/2016 0852   K 4.4 12/11/2017 0859   CL 106 12/11/2017 0859   CO2 29 12/11/2017 0859   GLUCOSE 110 (H) 12/11/2017 0859   BUN 14 12/11/2017 0859   BUN 11 02/06/2016 0852   CREATININE 0.86 12/11/2017 0859   CALCIUM 9.7 12/11/2017 0859   PROT 7.2 12/11/2017 0859   PROT 7.2 02/06/2016 0852   ALBUMIN 4.1 02/06/2016 0852   AST 15 12/11/2017 0859   ALT 14 12/11/2017 0859   ALKPHOS 115 02/06/2016 0852   BILITOT 0.3 12/11/2017 0859   BILITOT <0.2 02/06/2016 0852   GFRNONAA 72 12/11/2017 0859   GFRAA 84 12/11/2017 0859   Lab Results  Component Value Date   WBC 4.6 12/11/2017   HGB 11.6 (L) 12/11/2017   HCT 35.8 12/11/2017   MCV 82.9 12/11/2017   PLT 379 12/11/2017    Imaging Studies: No results found.  Assessment and Plan:   [name] is a 62 y.o. y/o female chronically mildly elevated alk phos, with normal GGT  No clinical evidence of liver dysfunction Mildly elevated alk phos is not likely to be due to liver due to normal GGT Alk phos fractionation showing elevated liver isoenzymes to 71% is likely due to only borderline elevation of the alk phos itself.  She does not have any pruritus or evidence of seronegative PBC We will continue to monitor alk phos on future clinic visits  Finding of fatty liver on imaging discussed with patient Diet, weight loss, and exercise encouraged along with avoiding hepatotoxic drugs including alcohol Risk of progression to cirrhosis if above measures are not instituted were discussed as well, and patient verbalized understanding  Patient was advised to get hepatitis A and B vaccination due to her fatty liver and she states she will think about it and does not want it today.  He was also asked to have  her other immunizations up-to-date such as flu shot and discussed this with her primary care provider she has not done it already.  She reports intermittent bloating Will check H. pylori breath test Bloating may also be due to underlying constipation High-fiber diet MiraLAX daily with goal of 1-2 soft bowel movements daily.  If not at goal, patient instructed to increase dose to twice daily.  If loose stools with the medication, patient asked to decrease the medication to every other day, or half dose daily.  Patient verbalized understanding  Screening colonoscopy up-to-date, April 2019    Dr Vonda Antigua

## 2018-04-14 ENCOUNTER — Ambulatory Visit (INDEPENDENT_AMBULATORY_CARE_PROVIDER_SITE_OTHER): Payer: 59 | Admitting: Gastroenterology

## 2018-04-14 DIAGNOSIS — Z23 Encounter for immunization: Secondary | ICD-10-CM | POA: Diagnosis not present

## 2018-04-14 DIAGNOSIS — R748 Abnormal levels of other serum enzymes: Secondary | ICD-10-CM

## 2018-04-15 ENCOUNTER — Telehealth: Payer: Self-pay | Admitting: Gastroenterology

## 2018-04-15 NOTE — Telephone Encounter (Signed)
-----   Message from Martie Lee, LPN sent at 21/01/7587 11:56 AM EST ----- Regarding: appt. Please schedule pt's 2nd Twinrix injection (HEP A/B) for 1 month out. About 05/15/2018 or so.  I forgot to tell her. After her next (2nd) injection she will need her last one 6 months after that. Thank you!

## 2018-04-15 NOTE — Telephone Encounter (Signed)
Left vm for pt to call office and schedule 2nd injection for 1 month out

## 2018-04-16 ENCOUNTER — Telehealth: Payer: Self-pay

## 2018-04-16 ENCOUNTER — Telehealth: Payer: Self-pay | Admitting: Gastroenterology

## 2018-04-16 LAB — H. PYLORI BREATH TEST: H pylori Breath Test: NEGATIVE

## 2018-04-16 NOTE — Telephone Encounter (Signed)
Pt left vmail attempting to return a phone call from the nurse. Pls call patient.

## 2018-04-16 NOTE — Telephone Encounter (Signed)
Appointment scheduled for January 10th at [facility] for TwinRix Injection.  Thanks [employer]

## 2018-04-17 ENCOUNTER — Telehealth: Payer: Self-pay

## 2018-04-17 NOTE — Telephone Encounter (Signed)
Left message that test was negative/normal and to contact office if any questions.

## 2018-04-17 NOTE — Telephone Encounter (Signed)
-----   Message from Virgel Manifold, MD sent at 04/16/2018  4:15 PM EST ----- [name] please let patient know, her H. pylori breath test was normal

## 2018-05-04 ENCOUNTER — Other Ambulatory Visit: Payer: Self-pay | Admitting: Cardiovascular Disease

## 2018-05-04 NOTE — Telephone Encounter (Signed)
Filled with note saying "ease contact PCP for further refills. Dr Rockey Situ said f/u on as needed basis."

## 2018-05-04 NOTE — Telephone Encounter (Signed)
Spoke w/ Pt she mentioned she is taking furosemide 20 mg tablet prn for swelling/BP. Pt doesn't have Furosemide 20 mg tablet on medlist @ last OV note or what dose pt was to take. Please advise if ok to refill.

## 2018-05-22 ENCOUNTER — Ambulatory Visit: Payer: 59 | Admitting: Gastroenterology

## 2018-05-22 DIAGNOSIS — R748 Abnormal levels of other serum enzymes: Secondary | ICD-10-CM

## 2018-05-22 DIAGNOSIS — Z9229 Personal history of other drug therapy: Secondary | ICD-10-CM

## 2018-05-27 ENCOUNTER — Ambulatory Visit (INDEPENDENT_AMBULATORY_CARE_PROVIDER_SITE_OTHER): Payer: 59 | Admitting: Nurse Practitioner

## 2018-05-27 ENCOUNTER — Encounter: Payer: Self-pay | Admitting: Nurse Practitioner

## 2018-05-27 VITALS — BP 128/72 | HR 97 | Temp 98.9°F | Resp 16 | Ht 66.0 in | Wt 210.1 lb

## 2018-05-27 DIAGNOSIS — R059 Cough, unspecified: Secondary | ICD-10-CM

## 2018-05-27 DIAGNOSIS — J0100 Acute maxillary sinusitis, unspecified: Secondary | ICD-10-CM | POA: Diagnosis not present

## 2018-05-27 DIAGNOSIS — R05 Cough: Secondary | ICD-10-CM | POA: Diagnosis not present

## 2018-05-27 MED ORDER — AMOXICILLIN-POT CLAVULANATE 875-125 MG PO TABS: 1.0000 | ORAL_TABLET | Freq: Two times a day (BID) | ORAL | 0 refills | Status: DC

## 2018-05-27 MED ORDER — LORATADINE 10 MG PO TABS: 10.0000 mg | ORAL_TABLET | Freq: Every day | ORAL | 0 refills | Status: DC

## 2018-05-27 MED ORDER — PROMETHAZINE-DM 6.25-15 MG/5ML PO SYRP: 2.5000 mL | ORAL_SOLUTION | Freq: Four times a day (QID) | ORAL | 0 refills | Status: DC | PRN

## 2018-05-27 MED ORDER — FLUTICASONE PROPIONATE 50 MCG/ACT NA SUSP: 2.0000 | NASAL | 0 refills | Status: DC | PRN

## 2018-05-27 NOTE — Progress Notes (Signed)
Name: [name]   MRN: [id number]    DOB: 1955/07/13   Date:05/27/2018       Progress Note  Subjective  Chief Complaint  Chief Complaint  Patient presents with  . Cough    thick whitish yellow sputum. OTC coricidan   . Nasal Congestion    sx started on Saturday  . Ear Fullness  . Sore Throat    moreso scratchy    HPI  Patient states last week was having facial pressure and ear fullness. Progressed to worsening congestion and coughing up thick yellow sputum. States getting copious amounts of nasal congestion- states making her nauseated. States 2 days ago had gotten better and worsened yesterday. States today notes scratchy throat. Notes intermittent chills Monday.   OTC coricidin products. Denies shortness of breath or chest pain.    Patient Active Problem List   Diagnosis Date Noted  . Hiatal hernia 11/10/2017  . Snores 07/25/2017  . Somnolence, daytime 07/25/2017  . Special screening for malignant neoplasms, colon   . Hypertension 06/13/2017  . Low back pain 02/19/2016  . Dysfunction of left eustachian tube 10/01/2015  . Vitamin D deficiency 10/01/2015  . Medication monitoring encounter 10/01/2015  . Thrombocytosis ([place]) 10/01/2015  . Alkaline phosphatase elevation 10/01/2015  . Prediabetes 09/27/2015  . Perforated nasal septum 08/25/2015  . Preventative health care 02/21/2015  . Obesity 02/21/2015  . Breast cancer screening 02/16/2015  . GERD (gastroesophageal reflux disease)   . Osteoarthritis   . Benign hypertensive renal disease   . Hyperlipidemia   . Sleep apnea   . DDD (degenerative disc disease), lumbar   . Insomnia   . Impaired fasting glucose   . Recurrent major depression ([place])   . Migraine   . Pituitary adenoma ([place]) 11/12/2013  . 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 Anemia   . Benign hypertensive renal disease   . DDD (degenerative disc disease), lumbar   . Elevated alkaline phosphatase level   .  Fibrocystic disease of both breasts   . GERD (gastroesophageal reflux disease)   . Hyperlipidemia   . Hypertension   . Impaired fasting glucose   . Insomnia   . Menopausal state   . Migraine   . Osteoarthritis   . 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 Perforation of nasal septum   . Pre-diabetes   . Recurrent major depression ([place])   . Sleep apnea    Does not use CPAP  . Spondylosis   . Thrombocytosis ([place]) 10/01/2015   mild  . Wears dentures    full upper    Past Surgical History:  Procedure Laterality Date  . ABDOMINAL HYSTERECTOMY     partial with bladder suspension, no cervix  . CHOLECYSTECTOMY  2003  . COLONOSCOPY  2008   1 polyp- return 10 years  . COLONOSCOPY WITH PROPOFOL N/A 06/30/2017   Procedure: COLONOSCOPY WITH PROPOFOL;  Surgeon: Lucilla Lame, MD;  Location: [facility];  Service: Endoscopy;  Laterality: N/A;  slep apnea  . KNEE SURGERY Right 2014  . PITUITARY SURGERY     x2 30+ years ago  . transnasal approach pituitary adenoma resection  2015   clival/parasellar mass    Social History   Tobacco Use  . Smoking status: Passive Smoke Exposure - Never Smoker  . Smokeless tobacco: Never Used  Substance Use Topics  . Alcohol use: No    Comment: rare (1-2 drinks/yr)     Current  Outpatient Medications:  .  amLODipine (NORVASC) 10 MG tablet, TAKE 1 TABLET (10 MG TOTAL) BY MOUTH DAILY. FOR BLOOD PRESSURE, Disp: 30 tablet, Rfl: 11 .  atorvastatin (LIPITOR) 20 MG tablet, Take 1 tablet (20 mg total) by mouth at bedtime., Disp: 90 tablet, Rfl: 3 .  fluticasone (FLONASE) 50 MCG/ACT nasal spray, Place 2 sprays into both nostrils as needed., Disp: 1 g, Rfl: 0 .  furosemide (LASIX) 20 MG tablet, Take 1 tablet (20 mg total) by mouth daily as needed. Please contact PCP for further refills. Dr Rockey Situ said f/u on as needed basis., Disp: 90 tablet, Rfl: 2 .  losartan (COZAAR) 100 MG tablet, Take 1 tablet (100 mg total) by mouth daily., Disp: 90 tablet, Rfl: 3 .   metFORMIN (GLUCOPHAGE) 500 MG tablet, Take 500 mg by mouth every other day. , Disp: , Rfl: 3 .  polyethylene glycol powder (GLYCOLAX/MIRALAX) powder, TAKE 255 G BY MOUTH ONCE., Disp: , Rfl: 0 .  amoxicillin-clavulanate (AUGMENTIN) 875-125 MG tablet, Take 1 tablet by mouth 2 (two) times daily., Disp: 20 tablet, Rfl: 0 .  loratadine (CLARITIN) 10 MG tablet, Take 1 tablet (10 mg total) by mouth daily., Disp: 30 tablet, Rfl: 0 .  promethazine-dextromethorphan (PROMETHAZINE-DM) 6.25-15 MG/5ML syrup, Take 2.5 mLs by mouth 4 (four) times daily as needed., Disp: 118 mL, Rfl: 0 .  traZODone (DESYREL) 50 MG tablet, Take 0.5-1 tablets (25-50 mg total) by mouth at bedtime as needed for sleep., Disp: , Rfl:   Allergies  Allergen Reactions  . Lisinopril Cough       . Shrimp [Shellfish Allergy] Swelling    throat swells (lobster also)  . Sulfa Antibiotics Swelling    ROS   No other specific complaints in a complete review of systems (except as listed in HPI above).  Objective  Vitals:   05/27/18 0924  BP: 128/72  Pulse: 97  Resp: 16  Temp: 98.9 F (37.2 C)  TempSrc: Oral  SpO2: 96%  Weight: 210 lb 1.6 oz (95.3 kg)  Height: 5\' 6"  (1.676 m)     Body mass index is 33.91 kg/m.  Nursing Note and Vital Signs reviewed.  Physical Exam HENT:     Head: Normocephalic and atraumatic.     Right Ear: Hearing, ear canal and external ear normal. A middle ear effusion is present.     Left Ear: Hearing, ear canal and external ear normal. A middle ear effusion is present.     Nose:     Right Sinus: Maxillary sinus tenderness present. No frontal sinus tenderness.     Left Sinus: Maxillary sinus tenderness present. No frontal sinus tenderness.     Mouth/Throat:     Mouth: Mucous membranes are moist.     Pharynx: Uvula midline. No oropharyngeal exudate or posterior oropharyngeal erythema.     Tonsils: Swelling: 0 on the right. 0 on the left.  Eyes:     General:        Right eye: No discharge.         Left eye: No discharge.     Conjunctiva/sclera: Conjunctivae normal.  Neck:     Musculoskeletal: Normal range of motion.  Cardiovascular:     Rate and Rhythm: Normal rate.  Pulmonary:     Effort: Pulmonary effort is normal.     Breath sounds: Normal breath sounds.  Lymphadenopathy:     Cervical: No cervical adenopathy.  Skin:    General: Skin is warm and dry.     Findings:  No rash.  Neurological:     Mental Status: She is alert.  Psychiatric:        Judgment: Judgment normal.      No results found for this or any previous visit (from the past 48 hour(s)).  Assessment & Plan  1. Acute non-recurrent maxillary sinusitis Discussed delayed antibiotic use- only if acute worsening or not improved in 4 days.  - fluticasone (FLONASE) 50 MCG/ACT nasal spray; Place 2 sprays into both nostrils as needed.  Dispense: 1 g; Refill: 0 - loratadine (CLARITIN) 10 MG tablet; Take 1 tablet (10 mg total) by mouth daily.  Dispense: 30 tablet; Refill: 0 - amoxicillin-clavulanate (AUGMENTIN) 875-125 MG tablet; Take 1 tablet by mouth 2 (two) times daily.  Dispense: 20 tablet; Refill: 0  2. Cough - promethazine-dextromethorphan (PROMETHAZINE-DM) 6.25-15 MG/5ML syrup; Take 2.5 mLs by mouth 4 (four) times daily as needed.  Dispense: 118 mL; Refill: 0

## 2018-05-27 NOTE — Patient Instructions (Signed)
Your body is so smart and strong that it will be fighting this illness off for you but it is important that you drink plenty of fluids, rest. Cover your nose/mouth when you cough or sneeze and wash your hands well and often. Here are some helpful things you can use or pick up over the counter from the pharmacy to help with your symptoms:   For Fever/Pain: Acetaminophen every 6 hours as needed (maximum of 3000mg  a day). If you are still uncomfortable you can add ibuprofen OR naproxen  For coughing: try dextromethorphan for a cough suppressant, and/or a cool mist humidifier, lozenges  For sore throat: saline gargles, honey herbal tea, lozenges, throat spray  To dry out your nose: try an antihistamine like loratadine (non-sedating) or diphenhydramine (sedating) or others To relieve a stuffy nose: try an oral decongestant  Like pseudoephedrine if you are under the age of 34 and do not have high blood pressure, neti pot To make blowing your nose easier: guaifenesin   - Take cough syrup as needed for cough, if you need something non-drowsy please let us know - Take plain musinex to help with chest congestion and make it easier to blow your nose - Take Claritin and flonase daily until congestion and facial pain is resolved - If your symptoms start to worsen in the next few days or do not improve in 4-5 days please start taking antibiotic- Augmentin twice a day for the complete 7 day course. Do take this medicine with food. I recommend taking a probiotic anytime you take an antibiotic to replenish good gut health.

## 2018-06-15 ENCOUNTER — Ambulatory Visit (INDEPENDENT_AMBULATORY_CARE_PROVIDER_SITE_OTHER): Payer: 59 | Admitting: Family Medicine

## 2018-06-15 ENCOUNTER — Encounter: Payer: Self-pay | Admitting: Family Medicine

## 2018-06-15 VITALS — BP 130/82 | HR 85 | Temp 98.4°F | Ht 66.0 in | Wt 212.3 lb

## 2018-06-15 DIAGNOSIS — Z5181 Encounter for therapeutic drug level monitoring: Secondary | ICD-10-CM

## 2018-06-15 DIAGNOSIS — F3341 Major depressive disorder, recurrent, in partial remission: Secondary | ICD-10-CM | POA: Diagnosis not present

## 2018-06-15 DIAGNOSIS — E559 Vitamin D deficiency, unspecified: Secondary | ICD-10-CM

## 2018-06-15 DIAGNOSIS — R7303 Prediabetes: Secondary | ICD-10-CM

## 2018-06-15 DIAGNOSIS — D473 Essential (hemorrhagic) thrombocythemia: Secondary | ICD-10-CM

## 2018-06-15 DIAGNOSIS — D75839 Thrombocytosis, unspecified: Secondary | ICD-10-CM

## 2018-06-15 DIAGNOSIS — I10 Essential (primary) hypertension: Secondary | ICD-10-CM

## 2018-06-15 DIAGNOSIS — Z1239 Encounter for other screening for malignant neoplasm of breast: Secondary | ICD-10-CM

## 2018-06-15 DIAGNOSIS — E782 Mixed hyperlipidemia: Secondary | ICD-10-CM

## 2018-06-15 DIAGNOSIS — R748 Abnormal levels of other serum enzymes: Secondary | ICD-10-CM

## 2018-06-15 DIAGNOSIS — G4700 Insomnia, unspecified: Secondary | ICD-10-CM

## 2018-06-15 DIAGNOSIS — Z6833 Body mass index (BMI) 33.0-33.9, adult: Secondary | ICD-10-CM

## 2018-06-15 DIAGNOSIS — E6609 Other obesity due to excess calories: Secondary | ICD-10-CM

## 2018-06-15 DIAGNOSIS — R5383 Other fatigue: Secondary | ICD-10-CM

## 2018-06-15 MED ORDER — ESCITALOPRAM OXALATE 10 MG PO TABS: 10.0000 mg | ORAL_TABLET | Freq: Every day | ORAL | 0 refills | Status: DC

## 2018-06-15 NOTE — Assessment & Plan Note (Signed)
I am here to help if/when interested; treating depression may help she thinks

## 2018-06-15 NOTE — Assessment & Plan Note (Signed)
Order mammogram °

## 2018-06-15 NOTE — Assessment & Plan Note (Signed)
Check level 

## 2018-06-15 NOTE — Assessment & Plan Note (Signed)
Treat depression; self care; take trazodone earlier in the night

## 2018-06-15 NOTE — Assessment & Plan Note (Signed)
Start antidepressant; encouraged self love and time for self; counseling encouraged

## 2018-06-15 NOTE — Assessment & Plan Note (Signed)
Check CBC 

## 2018-06-15 NOTE — Patient Instructions (Addendum)
Try to limit saturated fats in your diet (bologna, hot dogs, barbeque, cheeseburgers, hamburgers, steak, bacon, sausage, cheese, etc.) and get more fresh fruits, vegetables, and whole grains  Try to follow the DASH guidelines (DASH stands for Dietary Approaches to Stop Hypertension). Try to limit the sodium in your diet to no more than 1,500mg  of sodium per day. Certainly try to not exceed 2,000 mg per day at the very most. Do not add salt when cooking or at the table.  Check the sodium amount on labels when shopping, and choose items lower in sodium when given a choice. Avoid or limit foods that already contain a lot of sodium. Eat a diet rich in fruits and vegetables and whole grains, and try to lose weight if overweight or obese  Check out the information at familydoctor.org entitled "Nutrition for Weight Loss: What You Need to Know about Fad Diets" Try to lose between 1-2 pounds per week by taking in fewer calories and burning off more calories You can succeed by limiting portions, limiting foods dense in calories and fat, becoming more active, and drinking 8 glasses of water a day (64 ounces) Don't skip meals, especially breakfast, as skipping meals may alter your metabolism Do not use over-the-counter weight loss pills or gimmicks that claim rapid weight loss A healthy BMI (or body mass index) is between 18.5 and 24.9 You can calculate your ideal BMI at the [place] website ClubMonetize.fr   Obesity, Adult Obesity is the condition of having too much total body fat. Being overweight or obese means that your weight is greater than what is considered healthy for your body size. Obesity is determined by a measurement called BMI. BMI is an estimate of body fat and is calculated from height and weight. For adults, a BMI of 30 or higher is considered obese. Obesity can eventually lead to other health concerns and major illnesses,  including:  Stroke.  Coronary artery disease (CAD).  Type 2 diabetes.  Some types of cancer, including cancers of the colon, breast, uterus, and gallbladder.  Osteoarthritis.  High blood pressure (hypertension).  High cholesterol.  Sleep apnea.  Gallbladder stones.  Infertility problems. What are the causes? The main cause of obesity is taking in (consuming) more calories than your body uses for energy. Other factors that contribute to this condition may include:  Being born with genes that make you more likely to become obese.  Having a medical condition that causes obesity. These conditions include: ? Hypothyroidism. ? Polycystic ovarian syndrome (PCOS). ? Binge-eating disorder. ? Cushing syndrome.  Taking certain medicines, such as steroids, antidepressants, and seizure medicines.  Not being physically active (sedentary lifestyle).  Living where there are limited places to exercise safely or buy healthy foods.  Not getting enough sleep. What increases the risk? The following factors may increase your risk of this condition:  Having a family history of obesity.  Being a woman of African-American descent.  Being a man of Hispanic descent. What are the signs or symptoms? Having excessive body fat is the main symptom of this condition. How is this diagnosed? This condition may be diagnosed based on:  Your symptoms.  Your medical history.  A physical exam. Your health care provider may measure: ? Your BMI. If you are an adult with a BMI between 25 and less than 30, you are considered overweight. If you are an adult with a BMI of 30 or higher, you are considered obese. ? The distances around your hips and your waist (  circumferences). These may be compared to each other to help diagnose your condition. ? Your skinfold thickness. Your health care provider may gently pinch a fold of your skin and measure it. How is this treated? Treatment for this condition  often includes changing your lifestyle. Treatment may include some or all of the following:  Dietary changes. Work with your health care provider and a dietitian to set a weight-loss goal that is healthy and reasonable for you. Dietary changes may include eating: ? Smaller portions. A portion size is the amount of a particular food that is healthy for you to eat at one time. This varies from person to person. ? Low-calorie or low-fat options. ? More whole grains, fruits, and vegetables.  Regular physical activity. This may include aerobic activity (cardio) and strength training.  Medicine to help you lose weight. Your health care provider may prescribe medicine if you are unable to lose 1 pound a week after 6 weeks of eating more healthily and doing more physical activity.  Surgery. Surgical options may include gastric banding and gastric bypass. Surgery may be done if: ? Other treatments have not helped to improve your condition. ? You have a BMI of 40 or higher. ? You have life-threatening health problems related to obesity. Follow these instructions at home:  Eating and drinking   Follow recommendations from your health care provider about what you eat and drink. Your health care provider may advise you to: ? Limit fast foods, sweets, and processed snack foods. ? Choose low-fat options, such as low-fat milk instead of whole milk. ? Eat 5 or more servings of fruits or vegetables every day. ? Eat at home more often. This gives you more control over what you eat. ? Choose healthy foods when you eat out. ? Learn what a healthy portion size is. ? Keep low-fat snacks on hand. ? Avoid sugary drinks, such as soda, fruit juice, iced tea sweetened with sugar, and flavored milk. ? Eat a healthy breakfast.  Drink enough water to keep your urine clear or pale yellow.  Do not go without eating for long periods of time (do not fast) or follow a fad diet. Fasting and fad diets can be unhealthy  and even dangerous. Physical Activity  Exercise regularly, as told by your health care provider. Ask your health care provider what types of exercise are safe for you and how often you should exercise.  Warm up and stretch before being active.  Cool down and stretch after being active.  Rest between periods of activity. Lifestyle  Limit the time that you spend in front of your TV, computer, or video game system.  Find ways to reward yourself that do not involve food.  Limit alcohol intake to no more than 1 drink a day for nonpregnant women and 2 drinks a day for men. One drink equals 12 oz of beer, 5 oz of wine, or 1 oz of hard liquor. General instructions  Keep a weight loss journal to keep track of the food you eat and how much you exercise you get.  Take over-the-counter and prescription medicines only as told by your health care provider.  Take vitamins and supplements only as told by your health care provider.  Consider joining a support group. Your health care provider may be able to recommend a support group.  Keep all follow-up visits as told by your health care provider. This is important. Contact a health care provider if:  You are unable to meet  your weight loss goal after 6 weeks of dietary and lifestyle changes. This information is not intended to replace advice given to you by your health care provider. Make sure you discuss any questions you have with your health care provider. Document Released: 06/06/2004 Document Revised: 10/02/2015 Document Reviewed: 02/15/2015 Elsevier Interactive Patient Education  2019 Elsevier Inc.  Preventing Unhealthy [employer], Adult Staying at a healthy weight is important to your overall health. When fat builds up in your body, you may become overweight or obese. Being overweight or obese increases your risk of developing certain health problems, such as heart disease, diabetes, sleeping problems, joint problems, and some types of  cancer. Unhealthy weight gain is often the result of making unhealthy food choices or not getting enough exercise. You can make changes to your lifestyle to prevent obesity and stay as healthy as possible. What nutrition changes can be made?   Eat only as much as your body needs. To do this: ? Pay attention to signs that you are hungry or full. Stop eating as soon as you feel full. ? If you feel hungry, try drinking water first before eating. Drink enough water so your urine is clear or pale yellow. ? Eat smaller portions. Pay attention to portion sizes when eating out. ? Look at serving sizes on food labels. Most foods contain more than one serving per container. ? Eat the recommended number of calories for your gender and activity level. For most active people, a daily total of 2,000 calories is appropriate. If you are trying to lose weight or are not very active, you may need to eat fewer calories. Talk with your health care provider or a diet and nutrition specialist (dietitian) about how many calories you need each day.  Choose healthy foods, such as: ? Fruits and vegetables. At each meal, try to fill at least half of your plate with fruits and vegetables. ? Whole grains, such as whole-wheat bread, brown rice, and quinoa. ? Lean meats, such as chicken or fish. ? Other healthy proteins, such as beans, eggs, or tofu. ? Healthy fats, such as nuts, seeds, fatty fish, and olive oil. ? Low-fat or fat-free dairy products.  Check food labels, and avoid food and drinks that: ? Are high in calories. ? Have added sugar. ? Are high in sodium. ? Have saturated fats or trans fats.  Cook foods in healthier ways, such as by baking, broiling, or grilling.  Make a meal plan for the week, and shop with a grocery list to help you stay on track with your purchases. Try to avoid going to the grocery store when you are hungry.  When grocery shopping, try to shop around the outside of the store first,  where the fresh foods are. Doing this helps you to avoid prepackaged foods, which can be high in sugar, salt (sodium), and fat. What lifestyle changes can be made?   Exercise for 30 or more minutes on 5 or more days each week. Exercising may include brisk walking, yard work, biking, running, swimming, and team sports like basketball and soccer. Ask your health care provider which exercises are safe for you.  Do muscle-strengthening activities, such as lifting weights or using resistance bands, on 2 or more days a week.  Do not use any products that contain nicotine or tobacco, such as cigarettes and e-cigarettes. If you need help quitting, ask your health care provider.  Limit alcohol intake to no more than 1 drink a day for nonpregnant  women and 2 drinks a day for men. One drink equals 12 oz of beer, 5 oz of wine, or 1 oz of hard liquor.  Try to get 7-9 hours of sleep each night. What other changes can be made?  Keep a food and activity journal to keep track of: ? What you ate and how many calories you had. Remember to count the calories in sauces, dressings, and side dishes. ? Whether you were active, and what exercises you did. ? Your calorie, weight, and activity goals.  Check your weight regularly. Track any changes. If you notice you have gained weight, make changes to your diet or activity routine.  Avoid taking weight-loss medicines or supplements. Talk to your health care provider before starting any new medicine or supplement.  Talk to your health care provider before trying any new diet or exercise plan. Why are these changes important? Eating healthy, staying active, and having healthy habits can help you to prevent obesity. Those changes also:  Help you manage stress and emotions.  Help you connect with friends and family.  Improve your self-esteem.  Improve your sleep.  Prevent long-term health problems. What can happen if changes are not made? Being obese or  overweight can cause you to develop joint or bone problems, which can make it hard for you to stay active or do activities you enjoy. Being obese or overweight also puts stress on your heart and lungs and can lead to health problems like diabetes, heart disease, and some cancers. Where to find more information Talk with your health care provider or a dietitian about healthy eating and healthy lifestyle choices. You may also find information from:  U.S. Department of Agriculture, MyPlate: FormerBoss.no  American Heart Association: www.heart.org  Centers for Disease Control and Prevention: http://www.wolf.info/ Summary  Staying at a healthy weight is important to your overall health. It helps you to prevent certain diseases and health problems, such as heart disease, diabetes, joint problems, sleep disorders, and some types of cancer.  Being obese or overweight can cause you to develop joint or bone problems, which can make it hard for you to stay active or do activities you enjoy.  You can prevent unhealthy weight gain by eating a healthy diet, exercising regularly, not smoking, limiting alcohol, and getting enough sleep.  Talk with your health care provider or a dietitian for guidance about healthy eating and healthy lifestyle choices. This information is not intended to replace advice given to you by your health care provider. Make sure you discuss any questions you have with your health care provider. Document Released: 04/30/2016 Document Revised: 02/07/2017 Document Reviewed: 06/05/2016 Elsevier Interactive Patient Education  2019 [employer].

## 2018-06-15 NOTE — Assessment & Plan Note (Signed)
Encouraged stress reduction; DASH guidelines, weight loss

## 2018-06-15 NOTE — Assessment & Plan Note (Signed)
Check lipids today; try to limit saturated fats

## 2018-06-15 NOTE — Addendum Note (Signed)
Addended by: Sanda Dejoy, Satira Anis on: 06/15/2018 08:27 AM   Modules accepted: Orders

## 2018-06-15 NOTE — Assessment & Plan Note (Signed)
Check today 

## 2018-06-15 NOTE — Assessment & Plan Note (Signed)
Seeing GI specialist; check alk phos

## 2018-06-15 NOTE — Progress Notes (Signed)
BP 130/82   Pulse 85   Temp 98.4 F (36.9 C)   Ht '5\' 6"'$  (1.676 m)   Wt 212 lb 4.8 oz (96.3 kg)   SpO2 97%   BMI 34.27 kg/m    Subjective:    Patient ID: [name], female    DOB: [birth year], 63 y.o.   MRN: [id number]  HPI: [name] is a 63 y.o. female  Chief Complaint  Patient presents with  . Follow-up    HPI Patient is here for f/u HTN; did not take her medicine this morning because she came fasting; trying to avoid salt; sometimes eats chips  Sister had a stroke and is still in the hospital; she is taking care of another sister with chemo; she falls and cannot rest well through the night; insomnia; trouble staying asleep; night-wake awakenings; taking medicine but feels bad the next day; she does not want any medicine for depression or anxiety; she is part of a faith community; prozac made her hair fall out  High cholesterol; on statin; not eliminating fatty meats like she should; she forgets the cholesterol medicine at night (okay to take during the day)  Lab Results  Component Value Date   CHOL 127 12/11/2017   HDL 47 (L) 12/11/2017   LDLCALC 64 12/11/2017   TRIG 79 12/11/2017   CHOLHDL 2.7 12/11/2017   She was here for a cough, cold symptoms a few week ago; had cough with ACE-I and asked if ARB can do this; she has dry heat; no fevers; treated with amoxicillin; took that course; no diarrhea  Vit D deficiency  Prediabetes; no dry mouth; drinks fewer sugary drinks  Low platelet count; no bleeding  Seeing Dr. Bonna Gains; checking her alk phos; eating smaller portions, slowing down with meals, taking her time  Depression screen [facility] 2/9 06/15/2018 05/27/2018 12/11/2017 11/10/2017 11/10/2017  Decreased Interest 3 0 0 0 0  Down, Depressed, Hopeless '3 2 1 '$ 0 0  PHQ - 2 Score '6 2 1 '$ 0 0  Altered sleeping '3 1 1 1 '$ -  Tired, decreased energy '3 1 3 3 '$ -  Change in appetite 0 0 2 0 -  Feeling bad or failure about yourself  0 0 0 0 -  Trouble concentrating 0 0 0 0 -    Moving slowly or fidgety/restless 0 0 0 0 -  Suicidal thoughts 0 0 0 0 -  PHQ-9 Score '12 4 7 4 '$ -  Difficult doing work/chores Somewhat difficult Somewhat difficult - - -   Fall Risk  06/15/2018 05/27/2018 12/11/2017 11/10/2017 07/25/2017  Falls in the past year? 0 0 No No No  Number falls in past yr: - 0 - - -  Injury with Fall? - 0 - - -    Relevant past medical, surgical, family and social history reviewed Past Medical History:  Diagnosis Date  . Anemia   . Benign hypertensive renal disease   . DDD (degenerative disc disease), lumbar   . Elevated alkaline phosphatase level   . Fibrocystic disease of both breasts   . GERD (gastroesophageal reflux disease)   . Hyperlipidemia   . Hypertension   . Impaired fasting glucose   . Insomnia   . Menopausal state   . Migraine   . Osteoarthritis   . 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 Perforation of nasal septum   . Pre-diabetes   . Recurrent major depression ([place])   . Sleep apnea    Does not  use CPAP  . Spondylosis   . Thrombocytosis ([place]) 10/01/2015   mild  . Wears dentures    full upper   Past Surgical History:  Procedure Laterality Date  . ABDOMINAL HYSTERECTOMY     partial with bladder suspension, no cervix  . CHOLECYSTECTOMY  2003  . COLONOSCOPY  2008   1 polyp- return 10 years  . COLONOSCOPY WITH PROPOFOL N/A 06/30/2017   Procedure: COLONOSCOPY WITH PROPOFOL;  Surgeon: Lucilla Lame, MD;  Location: [facility];  Service: Endoscopy;  Laterality: N/A;  slep apnea  . KNEE SURGERY Right 2014  . PITUITARY SURGERY     x2 30+ years ago  . 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 Diabetes Mother   . Hypertension Mother   . Cancer Mother        possible liver  . Heart disease Father   . Hypertension Father   . Heart attack Father   . Alzheimer's disease Father   . Diabetes Sister   . Hypertension Sister   . Cancer Sister         lung?  . Stroke Sister   . Atrial fibrillation Sister   . Breast cancer Maternal Grandmother   . Cancer Sister   . Lymphoma Sister   . Stroke Sister    Social History   Tobacco Use  . Smoking status: Passive Smoke Exposure - Never Smoker  . Smokeless tobacco: Never Used  Substance Use Topics  . Alcohol use: No    Comment: rare (1-2 drinks/yr)  . Drug use: No     [facility] Visit from 06/15/2018 in [facility]  AUDIT-C Score  0      Interim medical history since last visit reviewed. Allergies and medications reviewed  Review of Systems Per HPI unless specifically indicated above     Objective:    BP 130/82   Pulse 85   Temp 98.4 F (36.9 C)   Ht '5\' 6"'$  (1.676 m)   Wt 212 lb 4.8 oz (96.3 kg)   SpO2 97%   BMI 34.27 kg/m   Wt Readings from Last 3 Encounters:  06/15/18 212 lb 4.8 oz (96.3 kg)  05/27/18 210 lb 1.6 oz (95.3 kg)  04/08/18 212 lb 12.8 oz (96.5 kg)    Physical Exam Constitutional:      General: She is not in acute distress.    Appearance: She is well-developed. She is obese. She is not diaphoretic.  HENT:     Head: Normocephalic and atraumatic.  Eyes:     General: No scleral icterus. Neck:     Thyroid: No thyromegaly.  Cardiovascular:     Rate and Rhythm: Normal rate and regular rhythm.     Heart sounds: Normal heart sounds. No murmur.  Pulmonary:     Effort: Pulmonary effort is normal. No respiratory distress.     Breath sounds: Normal breath sounds. No wheezing.  Abdominal:     General: Bowel sounds are normal. There is no distension.     Palpations: Abdomen is soft.  Skin:    General: Skin is warm and dry.     Coloration: Skin is not pale.  Neurological:     Mental Status: She is alert.  Psychiatric:        Behavior: Behavior normal.        Thought Content: Thought content normal.  Judgment: Judgment normal.     Results for orders placed or performed in visit on 04/08/18  H. pylori breath test  Result Value  Ref Range   H pylori Breath Test Negative Negative      Assessment & Plan:   Problem List Items Addressed This Visit      Cardiovascular and Mediastinum   Hypertension - Primary    Encouraged stress reduction; DASH guidelines, weight loss        Hematopoietic and Hemostatic   Thrombocytosis (HCC)    Check CBC      Relevant Orders   CBC     Other   Medication monitoring encounter   Relevant Orders   COMPLETE METABOLIC PANEL WITH GFR   Vitamin D deficiency    Check level      Relevant Orders   VITAMIN D 25 Hydroxy (Vit-D Deficiency, Fractures)   Recurrent major depression ([place])    Start antidepressant; encouraged self love and time for self; counseling encouraged      Prediabetes    Check today      Relevant Orders   Hemoglobin A1c   Obesity    I am here to help if/when interested; treating depression may help she thinks      Insomnia    Treat depression; self care; take trazodone earlier in the night      Hyperlipidemia    Check lipids today; try to limit saturated fats      Relevant Orders   Lipid panel   Breast cancer screening    Order mammogram      Relevant Orders   MM 3D SCREEN BREAST BILATERAL   Alkaline phosphatase elevation    Seeing GI specialist; check alk phos       Other Visit Diagnoses    Other fatigue       Relevant Orders   TSH       Follow up plan: No follow-ups on file.  An after-visit summary was printed and given to the patient at [place].  Please see the patient instructions which may contain other information and recommendations beyond what is mentioned above in the assessment and plan.  No orders of the defined types were placed in this encounter.   Orders Placed This Encounter  Procedures  . MM 3D SCREEN BREAST BILATERAL  . CBC  . COMPLETE METABOLIC PANEL WITH GFR  . Hemoglobin A1c  . Lipid panel  . VITAMIN D 25 Hydroxy (Vit-D Deficiency, Fractures)  . TSH

## 2018-06-16 ENCOUNTER — Other Ambulatory Visit: Payer: Self-pay | Admitting: Family Medicine

## 2018-06-16 LAB — COMPLETE METABOLIC PANEL WITH GFR
AG Ratio: 1.2 (calc) (ref 1.0–2.5)
ALT: 18 U/L (ref 6–29)
AST: 18 U/L (ref 10–35)
Albumin: 4 g/dL (ref 3.6–5.1)
Alkaline phosphatase (APISO): 144 U/L (ref 37–153)
BUN: 14 mg/dL (ref 7–25)
CO2: 28 mmol/L (ref 20–32)
Calcium: 9.7 mg/dL (ref 8.6–10.4)
Chloride: 107 mmol/L (ref 98–110)
Creat: 0.9 mg/dL (ref 0.50–0.99)
GFR, Est African American: 79 mL/min/{1.73_m2} (ref >=60)
GFR, Est Non African American: 68 mL/min/{1.73_m2} (ref >=60)
Globulin: 3.3 g/dL (calc) (ref 1.9–3.7)
Glucose, Bld: 122 mg/dL — ABNORMAL HIGH (ref 65–99)
Potassium: 4.5 mmol/L (ref 3.5–5.3)
Sodium: 141 mmol/L (ref 135–146)
Total Bilirubin: 0.4 mg/dL (ref 0.2–1.2)
Total Protein: 7.3 g/dL (ref 6.1–8.1)

## 2018-06-16 LAB — CBC
HCT: 37.8 % (ref 35.0–45.0)
Hemoglobin: 12.2 g/dL (ref 11.7–15.5)
MCH: 26.5 pg — ABNORMAL LOW (ref 27.0–33.0)
MCHC: 32.3 g/dL (ref 32.0–36.0)
MCV: 82.2 fL (ref 80.0–100.0)
MPV: 10 fL (ref 7.5–12.5)
Platelets: 434 10*3/uL — ABNORMAL HIGH (ref 140–400)
RBC: 4.6 10*6/uL (ref 3.80–5.10)
RDW: 14.5 % (ref 11.0–15.0)
WBC: 4.8 10*3/uL (ref 3.8–10.8)

## 2018-06-16 LAB — LIPID PANEL
Cholesterol: 184 mg/dL (ref <200)
HDL: 50 mg/dL (ref >=50)
LDL Cholesterol (Calc): 117 mg/dL (calc) — ABNORMAL HIGH
Non-HDL Cholesterol (Calc): 134 mg/dL (calc) — ABNORMAL HIGH (ref <130)
Total CHOL/HDL Ratio: 3.7 (calc) (ref <5.0)
Triglycerides: 74 mg/dL (ref <150)

## 2018-06-16 LAB — TSH: TSH: 0.89 mIU/L (ref 0.40–4.50)

## 2018-06-16 LAB — HEMOGLOBIN A1C
Hgb A1c MFr Bld: 6.5 % of total Hgb — ABNORMAL HIGH (ref <5.7)
Mean Plasma Glucose: 140 (calc)
eAG (mmol/L): 7.7 (calc)

## 2018-06-16 LAB — VITAMIN D 25 HYDROXY (VIT D DEFICIENCY, FRACTURES): Vit D, 25-Hydroxy: 19 ng/mL — ABNORMAL LOW (ref 30–100)

## 2018-06-16 MED ORDER — VITAMIN D (ERGOCALCIFEROL) 1.25 MG (50000 UNIT) PO CAPS: 50000.0000 [IU] | ORAL_CAPSULE | ORAL | 1 refills | Status: DC

## 2018-06-16 NOTE — Progress Notes (Signed)
Recheck A1c this week or next

## 2018-06-17 ENCOUNTER — Telehealth: Payer: Self-pay | Admitting: Family Medicine

## 2018-06-17 NOTE — Telephone Encounter (Signed)
Copied from [place] [id number]. Topic: General - Other >> Jun 17, 2018  1:05 PM Keene Breath wrote: Reason for CRM: Patient called to request that the nurse or doctor call her regarding some blood work that the doctor wanted her to have.  Patient stated that she wanted to inform the doctor about some medication that she had stopped taking and wanted to know if this had affected some of her results.  Please call patient back at [phone]

## 2018-06-17 NOTE — Telephone Encounter (Signed)
I was notified by staff that the normal reference range for alk phos for [facility] is indeed now 37-153 Patient's alk phos is therefore normal 

## 2018-06-18 NOTE — Telephone Encounter (Signed)
Called patient. She was very emotional with the stuff she has going on in her life. She wanted to me ask you if she needed to come in before her next appt, 07/13/18, for her bloodwork. She stated she has not been eating like she's supposed to and has not been taking her medications correctly and wanted to know if that could have affected her last labs and wanted to ask if she could have until her next appt to get herself together before she has bloodwork done again or would you like her to have the labs drawn before then. Please advise.

## 2018-06-18 NOTE — Telephone Encounter (Signed)
So my concern is that the A1c is in the "diabetes" range I don't like to diagnose someone with diabetes based on a single test result, as there is variation and lab error That is why I wanted to repeat it quickly It may be a completely different number one month from now, but we want to see if THIS reading is legit Please ask her to come by, even if just for a CMA visit, to get the POCT A1c on the books; I need to know if she truly has diabetes or not because it changes so much of our management

## 2018-06-19 ENCOUNTER — Ambulatory Visit (INDEPENDENT_AMBULATORY_CARE_PROVIDER_SITE_OTHER): Payer: 59

## 2018-06-19 DIAGNOSIS — R7309 Other abnormal glucose: Secondary | ICD-10-CM

## 2018-06-19 LAB — POCT GLYCOSYLATED HEMOGLOBIN (HGB A1C)
HbA1c POC (<> result, manual entry): 6.4 % (ref 4.0–5.6)
HbA1c, POC (controlled diabetic range): 6.4 % (ref 0.0–7.0)
HbA1c, POC (prediabetic range): 6.4 % (ref 5.7–6.4)
Hemoglobin A1C: 6.4 % — AB (ref 4.0–5.6)

## 2018-06-19 NOTE — Telephone Encounter (Signed)
Pt notified, I told her it would be quick visit and she states will try to come in?

## 2018-06-25 NOTE — Progress Notes (Signed)
[name], please let the patient know that her recheck A1c was still in the prediabetes range; she is getting really close though to have type 2 diabetes; we'll encourage her to take care of herself, eat well, lose weight, exercise; we know it is hard with all the demands placed on her with caring for her family, but self-care is so important Ask if she has been taking metformin at all; it's in her med list and wasn't marked as "not taking" last visit; however, it hasn't been prescribed in a long time; if she has been off of it, we'll encourage her to start back and you can let me know if she needs a prescription; I'd recommend one a day if she can tolerate it; thank you

## 2018-07-07 ENCOUNTER — Other Ambulatory Visit: Payer: Self-pay | Admitting: Family Medicine

## 2018-07-09 ENCOUNTER — Other Ambulatory Visit: Payer: Self-pay | Admitting: Family Medicine

## 2018-07-13 ENCOUNTER — Ambulatory Visit: Payer: 59 | Admitting: Family Medicine

## 2018-07-19 ENCOUNTER — Other Ambulatory Visit: Payer: Self-pay | Admitting: Family Medicine

## 2018-07-20 ENCOUNTER — Ambulatory Visit: Payer: 59 | Admitting: Family Medicine

## 2018-07-21 ENCOUNTER — Encounter: Payer: Self-pay | Admitting: Family Medicine

## 2018-07-21 ENCOUNTER — Ambulatory Visit (INDEPENDENT_AMBULATORY_CARE_PROVIDER_SITE_OTHER): Payer: 59 | Admitting: Family Medicine

## 2018-07-21 DIAGNOSIS — E559 Vitamin D deficiency, unspecified: Secondary | ICD-10-CM

## 2018-07-21 DIAGNOSIS — R7301 Impaired fasting glucose: Secondary | ICD-10-CM | POA: Diagnosis not present

## 2018-07-21 DIAGNOSIS — Z6833 Body mass index (BMI) 33.0-33.9, adult: Secondary | ICD-10-CM

## 2018-07-21 DIAGNOSIS — D473 Essential (hemorrhagic) thrombocythemia: Secondary | ICD-10-CM

## 2018-07-21 DIAGNOSIS — F3341 Major depressive disorder, recurrent, in partial remission: Secondary | ICD-10-CM | POA: Diagnosis not present

## 2018-07-21 DIAGNOSIS — E6609 Other obesity due to excess calories: Secondary | ICD-10-CM

## 2018-07-21 DIAGNOSIS — D75839 Thrombocytosis, unspecified: Secondary | ICD-10-CM

## 2018-07-21 MED ORDER — VITAMIN D (ERGOCALCIFEROL) 1.25 MG (50000 UNIT) PO CAPS: 50000.0000 [IU] | ORAL_CAPSULE | ORAL | 0 refills | Status: DC

## 2018-07-21 NOTE — Progress Notes (Signed)
BP 124/76   Pulse 73   Temp 98.3 F (36.8 C) (Oral)   Resp 12   Ht 5\' 6"  (1.676 m)   Wt 208 lb (94.3 kg)   SpO2 98%   BMI 33.57 kg/m    Subjective:    Patient ID: [name], female    DOB: 10/10/1955, 63 y.o.   MRN: [id number]  HPI: [name] is a 63 y.o. female  Chief Complaint  Patient presents with  . Follow-up    HPI She is here for f/u Type 2 diabetes versus prediabetes; only one reading of 6.5; all other readings are in the PREdiabetes range Caregiver stress; up lots of hours, not getting enough sleep Mood is better; think she is handling it pretty well; no SI/HI Obesity; she is losing weight on purpose; limiting portions mainly Acid reflux is better with limiting portions Allergic rhinitis; okay to use OTC Vitamin D; pharmacy did not show the refill on last Rx from Feb; did take the 4 weeks, can tell some more energy High cholesterol; on statin from Dr. Rockey Situ Lab Results  Component Value Date   HGBA1C 6.4 (A) 06/19/2018   HGBA1C 6.4 06/19/2018   HGBA1C 6.4 06/19/2018   HGBA1C 6.4 06/19/2018     Depression screen [facility] 2/9 07/21/2018 06/15/2018 05/27/2018 12/11/2017 11/10/2017  Decreased Interest 0 3 0 0 0  Down, Depressed, Hopeless 0 3 2 1  0  PHQ - 2 Score 0 6 2 1  0  Altered sleeping 0 3 1 1 1   Tired, decreased energy 0 3 1 3 3   Change in appetite 0 0 0 2 0  Feeling bad or failure about yourself  0 0 0 0 0  Trouble concentrating 0 0 0 0 0  Moving slowly or fidgety/restless 0 0 0 0 0  Suicidal thoughts 0 0 0 0 0  PHQ-9 Score 0 12 4 7 4   Difficult doing work/chores Not difficult at all Somewhat difficult Somewhat difficult - -   Fall Risk  07/21/2018 06/15/2018 05/27/2018 12/11/2017 11/10/2017  Falls in the past year? 0 0 0 No No  Number falls in past yr: 0 - 0 - -  Injury with Fall? 0 - 0 - -    Relevant past medical, surgical, family and social history reviewed Past Medical History:  Diagnosis Date  . Anemia   . Benign hypertensive renal disease     . DDD (degenerative disc disease), lumbar   . Elevated alkaline phosphatase level   . Fibrocystic disease of both breasts   . GERD (gastroesophageal reflux disease)   . Hyperlipidemia   . Hypertension   . Impaired fasting glucose   . Insomnia   . Menopausal state   . Migraine   . Osteoarthritis   . 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 Perforation of nasal septum   . Pre-diabetes   . Recurrent major depression ([place])   . Sleep apnea    Does not use CPAP  . Spondylosis   . Thrombocytosis ([place]) 10/01/2015   mild  . Wears dentures    full upper   Past Surgical History:  Procedure Laterality Date  . ABDOMINAL HYSTERECTOMY     partial with bladder suspension, no cervix  . CHOLECYSTECTOMY  2003  . COLONOSCOPY  2008   1 polyp- return 10 years  . COLONOSCOPY WITH PROPOFOL N/A 06/30/2017   Procedure: COLONOSCOPY WITH PROPOFOL;  Surgeon: Lucilla Lame, MD;  Location: [facility];  Service: Endoscopy;  Laterality: N/A;  slep apnea  . KNEE SURGERY Right 2014  . PITUITARY SURGERY     x2 30+ years ago  . 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 Diabetes Mother   . Hypertension Mother   . Cancer Mother        possible liver  . Heart disease Father   . Hypertension Father   . Heart attack Father   . Alzheimer's disease Father   . Diabetes Sister   . Hypertension Sister   . Cancer Sister        lung?  . Stroke Sister   . Atrial fibrillation Sister   . Breast cancer Maternal Grandmother   . Cancer Sister   . Lymphoma Sister   . Stroke Sister    Social History   Tobacco Use  . Smoking status: Passive Smoke Exposure - Never Smoker  . Smokeless tobacco: Never Used  Substance Use Topics  . Alcohol use: No    Comment: rare (1-2 drinks/yr)  . Drug use: No     [facility] Visit from 07/21/2018 in [facility]  AUDIT-C Score  1      Interim medical history since last  visit reviewed. Allergies and medications reviewed  Review of Systems  Constitutional: Negative for unexpected weight change (no, this is intentional).   Per HPI unless specifically indicated above     Objective:    BP 124/76   Pulse 73   Temp 98.3 F (36.8 C) (Oral)   Resp 12   Ht 5\' 6"  (1.676 m)   Wt 208 lb (94.3 kg)   SpO2 98%   BMI 33.57 kg/m   Wt Readings from Last 3 Encounters:  07/21/18 208 lb (94.3 kg)  06/15/18 212 lb 4.8 oz (96.3 kg)  05/27/18 210 lb 1.6 oz (95.3 kg)    Physical Exam Constitutional:      General: She is not in acute distress.    Appearance: She is well-developed. She is obese.  Eyes:     General: No scleral icterus. Cardiovascular:     Rate and Rhythm: Normal rate and regular rhythm.  Pulmonary:     Effort: Pulmonary effort is normal.     Breath sounds: Normal breath sounds.  Neurological:     Mental Status: She is alert.  Psychiatric:        Behavior: Behavior normal.     Diabetic Foot Form - Detailed   Diabetic Foot Exam - detailed Diabetic Foot exam was performed with the following findings:  Yes 07/21/2018  1:36 PM  Visual Foot Exam completed.:  Yes  Pulse Foot Exam completed.:  Yes  Right Dorsalis Pedis:  Present Left Dorsalis Pedis:  Present  Sensory Foot Exam Completed.:  Yes Semmes-Weinstein Monofilament Test R Site 1-Great Toe:  Pos L Site 1-Great Toe:  Pos        Results for orders placed or performed in visit on 06/19/18  POCT HgB A1C  Result Value Ref Range   Hemoglobin A1C 6.4 (A) 4.0 - 5.6 %   HbA1c POC (<> result, manual entry) 6.4 4.0 - 5.6 %   HbA1c, POC (prediabetic range) 6.4 5.7 - 6.4 %   HbA1c, POC (controlled diabetic range) 6.4 0.0 - 7.0 %      Assessment & Plan:   Problem List Items Addressed This Visit      Endocrine   Impaired fasting glucose  Only one reading of 6.5 and NO confirmatory glucose readings of 126 or higher; we'll call this PREdiabetes; foot exam still done; she'll continue  statin and really work on diet and weight loss        Hematopoietic and Hemostatic   Thrombocytosis (HCC)    Recheck CBC in mid May        Other   Vitamin D deficiency    Refilled that for another 4 weeks; recheck in May      Recurrent major depression ([place])    Feeling better; not clinically depressed today; call me if mood worsens; continue med; offered social work for resources; glad she is seeking help from family      Obesity    Praise given for her efforts and weigh tloss          Follow up plan: Return in about 8 weeks (around 09/18/2018) for follow-up visit with Dr. Sanda Klein or just AFTER.  An after-visit summary was printed and given to the patient at [place].  Please see the patient instructions which may contain other information and recommendations beyond what is mentioned above in the assessment and plan.  Meds ordered this encounter  Medications  . Vitamin D, Ergocalciferol, (DRISDOL) 1.25 MG (50000 UT) CAPS capsule    Sig: Take 1 capsule (50,000 Units total) by mouth every 7 (seven) days.    Dispense:  4 capsule    Refill:  0    No orders of the defined types were placed in this encounter.

## 2018-07-21 NOTE — Assessment & Plan Note (Signed)
Recheck CBC in mid May

## 2018-07-21 NOTE — Assessment & Plan Note (Signed)
Praise given for her efforts and weigh tloss

## 2018-07-21 NOTE — Assessment & Plan Note (Signed)
Feeling better; not clinically depressed today; call me if mood worsens; continue med; offered social work for resources; glad she is seeking help from family

## 2018-07-21 NOTE — Assessment & Plan Note (Signed)
Refilled that for another 4 weeks; recheck in May

## 2018-07-21 NOTE — Assessment & Plan Note (Signed)
Only one reading of 6.5 and NO confirmatory glucose readings of 126 or higher; we'll call this PREdiabetes; foot exam still done; she'll continue statin and really work on diet and weight loss

## 2018-08-02 ENCOUNTER — Other Ambulatory Visit: Payer: Self-pay | Admitting: Family Medicine

## 2018-08-02 NOTE — Telephone Encounter (Signed)
Lab Results  Component Value Date   CREATININE 0.90 06/15/2018   Lab Results  Component Value Date   K 4.5 06/15/2018   Rx approved

## 2018-08-11 ENCOUNTER — Other Ambulatory Visit: Payer: Self-pay

## 2018-08-11 DIAGNOSIS — J0100 Acute maxillary sinusitis, unspecified: Secondary | ICD-10-CM

## 2018-08-11 MED ORDER — FLUTICASONE PROPIONATE 50 MCG/ACT NA SUSP: 2.0000 | NASAL | 0 refills | Status: DC | PRN

## 2018-08-11 NOTE — Telephone Encounter (Signed)
Refill request was sent to Elizabeth Poulose, DNP for approval and submission.  

## 2018-08-26 ENCOUNTER — Ambulatory Visit (INDEPENDENT_AMBULATORY_CARE_PROVIDER_SITE_OTHER): Payer: 59 | Admitting: Family Medicine

## 2018-08-26 ENCOUNTER — Other Ambulatory Visit: Payer: Self-pay

## 2018-08-26 DIAGNOSIS — R590 Localized enlarged lymph nodes: Secondary | ICD-10-CM

## 2018-08-26 DIAGNOSIS — H6690 Otitis media, unspecified, unspecified ear: Secondary | ICD-10-CM

## 2018-08-26 DIAGNOSIS — H60509 Unspecified acute noninfective otitis externa, unspecified ear: Secondary | ICD-10-CM | POA: Diagnosis not present

## 2018-08-26 MED ORDER — AMOXICILLIN-POT CLAVULANATE 875-125 MG PO TABS: 1.0000 | ORAL_TABLET | Freq: Two times a day (BID) | ORAL | 0 refills | Status: AC

## 2018-08-26 MED ORDER — CIPROFLOXACIN-DEXAMETHASONE 0.3-0.1 % OT SUSP: 4.0000 [drp] | Freq: Two times a day (BID) | OTIC | 0 refills | Status: AC

## 2018-08-26 NOTE — Progress Notes (Signed)
Name: [name]   MRN: [id number]    DOB: Mar 06, 1956   Date:08/26/2018       Progress Note  Subjective  Chief Complaint  Chief Complaint  Patient presents with  . Otalgia    that moves to teeth, fever 100 right side    I connected with  [name]  on 08/26/18 at 10:40 AM EDT by a video enabled telemedicine application and verified that I am speaking with the correct person using two identifiers.  I discussed the limitations of evaluation and management by telemedicine and the availability of in person appointments. The patient expressed understanding and agreed to proceed. Staff also discussed with the patient that there may be a patient responsible charge related to this service. Patient Location: Home Provider Location: Home Additional Individuals present: None  HPI  She presents for concern for RIGHT otalgia - feels like something is swollen on the inside x3-4 weeks.  Yesterday she had a fever of 100F, and temp came back down after taking tylenol last night, has not been able to recheck temp today.  She had an issue that was very similar in 2017, but is unsure if it was otitis externa or otitis media or some other issue.  She is retired, but does take care of her 2 sisters - no childcare.  She has tried taking Claritin without relief.  BP's have been well controlled, A1C has been well controlled. She has not had any drainage or bleeding from her ears, no hearing disturbance is noted.  Endorses itching in the ear.  Patient Active Problem List   Diagnosis Date Noted  . Hiatal hernia 11/10/2017  . Snores 07/25/2017  . Somnolence, daytime 07/25/2017  . Special screening for malignant neoplasms, colon   . Hypertension 06/13/2017  . Low back pain 02/19/2016  . Dysfunction of left eustachian tube 10/01/2015  . Vitamin D deficiency 10/01/2015  . Medication monitoring encounter 10/01/2015  . Thrombocytosis ([place]) 10/01/2015  . Alkaline phosphatase elevation 10/01/2015  .  Prediabetes 09/27/2015  . Perforated nasal septum 08/25/2015  . Preventative health care 02/21/2015  . Obesity 02/21/2015  . Breast cancer screening 02/16/2015  . GERD (gastroesophageal reflux disease)   . Osteoarthritis   . Benign hypertensive renal disease   . Hyperlipidemia   . Sleep apnea   . DDD (degenerative disc disease), lumbar   . Insomnia   . Impaired fasting glucose   . Recurrent major depression ([place])   . Migraine   . Pituitary adenoma ([place]) 11/12/2013  . 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 Smoking status: Passive Smoke Exposure - Never Smoker  . Smokeless tobacco: Never Used  Substance Use Topics  . Alcohol use: No    Comment: rare (1-2 drinks/yr)     Current Outpatient Medications:  .  amLODipine (NORVASC) 10 MG tablet, TAKE 1 TABLET (10 MG TOTAL) BY MOUTH DAILY. FOR BLOOD PRESSURE, Disp: 90 tablet, Rfl: 3 .  atorvastatin (LIPITOR) 20 MG tablet, Take 1 tablet (20 mg total) by mouth at bedtime., Disp: 90 tablet, Rfl: 3 .  escitalopram (LEXAPRO) 10 MG tablet, TAKE 1 TABLET BY MOUTH EVERY DAY, Disp: 90 tablet, Rfl: 1 .  fluticasone (FLONASE) 50 MCG/ACT nasal spray, Place 2 sprays into both nostrils as needed., Disp: 1 g, Rfl: 0 .  furosemide (LASIX) 20 MG tablet, Take 1 tablet (20 mg total) by mouth daily as needed. Please contact PCP for further refills. Dr Rockey Situ said  f/u on as needed basis., Disp: 90 tablet, Rfl: 2 .  loratadine (CLARITIN) 10 MG tablet, Take 1 tablet (10 mg total) by mouth daily. (Patient not taking: Reported on 07/21/2018), Disp: 30 tablet, Rfl: 0 .  losartan (COZAAR) 100 MG tablet, TAKE 1 TABLET BY MOUTH EVERY DAY, Disp: 90 tablet, Rfl: 3 .  metFORMIN (GLUCOPHAGE) 500 MG tablet, Take 500 mg by mouth every other day. , Disp: , Rfl: 3 .  polyethylene glycol powder (GLYCOLAX/MIRALAX) powder, as needed. , Disp: , Rfl: 0 .  traZODone (DESYREL) 50 MG tablet, Take 0.5-1 tablets (25-50 mg total) by mouth at bedtime as needed  for sleep., Disp: , Rfl:  .  Vitamin D, Ergocalciferol, (DRISDOL) 1.25 MG (50000 UT) CAPS capsule, Take 1 capsule (50,000 Units total) by mouth every 7 (seven) days., Disp: 4 capsule, Rfl: 0  Allergies  Allergen Reactions  . Lisinopril Cough       . Shrimp [Shellfish Allergy] Swelling    throat swells (lobster also)  . Sulfa Antibiotics Swelling    I personally reviewed active problem list, medication list, allergies, notes from last encounter, lab results with the patient/caregiver today.  ROS  Constitutional: Negative for fever or weight change.  Respiratory: Negative for cough and shortness of breath.   Cardiovascular: Negative for chest pain or palpitations.  Gastrointestinal: Negative for abdominal pain, no bowel changes.  Musculoskeletal: Negative for gait problem or joint swelling.  Skin: Negative for rash.  Neurological: Negative for dizziness or headache.  No other specific complaints in a complete review of systems (except as listed in HPI above). HEENT: See HPI  Objective  Virtual encounter, vitals not obtained.  There is no height or weight on file to calculate BMI.  Nursing Note and Vital Signs reviewed.  Physical Exam  Constitutional: Patient appears well-developed and well-nourished. No distress.  HENT: Head: Normocephalic and atraumatic. She shows LEFT mastoid and it is non-edematous and non-erythematous.  Neck: Normal range of motion. Patient notes some lymph node enlargement and mild tenderness along the preauricular and submandibular chains on the LEFT side only.  She has good jaw AROM. Pulmonary/Chest: Effort normal. No respiratory distress. Speaking in complete sentences Neurological: Pt is alert and oriented to person, place, and time. Coordination, speech and gait are normal.  Psychiatric: Patient has a normal mood and affect. behavior is normal. Judgment and thought content normal.  No results found for this or any previous visit (from the past 72  hour(s)).  Assessment & Plan  1. Acute otitis media, unspecified otitis media type - amoxicillin-clavulanate (AUGMENTIN) 875-125 MG tablet; Take 1 tablet by mouth 2 (two) times daily for 10 days.  Dispense: 20 tablet; Refill: 0  2. Lymphadenopathy, preauricular - amoxicillin-clavulanate (AUGMENTIN) 875-125 MG tablet; Take 1 tablet by mouth 2 (two) times daily for 10 days.  Dispense: 20 tablet; Refill: 0  3. Acute otitis externa, unspecified laterality, unspecified type - ciprofloxacin-dexamethasone (CIPRODEX) OTIC suspension; Place 4 drops into the left ear 2 (two) times daily for 7 days.  Dispense: 7.5 mL; Refill: 0  - We will treat with Augmentin due to lymph enlargement, reported fever, and inability for otoscopic examination due to virtual visit.  However, I do suspect there could be an otitis externa component to this based on report of feeling swollen and pruritic, so we will send in ciprodex as well.  I did advise Ciprodex is safe regardless of TM intact/not intact, and therefore is a good option for her without an otoscopic examination.  -  Red flags and when to present for emergency care or RTC including fever >101.38F, chest pain, shortness of breath, new/worsening/un-resolving symptoms, mastoid swelling reviewed with patient at time of visit. Follow up and care instructions discussed and provided in AVS. - I discussed the assessment and treatment plan with the patient. The patient was provided an opportunity to ask questions and all were answered. The patient agreed with the plan and demonstrated an understanding of the instructions.  I provided 16 minutes of non-face-to-face time during this encounter.  Hubbard Hartshorn, FNP

## 2018-09-04 ENCOUNTER — Other Ambulatory Visit: Payer: Self-pay | Admitting: Family Medicine

## 2018-09-04 ENCOUNTER — Other Ambulatory Visit: Payer: Self-pay | Admitting: Nurse Practitioner

## 2018-09-04 DIAGNOSIS — J0100 Acute maxillary sinusitis, unspecified: Secondary | ICD-10-CM

## 2018-09-21 ENCOUNTER — Ambulatory Visit: Payer: 59 | Admitting: Nurse Practitioner

## 2018-09-21 ENCOUNTER — Ambulatory Visit (INDEPENDENT_AMBULATORY_CARE_PROVIDER_SITE_OTHER): Payer: 59 | Admitting: Nurse Practitioner

## 2018-09-21 ENCOUNTER — Other Ambulatory Visit: Payer: Self-pay

## 2018-09-21 ENCOUNTER — Encounter: Payer: Self-pay | Admitting: Nurse Practitioner

## 2018-09-21 VITALS — BP 119/82 | HR 66 | Ht 66.0 in | Wt 206.0 lb

## 2018-09-21 DIAGNOSIS — J302 Other seasonal allergic rhinitis: Secondary | ICD-10-CM

## 2018-09-21 DIAGNOSIS — H6982 Other specified disorders of Eustachian tube, left ear: Secondary | ICD-10-CM

## 2018-09-21 MED ORDER — MONTELUKAST SODIUM 10 MG PO TABS: 10.0000 mg | ORAL_TABLET | Freq: Every day | ORAL | 3 refills | Status: DC

## 2018-09-21 NOTE — Progress Notes (Signed)
Virtual Visit via Video Note  I connected with [name] on 09/21/18 at 10:00 AM EDT by a video enabled telemedicine application and verified that I am speaking with the correct person using two identifiers.   Staff discussed the limitations of evaluation and management by telemedicine and the availability of in person appointments. The patient expressed understanding and agreed to proceed.  Patient location: work  My location: work office Other people present:  none HPI  States still having inner ear problem, states feels her left side is painful at time. She initially thought it was her bad left tooth. States left ear pain and post nasal drainage happening shortly after she is outside. States she started taking Claritin and tylenol sinus and congestion with minimal relief. States switched to ibuprofen sinus with more relief but symptoms come back every day when she is outside. States pain is mild but feels ear fullness and muffled sounds.   Endorses rhinorrhea and watery itchy eyes at times.   PHQ2/9: Depression screen [facility] 2/9 09/21/2018 08/26/2018 07/21/2018 06/15/2018 05/27/2018  Decreased Interest 0 1 0 3 0  Down, Depressed, Hopeless 0 1 0 3 2  PHQ - 2 Score 0 2 0 6 2  Altered sleeping 0 0 0 3 1  Tired, decreased energy 0 0 0 3 1  Change in appetite 0 0 0 0 0  Feeling bad or failure about yourself  0 0 0 0 0  Trouble concentrating 0 0 0 0 0  Moving slowly or fidgety/restless 0 0 0 0 0  Suicidal thoughts 0 0 0 0 0  PHQ-9 Score 0 2 0 12 4  Difficult doing work/chores Not difficult at all Not difficult at all Not difficult at all Somewhat difficult Somewhat difficult  Some recent data might be hidden     PHQ reviewed. Negative  Patient Active Problem List   Diagnosis Date Noted  . Hiatal hernia 11/10/2017  . Snores 07/25/2017  . Somnolence, daytime 07/25/2017  . Special screening for malignant neoplasms, colon   . Hypertension 06/13/2017  . Low back pain 02/19/2016  .  Dysfunction of left eustachian tube 10/01/2015  . Vitamin D deficiency 10/01/2015  . Medication monitoring encounter 10/01/2015  . Thrombocytosis ([place]) 10/01/2015  . Alkaline phosphatase elevation 10/01/2015  . Prediabetes 09/27/2015  . Perforated nasal septum 08/25/2015  . Preventative health care 02/21/2015  . Obesity 02/21/2015  . Breast cancer screening 02/16/2015  . GERD (gastroesophageal reflux disease)   . Osteoarthritis   . Benign hypertensive renal disease   . Hyperlipidemia   . Sleep apnea   . DDD (degenerative disc disease), lumbar   . Insomnia   . Impaired fasting glucose   . Recurrent major depression ([place])   . Migraine   . Pituitary adenoma ([place]) 11/12/2013  . 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 Anemia   . Benign hypertensive renal disease   . DDD (degenerative disc disease), lumbar   . Elevated alkaline phosphatase level   . Fibrocystic disease of both breasts   . GERD (gastroesophageal reflux disease)   . Hyperlipidemia   . Hypertension   . Impaired fasting glucose   . Insomnia   . Menopausal state   . Migraine   . Osteoarthritis   . 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 Perforation of nasal septum   . Pre-diabetes   . Recurrent major depression ([place])   . Sleep apnea    Does not use  CPAP  . Spondylosis   . Thrombocytosis ([place]) 10/01/2015   mild  . Wears dentures    full upper    Past Surgical History:  Procedure Laterality Date  . ABDOMINAL HYSTERECTOMY     partial with bladder suspension, no cervix  . CHOLECYSTECTOMY  2003  . COLONOSCOPY  2008   1 polyp- return 10 years  . COLONOSCOPY WITH PROPOFOL N/A 06/30/2017   Procedure: COLONOSCOPY WITH PROPOFOL;  Surgeon: Lucilla Lame, MD;  Location: [facility];  Service: Endoscopy;  Laterality: N/A;  slep apnea  . KNEE SURGERY Right 2014  . PITUITARY SURGERY     x2 30+ years ago  . transnasal approach pituitary adenoma resection  2015   clival/parasellar  mass    Social History   Tobacco Use  . Smoking status: Passive Smoke Exposure - Never Smoker  . Smokeless tobacco: Never Used  Substance Use Topics  . Alcohol use: No    Comment: rare (1-2 drinks/yr)     Current Outpatient Medications:  .  amLODipine (NORVASC) 10 MG tablet, TAKE 1 TABLET (10 MG TOTAL) BY MOUTH DAILY. FOR BLOOD PRESSURE, Disp: 90 tablet, Rfl: 3 .  atorvastatin (LIPITOR) 20 MG tablet, Take 1 tablet (20 mg total) by mouth at bedtime., Disp: 90 tablet, Rfl: 3 .  escitalopram (LEXAPRO) 10 MG tablet, TAKE 1 TABLET BY MOUTH EVERY DAY, Disp: 90 tablet, Rfl: 1 .  fluticasone (FLONASE) 50 MCG/ACT nasal spray, PLACE 2 SPRAYS INTO BOTH NOSTRILS AS NEEDED., Disp: 16 g, Rfl: 3 .  furosemide (LASIX) 20 MG tablet, Take 1 tablet (20 mg total) by mouth daily as needed. Please contact PCP for further refills. Dr Rockey Situ said f/u on as needed basis., Disp: 90 tablet, Rfl: 2 .  loratadine (CLARITIN) 10 MG tablet, Take 1 tablet (10 mg total) by mouth daily., Disp: 30 tablet, Rfl: 0 .  losartan (COZAAR) 100 MG tablet, TAKE 1 TABLET BY MOUTH EVERY DAY, Disp: 90 tablet, Rfl: 3 .  metFORMIN (GLUCOPHAGE) 500 MG tablet, Take 500 mg by mouth every other day. , Disp: , Rfl: 3 .  polyethylene glycol powder (GLYCOLAX/MIRALAX) powder, as needed. , Disp: , Rfl: 0 .  traZODone (DESYREL) 50 MG tablet, Take 0.5-1 tablets (25-50 mg total) by mouth at bedtime as needed for sleep., Disp: , Rfl:  .  Vitamin D, Ergocalciferol, (DRISDOL) 1.25 MG (50000 UT) CAPS capsule, Take 1 capsule (50,000 Units total) by mouth every 30 (thirty) days. (three month supply), Disp: 3 capsule, Rfl: 0  Allergies  Allergen Reactions  . Lisinopril Cough       . Shrimp [Shellfish Allergy] Swelling    throat swells (lobster also)  . Sulfa Antibiotics Swelling    ROS   No other specific complaints in a complete review of systems (except as listed in HPI above).  Objective  Vitals:   09/21/18 0957  BP: 119/82  Pulse: 66   Weight: 206 lb (93.4 kg)  Height: 5\' 6"  (1.676 m)     Body mass index is 33.25 kg/m.  Nursing Note and Vital Signs reviewed.  Physical Exam   Constitutional: Patient appears well-developed and well-nourished. No distress.  HENT: Head: Normocephalic and atraumatic. Throat clear no edema. Patient endorses some TMJ pain on left side- no pain with movement.  Cardiovascular: Normal rate Pulmonary/Chest: Effort normal  Musculoskeletal: Normal range of motion,  Neurological: alert and oriented, speech normal.  Skin: No rash noted. No erythema.  Psychiatric: Patient has a normal mood and affect. behavior  is normal. Judgment and thought content normal.    Assessment & Plan  1. Eustachian tube dysfunction, left Will restart daily clairtin, avoid cigarette smoke, consider steroids if not improving.  - Ambulatory referral to ENT - montelukast (SINGULAIR) 10 MG tablet; Take 1 tablet (10 mg total) by mouth at bedtime.  Dispense: 30 tablet; Refill: 3  2. Seasonal allergies - Ambulatory referral to ENT - montelukast (SINGULAIR) 10 MG tablet; Take 1 tablet (10 mg total) by mouth at bedtime.  Dispense: 30 tablet; Refill: 3    Follow Up Instructions:   PRN  I discussed the assessment and treatment plan with the patient. The patient was provided an opportunity to ask questions and all were answered. The patient agreed with the plan and demonstrated an understanding of the instructions.   The patient was advised to call back or seek an in-person evaluation if the symptoms worsen or if the condition fails to improve as anticipated.  I provided 12 minutes of non-face-to-face time during this encounter.   Fredderick Severance, NP

## 2018-09-21 NOTE — Patient Instructions (Signed)

## 2018-09-30 ENCOUNTER — Ambulatory Visit: Payer: Self-pay | Admitting: Family Medicine

## 2018-09-30 NOTE — Telephone Encounter (Signed)
Pt called in concerned that she has taken her vitamin D wrong.   See notes.  At the request of Dr. Delight Ovens office I'm sending these notes to them.   They are going to call the pt back.   The pt was agreeable to this plan.  She is not having any adverse symptoms.  I sent these notes to [facility].    Reason for Disposition . [1] DOUBLE DOSE (an extra dose or lesser amount) of prescription drug AND [2] NO symptoms (Exception: a double dose of antibiotics)  Answer Assessment - Initial Assessment Questions 1. SYMPTOMS: "Do you have any symptoms?"     Vitamin D prescribed September 04, 2018.    I had this Rx before.   I took it once a week.   Today when I read the label it says every 30 days and I've taken them all once a week.   2. SEVERITY: If symptoms are present, ask "Are they mild, moderate or severe?"     What should I do?    I never looked at the label because I've always taken it once a week.  Protocols used: MEDICATION QUESTION CALL-A-AH

## 2018-10-01 NOTE — Telephone Encounter (Signed)
Patient was informed and will do the Vitamin D 1,000IU daily.

## 2018-10-01 NOTE — Telephone Encounter (Signed)
Her levels were low enough that taking them once a week would not have been harmful to her. If she still has Vitamin D 50,000 IU dose left she can continue it as a monthly dose, if not she can take 1,000IU of vitamin D OTC daily.

## 2018-10-16 ENCOUNTER — Telehealth: Payer: Self-pay

## 2018-10-16 DIAGNOSIS — E785 Hyperlipidemia, unspecified: Secondary | ICD-10-CM

## 2018-10-16 MED ORDER — ATORVASTATIN CALCIUM 20 MG PO TABS: 20.0000 mg | ORAL_TABLET | Freq: Every day | ORAL | 0 refills | Status: DC

## 2018-10-16 NOTE — Telephone Encounter (Signed)
Rx sent to pharmacy   

## 2018-11-30 ENCOUNTER — Other Ambulatory Visit: Payer: Self-pay | Admitting: Family Medicine

## 2019-01-10 ENCOUNTER — Other Ambulatory Visit: Payer: Self-pay | Admitting: Cardiovascular Disease

## 2019-01-10 DIAGNOSIS — E785 Hyperlipidemia, unspecified: Secondary | ICD-10-CM

## 2019-01-23 ENCOUNTER — Other Ambulatory Visit: Payer: Self-pay | Admitting: Cardiovascular Disease

## 2019-01-25 ENCOUNTER — Telehealth: Payer: Self-pay | Admitting: Cardiovascular Disease

## 2019-01-25 ENCOUNTER — Telehealth: Payer: Self-pay

## 2019-01-25 NOTE — Telephone Encounter (Signed)
-----   Message from Anselm Pancoast, [place] sent at 01/25/2019 10:53 AM EDT ----- Please contact patient for a follow up appointment.  Thanks, Ivin Booty

## 2019-01-25 NOTE — Telephone Encounter (Signed)
Patient has been left a voicemail to call and schedule appointment

## 2019-01-25 NOTE — Telephone Encounter (Signed)
Patient needs a follow up appointment for further refills.  Message sent to the scheduling pool.

## 2019-03-03 ENCOUNTER — Telehealth: Payer: Self-pay | Admitting: Cardiovascular Disease

## 2019-03-08 NOTE — Progress Notes (Signed)
Virtual Visit via Video Note   This visit type was conducted due to national recommendations for restrictions regarding the COVID-19 Pandemic (e.g. social distancing) in an effort to limit this patient's exposure and mitigate transmission in our community.  Due to her co-morbid illnesses, this patient is at least at moderate risk for complications without adequate follow up.  This format is felt to be most appropriate for this patient at this time.  All issues noted in this document were discussed and addressed.  A limited physical exam was performed with this format.  Please refer to the patient's chart for her consent to telehealth for [facility].   I connected with  [name] on 03/10/19 by a video enabled telemedicine application and verified that I am speaking with the correct person using two identifiers. I discussed the limitations of evaluation and management by telemedicine. The patient expressed understanding and agreed to proceed.   Evaluation Performed:  Follow-up visit  Date:  03/10/2019   ID:  [name], [name] 10/07/1955, MRN [id number]  Patient Location:  [place] [address]   Provider location:   [facility] [place] office  PCP:  Arnetha Courser, MD  Cardiologist:  Patsy Baltimore   Chief Complaint  Patient presents with  . Other    Patient denies chest pain and SOB at this time. Meds reviewed verbally with patient.     History of Present Illness:    [name] is a 62 y.o. female who presents via audio/video conferencing for a telehealth visit today.   The patient does not symptoms concerning for COVID-19 infection (fever, chills, cough, or new SHORTNESS OF BREATH).   Patient has a past medical history of [name] is a pleasant 63 year old woman with past medical history of pituitary adenoma s/p resection hypertension Nonsmoking Nondiabetic Who presents for follow-up of her labile hypertension  and chest pain  Last seen July 2019 Reported having  episodes of chest pain Blood pressure was running high for several weeks prior to her episodes Was eating out frequently Spikes in blood pressure possibly associated with stress, relationship issues, situational stress We recommended Lasix as needed for leg swelling Moderate salt intake She was on losartan and amlodipine  07/24/2017 ER for chest pain, HTN  Was having some leg swelling Workup essentially negative Hospital records reviewed with the patient in detail May have been going through relationship issues at the time, lots of stress  09/04/2017 Ate peanuts, layed down, developed chest pain Pain continued, BP went high Went to the ER, CXR, EKG, head MRI  No recent trips to the emergency room  Walking, 3 miles a day BP stable Stress, sister with cancer, weak spells Other sister with CVA  Poor sleep this week No leg swelling   Prior CV studies:   The following studies were reviewed today:   Past Medical History:  Diagnosis Date  . Anemia   . Benign hypertensive renal disease   . DDD (degenerative disc disease), lumbar   . Elevated alkaline phosphatase level   . Fibrocystic disease of both breasts   . GERD (gastroesophageal reflux disease)   . Hyperlipidemia   . Hypertension   . Impaired fasting glucose   . Insomnia   . Menopausal state   . Migraine   . Osteoarthritis   . 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 Perforation of nasal septum   . Pre-diabetes   .  Recurrent major depression ([place])   . Sleep apnea    Does not use CPAP  . Spondylosis   . Thrombocytosis ([place]) 10/01/2015   mild  . Wears dentures    full upper   Past Surgical History:  Procedure Laterality Date  . ABDOMINAL HYSTERECTOMY     partial with bladder suspension, no cervix  . CHOLECYSTECTOMY  2003  . COLONOSCOPY  2008   1 polyp- return 10 years  . COLONOSCOPY WITH PROPOFOL N/A 06/30/2017   Procedure: COLONOSCOPY WITH PROPOFOL;   Surgeon: Lucilla Lame, MD;  Location: [place];  Service: Endoscopy;  Laterality: N/A;  slep apnea  . KNEE SURGERY Right 2014  . PITUITARY SURGERY     x2 30+ years ago  . transnasal approach pituitary adenoma resection  2015   clival/parasellar mass    Allergies:   Lisinopril, Shrimp [shellfish allergy], and Sulfa antibiotics   Social History   Tobacco Use  . Smoking status: Passive Smoke Exposure - Never Smoker  . Smokeless tobacco: Never Used  Substance Use Topics  . Alcohol use: No    Comment: rare (1-2 drinks/yr)  . Drug use: No     Current Outpatient Medications on File Prior to Visit  Medication Sig Dispense Refill  . amLODipine (NORVASC) 10 MG tablet TAKE 1 TABLET (10 MG TOTAL) BY MOUTH DAILY. FOR BLOOD PRESSURE 90 tablet 3  . atorvastatin (LIPITOR) 20 MG tablet TAKE 1 TABLET (20 MG TOTAL) BY MOUTH AT BEDTIME. PATIENT NEEDS AN APPOINTMENT FOR FURTHER REFILLS. 90 tablet 0  . escitalopram (LEXAPRO) 10 MG tablet TAKE 1 TABLET BY MOUTH EVERY DAY 90 tablet 1  . fluticasone (FLONASE) 50 MCG/ACT nasal spray PLACE 2 SPRAYS INTO BOTH NOSTRILS AS NEEDED. 16 g 3  . furosemide (LASIX) 20 MG tablet PLEASE SEE ATTACHED FOR DETAILED DIRECTIONS 90 tablet 0  . losartan (COZAAR) 100 MG tablet TAKE 1 TABLET BY MOUTH EVERY DAY 90 tablet 3  . metFORMIN (GLUCOPHAGE) 500 MG tablet Take 500 mg by mouth every other day.   3  . polyethylene glycol powder (GLYCOLAX/MIRALAX) powder as needed.   0  . traZODone (DESYREL) 50 MG tablet Take 0.5-1 tablets (25-50 mg total) by mouth at bedtime as needed for sleep.    . Vitamin D, Ergocalciferol, (DRISDOL) 1.25 MG (50000 UT) CAPS capsule Take 1 capsule (50,000 Units total) by mouth every 30 (thirty) days. (three month supply) 3 capsule 0   No current facility-administered medications on file prior to visit.      Family Hx: The patient's family history includes Alzheimer's disease in her father; Atrial fibrillation in her sister; Breast cancer in  her maternal grandmother; Cancer in her mother, sister, and sister; Diabetes in her mother and sister; Heart attack in her father; Heart disease in her father; Hypertension in her father, mother, and sister; Lymphoma in her sister; Stroke in her sister and sister.  ROS:   Please see the history of present illness.    Review of Systems  Constitutional: Negative.   HENT: Negative.   Respiratory: Negative.   Cardiovascular: Negative.   Gastrointestinal: Negative.   Musculoskeletal: Negative.   Neurological: Negative.   Psychiatric/Behavioral: Negative.   All other systems reviewed and are negative.    Labs/Other Tests and Data Reviewed:    Recent Labs: 06/15/2018: ALT 18; BUN 14; Creat 0.90; Hemoglobin 12.2; Platelets 434; Potassium 4.5; Sodium 141; TSH 0.89   Recent Lipid Panel Lab Results  Component Value Date/Time   CHOL 184 06/15/2018  08:33 AM   CHOL 155 02/06/2016 08:52 AM   TRIG 74 06/15/2018 08:33 AM   HDL 50 06/15/2018 08:33 AM   HDL 48 02/06/2016 08:52 AM   CHOLHDL 3.7 06/15/2018 08:33 AM   LDLCALC 117 (H) 06/15/2018 08:33 AM    Wt Readings from Last 3 Encounters:  03/10/19 198 lb (89.8 kg)  09/21/18 206 lb (93.4 kg)  07/21/18 208 lb (94.3 kg)     Exam:    Vital Signs: Vital signs may also be detailed in the HPI BP 123/80   Pulse 66   Ht 5\' 7"  (1.702 m)   Wt 198 lb (89.8 kg)   BMI 31.01 kg/m   Wt Readings from Last 3 Encounters:  03/10/19 198 lb (89.8 kg)  09/21/18 206 lb (93.4 kg)  07/21/18 208 lb (94.3 kg)   Temp Readings from Last 3 Encounters:  07/21/18 98.3 F (36.8 C) (Oral)  06/15/18 98.4 F (36.9 C)  05/27/18 98.9 F (37.2 C) (Oral)   BP Readings from Last 3 Encounters:  03/10/19 123/80  09/21/18 119/82  07/21/18 124/76   Pulse Readings from Last 3 Encounters:  03/10/19 66  09/21/18 66  07/21/18 73     Well nourished, well developed female in no acute distress. Constitutional:  oriented to person, place, and time. No distress.     ASSESSMENT & PLAN:    Problem List Items Addressed This Visit      Cardiology Problems   Hyperlipidemia - Primary     Other   Impaired fasting glucose   Insomnia   Sleep apnea   Benign hypertensive renal disease     HTN Blood pressure is well controlled on today's visit. No changes made to the medications.  Hyperlipidemia Continue Lipitor 20 daily  Situational stress Taking care of 2 sisters, 1 with recent stroke Other sister recovering from cancer Having some sleepless nights -Recommend she continue her walking program as she is doing Weight is trending downward  Borderline glucose Hemoglobin A1c 6.48 months ago Recommend she continue dietary changes, walking program Weight appears to be trending downward which will help the numbers    COVID-19 Education: The signs and symptoms of COVID-19 were discussed with the patient and how to seek care for testing (follow up with PCP or arrange E-visit).  The importance of social distancing was discussed today.  Patient Risk:   After full review of this patients clinical status, I feel that they are at least moderate risk at this time.  Time:   Today, I have spent 15 minutes with the patient with telehealth technology discussing the cardiac and medical problems/diagnoses detailed above     Medication Adjustments/Labs and Tests Ordered: Current medicines are reviewed at length with the patient today.  Concerns regarding medicines are outlined above.   Tests Ordered: No tests ordered   Medication Changes: No changes made   Disposition: Follow-up in 12 months   Signed, Ida Rogue, MD  [facility] [address] [place] [address], [place], [place] [id number]

## 2019-03-10 ENCOUNTER — Other Ambulatory Visit: Payer: Self-pay

## 2019-03-10 ENCOUNTER — Telehealth (INDEPENDENT_AMBULATORY_CARE_PROVIDER_SITE_OTHER): Payer: 59 | Admitting: Cardiovascular Disease

## 2019-03-10 VITALS — BP 123/80 | HR 66 | Ht 67.0 in | Wt 198.0 lb

## 2019-03-10 DIAGNOSIS — I129 Hypertensive chronic kidney disease with stage 1 through stage 4 chronic kidney disease, or unspecified chronic kidney disease: Secondary | ICD-10-CM | POA: Diagnosis not present

## 2019-03-10 DIAGNOSIS — R7301 Impaired fasting glucose: Secondary | ICD-10-CM | POA: Diagnosis not present

## 2019-03-10 DIAGNOSIS — E785 Hyperlipidemia, unspecified: Secondary | ICD-10-CM | POA: Diagnosis not present

## 2019-03-10 DIAGNOSIS — G4700 Insomnia, unspecified: Secondary | ICD-10-CM | POA: Diagnosis not present

## 2019-03-10 DIAGNOSIS — G4730 Sleep apnea, unspecified: Secondary | ICD-10-CM

## 2019-03-10 NOTE — Telephone Encounter (Signed)
YOUR CARDIOLOGY TEAM HAS ARRANGED FOR AN E-VISIT FOR YOUR APPOINTMENT - PLEASE REVIEW IMPORTANT INFORMATION BELOW SEVERAL DAYS PRIOR TO YOUR APPOINTMENT ° °Due to the recent COVID-19 pandemic, we are transitioning in-person office visits to tele-medicine visits in an effort to decrease unnecessary exposure to our patients, their families, and staff. These visits are billed to your insurance just like a normal visit is. We also encourage you to sign up for MyChart if you have not already done so. You will need a smartphone if possible. For patients that do not have this, we can still complete the visit using a regular telephone but do prefer a smartphone to enable video when possible. You may have a family member that lives with you that can help. If possible, we also ask that you have a blood pressure cuff and scale at home to measure your blood pressure, heart rate and weight prior to your scheduled appointment. Patients with clinical needs that need an in-person evaluation and testing will still be able to come to the office if absolutely necessary. If you have any questions, feel free to call our office. ° ° ° ° °YOUR PROVIDER WILL BE USING THE FOLLOWING PLATFORM TO COMPLETE YOUR VISIT:  ° °• IF USING DOXIMITY or [email or web address] - The staff will give you instructions on receiving your link to join the meeting the day of your visit.  ° ° °2-3 DAYS BEFORE YOUR APPOINTMENT ° °You will receive a telephone call from one of our [facility] team members - your caller ID may say "Unknown caller." If this is a video visit, we will walk you through how to get the video launched on your phone. We will remind you check your blood pressure, heart rate and weight prior to your scheduled appointment. If you have an Apple Watch or Kardia, please upload any pertinent ECG strips the day before or morning of your appointment to MyChart. Our staff will also make sure you have reviewed the consent and agree to move forward with your scheduled  tele-health visit.  ° ° ° °THE DAY OF YOUR APPOINTMENT ° °Approximately 15 minutes prior to your scheduled appointment, you will receive a telephone call from one of [facility] team - your caller ID may say "Unknown caller."  Our staff will confirm medications, vital signs for the day and any symptoms you may be experiencing. Please have this information available prior to the time of visit start. It may also be helpful for you to have a pad of paper and pen handy for any instructions given during your visit. They will also walk you through joining the smartphone meeting if this is a video visit. ° ° ° °CONSENT FOR TELE-HEALTH VISIT - PLEASE REVIEW ° °I hereby voluntarily request, consent and authorize [facility] and its employed or contracted physicians, physician assistants, nurse practitioners or other licensed health care professionals (the Practitioner), to provide me with telemedicine health care services (the “Services") as deemed necessary by the treating Practitioner. I acknowledge and consent to receive the Services by the Practitioner via telemedicine. I understand that the telemedicine visit will involve communicating with the Practitioner through live audiovisual communication technology and the disclosure of certain medical information by electronic transmission. I acknowledge that I have been given the opportunity to request an in-person assessment or other available alternative prior to the telemedicine visit and am voluntarily participating in the telemedicine visit. ° °I understand that I have the right to withhold or withdraw my consent to the use of   telemedicine in the course of my care at any time, without affecting my right to future care or treatment, and that the Practitioner or I may terminate the telemedicine visit at any time. I understand that I have the right to inspect all information obtained and/or recorded in the course of the telemedicine visit and may receive copies of available  information for a reasonable fee.  I understand that some of the potential risks of receiving the Services via telemedicine include:  °• Delay or interruption in medical evaluation due to technological equipment failure or disruption; °• Information transmitted may not be sufficient (e.g. poor resolution of images) to allow for appropriate medical decision making by the Practitioner; and/or  °• In rare instances, security protocols could fail, causing a breach of personal health information. ° °Furthermore, I acknowledge that it is my responsibility to provide information about my medical history, conditions and care that is complete and accurate to the best of my ability. I acknowledge that Practitioner's advice, recommendations, and/or decision may be based on factors not within their control, such as incomplete or inaccurate data provided by me or distortions of diagnostic images or specimens that may result from electronic transmissions. I understand that the practice of medicine is not an exact science and that Practitioner makes no warranties or guarantees regarding treatment outcomes. I acknowledge that I will receive a copy of this consent concurrently upon execution via email to the email address I last provided but may also request a printed copy by calling the office of [facility].   ° °I understand that my insurance will be billed for this visit.  ° °I have read or had this consent read to me. °• I understand the contents of this consent, which adequately explains the benefits and risks of the Services being provided via telemedicine.  °• I have been provided ample opportunity to ask questions regarding this consent and the Services and have had my questions answered to my satisfaction. °• I give my informed consent for the services to be provided through the use of telemedicine in my medical care ° °By participating in this telemedicine visit I agree to the above. °

## 2019-03-10 NOTE — Patient Instructions (Signed)

## 2019-03-16 ENCOUNTER — Other Ambulatory Visit: Payer: Self-pay

## 2019-03-16 ENCOUNTER — Encounter: Payer: Self-pay | Admitting: Family Medicine

## 2019-03-16 ENCOUNTER — Ambulatory Visit (INDEPENDENT_AMBULATORY_CARE_PROVIDER_SITE_OTHER): Payer: 59 | Admitting: Family Medicine

## 2019-03-16 VITALS — BP 140/82 | HR 64 | Temp 97.9°F | Resp 14 | Ht 67.0 in | Wt 201.6 lb

## 2019-03-16 DIAGNOSIS — Z5181 Encounter for therapeutic drug level monitoring: Secondary | ICD-10-CM

## 2019-03-16 DIAGNOSIS — D352 Benign neoplasm of pituitary gland: Secondary | ICD-10-CM

## 2019-03-16 DIAGNOSIS — G4700 Insomnia, unspecified: Secondary | ICD-10-CM

## 2019-03-16 DIAGNOSIS — D473 Essential (hemorrhagic) thrombocythemia: Secondary | ICD-10-CM

## 2019-03-16 DIAGNOSIS — Z130 Encounter for screening for diseases of the blood and blood-forming organs and certain disorders involving the immune mechanism: Secondary | ICD-10-CM

## 2019-03-16 DIAGNOSIS — D75839 Thrombocytosis, unspecified: Secondary | ICD-10-CM

## 2019-03-16 DIAGNOSIS — E559 Vitamin D deficiency, unspecified: Secondary | ICD-10-CM

## 2019-03-16 DIAGNOSIS — Z0000 Encounter for general adult medical examination without abnormal findings: Secondary | ICD-10-CM

## 2019-03-16 DIAGNOSIS — E782 Mixed hyperlipidemia: Secondary | ICD-10-CM

## 2019-03-16 DIAGNOSIS — Z780 Asymptomatic menopausal state: Secondary | ICD-10-CM

## 2019-03-16 DIAGNOSIS — Z1329 Encounter for screening for other suspected endocrine disorder: Secondary | ICD-10-CM

## 2019-03-16 DIAGNOSIS — I10 Essential (primary) hypertension: Secondary | ICD-10-CM

## 2019-03-16 DIAGNOSIS — F3341 Major depressive disorder, recurrent, in partial remission: Secondary | ICD-10-CM

## 2019-03-16 DIAGNOSIS — E119 Type 2 diabetes mellitus without complications: Secondary | ICD-10-CM

## 2019-03-16 DIAGNOSIS — H6982 Other specified disorders of Eustachian tube, left ear: Secondary | ICD-10-CM

## 2019-03-16 DIAGNOSIS — Z1231 Encounter for screening mammogram for malignant neoplasm of breast: Secondary | ICD-10-CM

## 2019-03-16 DIAGNOSIS — H6992 Unspecified Eustachian tube disorder, left ear: Secondary | ICD-10-CM

## 2019-03-16 DIAGNOSIS — Z13228 Encounter for screening for other metabolic disorders: Secondary | ICD-10-CM

## 2019-03-16 DIAGNOSIS — J302 Other seasonal allergic rhinitis: Secondary | ICD-10-CM

## 2019-03-16 MED ORDER — HYDROXYZINE PAMOATE 25 MG PO CAPS: 25.0000 mg | ORAL_CAPSULE | Freq: Three times a day (TID) | ORAL | 1 refills | Status: DC | PRN

## 2019-03-16 NOTE — Patient Instructions (Signed)
Calcium 1200 mg and Vit D 1000 IU - divided and twice a day, or in foods, with weight bearing exercises help strengthen bones.   Preventing Osteoporosis, Adult Osteoporosis is a condition that causes the bones to get weaker. With osteoporosis, the bones become thinner, and the normal spaces in bone tissue become larger. This can make the bones weak and cause them to break more easily. People who have osteoporosis are more likely to break their wrist, spine, or hip. Even a minor accident or injury can be enough to break weak bones. Osteoporosis can occur with aging. Your body constantly replaces old bone tissue with new tissue. As you get older, you may lose bone tissue more quickly, or it may be replaced more slowly. Osteoporosis is more likely to develop if you have poor nutrition or do not get enough calcium or vitamin D. Other lifestyle factors can also play a role. By making some diet and lifestyle changes, you can help to keep your bones healthy and help to prevent osteoporosis. What nutrition changes can be made? Nutrition plays an important role in maintaining healthy, strong bones.  Make sure you get enough calcium every day from food or from calcium supplements. ? If you are age 90 or younger, aim to get 1,000 mg of calcium every day. ? If you are older than age 36, aim to get 1,200 mg of calcium every day.  Try to get enough vitamin D every day. ? If you are age 65 or younger, aim to get 600 international units (IU) every day. ? If you are older than age 39, aim to get 800 international units every day.  Follow a healthy diet. Eat plenty of foods that contain calcium and vitamin D. ? Calcium is in milk, cheese, yogurt, and other dairy products. Some fish and vegetables are also good sources of calcium. Many foods such as cereals and breads have had calcium added to them (are fortified). Check nutrition labels to see how much calcium is in a food or drink. ? Foods that contain vitamin D  include milk, cereals, salmon, and tuna. Your body also makes vitamin D when you are out in the sun. Bare skin exposure to the sun on your face, arms, legs, or back for no more than 30 minutes a day, 2 times per week is more than enough. Beyond that, it is important to use sunblock to protect your skin from sunburn, which increases your risk for skin cancer. What lifestyle changes can be made? Making changes in your everyday life can also play an important role in preventing osteoporosis.  Stay active and get exercise every day. Ask your health care provider what types of exercise are best for you.  Do not use any products that contain nicotine or tobacco, such as cigarettes and e-cigarettes. If you need help quitting, ask your health care provider.  Limit alcohol intake to no more than 1 drink a day for nonpregnant women and 2 drinks a day for men. One drink equals 12 oz of beer, 5 oz of wine, or 1 oz of hard liquor. Why are these changes important? Making these nutrition and lifestyle changes can:  Help you develop and maintain healthy, strong bones.  Prevent loss of bone mass and the problems that are caused by that loss, such as broken bones and delayed healing.  Make you feel better mentally and physically. What can happen if changes are not made? Problems that can result from osteoporosis can be very serious.  These may include:  A higher risk of broken bones that are painful and do not heal well.  Physical malformations, such as a collapsed spine or a hunched back.  Problems with movement. Where to find support If you need help making changes to prevent osteoporosis, talk with your health care provider. You can ask for a referral to a diet and nutrition specialist (dietitian) and a physical therapist. Where to find more information Learn more about osteoporosis from:  NIH Osteoporosis and Related [place]:  www.niams.GolfingGoddess.com.br  [place] Office on Women's Health: SouvenirBaseball.es.html  National Osteoporosis Foundation: ProfilePeek.ch Summary  Osteoporosis is a condition that causes weak bones that are more likely to break.  Eating a healthy diet and making sure you get enough calcium and vitamin D can help prevent osteoporosis.  Other ways to reduce your risk of osteoporosis include getting regular exercise and avoiding alcohol and products that contain nicotine or tobacco. This information is not intended to replace advice given to you by your health care provider. Make sure you discuss any questions you have with your health care provider. Document Released: 05/14/2015 Document Revised: 04/11/2017 Document Reviewed: 01/08/2016 Elsevier Patient Education  2020 [employer]   Preventive Care 17-59 Years Old, Female Preventive care refers to visits with your health care provider and lifestyle choices that can promote health and wellness. This includes:  A yearly physical exam. This may also be called an annual well check.  Regular dental visits and eye exams.  Immunizations.  Screening for certain conditions.  Healthy lifestyle choices, such as eating a healthy diet, getting regular exercise, not using drugs or products that contain nicotine and tobacco, and limiting alcohol use. What can I expect for my preventive care visit? Physical exam Your health care provider will check your:  Height and weight. This may be used to calculate body mass index (BMI), which tells if you are at a healthy weight.  Heart rate and blood pressure.  Skin for abnormal spots. Counseling Your health care provider may ask you questions about your:  Alcohol, tobacco, and drug use.  Emotional well-being.  Home and relationship well-being.  Sexual activity.  Eating  habits.  Work and work Statistician.  Method of birth control.  Menstrual cycle.  Pregnancy history. What immunizations do I need?  Influenza (flu) vaccine  This is recommended every year. Tetanus, diphtheria, and pertussis (Tdap) vaccine  You may need a Td booster every 10 years. Varicella (chickenpox) vaccine  You may need this if you have not been vaccinated. Zoster (shingles) vaccine  You may need this after age 92. Measles, mumps, and rubella (MMR) vaccine  You may need at least one dose of MMR if you were born in 1957 or later. You may also need a second dose. Pneumococcal conjugate (PCV13) vaccine  You may need this if you have certain conditions and were not previously vaccinated. Pneumococcal polysaccharide (PPSV23) vaccine  You may need one or two doses if you smoke cigarettes or if you have certain conditions. Meningococcal conjugate (MenACWY) vaccine  You may need this if you have certain conditions. Hepatitis A vaccine  You may need this if you have certain conditions or if you travel or work in places where you may be exposed to hepatitis A. Hepatitis B vaccine  You may need this if you have certain conditions or if you travel or work in places where you may be exposed to hepatitis B. Haemophilus influenzae type b (Hib) vaccine  You may  need this if you have certain conditions. Human papillomavirus (HPV) vaccine  If recommended by your health care provider, you may need three doses over 6 months. You may receive vaccines as individual doses or as more than one vaccine together in one shot (combination vaccines). Talk with your health care provider about the risks and benefits of combination vaccines. What tests do I need? Blood tests  Lipid and cholesterol levels. These may be checked every 5 years, or more frequently if you are over 79 years old.  Hepatitis C test.  Hepatitis B test. Screening  Lung cancer screening. You may have this screening  every year starting at age 33 if you have a 30-pack-year history of smoking and currently smoke or have quit within the past 15 years.  Colorectal cancer screening. All adults should have this screening starting at age 41 and continuing until age 58. Your health care provider may recommend screening at age 30 if you are at increased risk. You will have tests every 1-10 years, depending on your results and the type of screening test.  Diabetes screening. This is done by checking your blood sugar (glucose) after you have not eaten for a while (fasting). You may have this done every 1-3 years.  Mammogram. This may be done every 1-2 years. Talk with your health care provider about when you should start having regular mammograms. This may depend on whether you have a family history of breast cancer.  BRCA-related cancer screening. This may be done if you have a family history of breast, ovarian, tubal, or peritoneal cancers.  Pelvic exam and Pap test. This may be done every 3 years starting at age 19. Starting at age 54, this may be done every 5 years if you have a Pap test in combination with an HPV test. Other tests  Sexually transmitted disease (STD) testing.  Bone density scan. This is done to screen for osteoporosis. You may have this scan if you are at high risk for osteoporosis. Follow these instructions at home: Eating and drinking  Eat a diet that includes fresh fruits and vegetables, whole grains, lean protein, and low-fat dairy.  Take vitamin and mineral supplements as recommended by your health care provider.  Do not drink alcohol if: ? Your health care provider tells you not to drink. ? You are pregnant, may be pregnant, or are planning to become pregnant.  If you drink alcohol: ? Limit how much you have to 0-1 drink a day. ? Be aware of how much alcohol is in your drink. In the [place] one drink equals one 12 oz bottle of beer (355 mL), one 5 oz glass of wine (148 mL), or one 1  oz glass of hard liquor (44 mL). Lifestyle  Take daily care of your teeth and gums.  Stay active. Exercise for at least 30 minutes on 5 or more days each week.  Do not use any products that contain nicotine or tobacco, such as cigarettes, e-cigarettes, and chewing tobacco. If you need help quitting, ask your health care provider.  If you are sexually active, practice safe sex. Use a condom or other form of birth control (contraception) in order to prevent pregnancy and STIs (sexually transmitted infections).  If told by your health care provider, take low-dose aspirin daily starting at age 24. What's next?  Visit your health care provider once a year for a well check visit.  Ask your health care provider how often you should have your eyes and teeth  checked.  Stay up to date on all vaccines. This information is not intended to replace advice given to you by your health care provider. Make sure you discuss any questions you have with your health care provider. Document Released: 05/26/2015 Document Revised: 01/08/2018 Document Reviewed: 01/08/2018 Elsevier Patient Education  2020 [employer].   Insomnia Insomnia is a sleep disorder that makes it difficult to fall asleep or stay asleep. Insomnia can cause fatigue, low energy, difficulty concentrating, mood swings, and poor performance at work or school. There are three different ways to classify insomnia:  Difficulty falling asleep.  Difficulty staying asleep.  Waking up too early in the morning. Any type of insomnia can be long-term (chronic) or short-term (acute). Both are common. Short-term insomnia usually lasts for three months or less. Chronic insomnia occurs at least three times a week for longer than three months. What are the causes? Insomnia may be caused by another condition, situation, or substance, such as:  Anxiety.  Certain medicines.  Gastroesophageal reflux disease (GERD) or other gastrointestinal conditions.   Asthma or other breathing conditions.  Restless legs syndrome, sleep apnea, or other sleep disorders.  Chronic pain.  Menopause.  Stroke.  Abuse of alcohol, tobacco, or illegal drugs.  Mental health conditions, such as depression.  Caffeine.  Neurological disorders, such as Alzheimer's disease.  An overactive thyroid (hyperthyroidism). Sometimes, the cause of insomnia may not be known. What increases the risk? Risk factors for insomnia include:  Gender. Women are affected more often than men.  Age. Insomnia is more common as you get older.  Stress.  Lack of exercise.  Irregular work schedule or working night shifts.  Traveling between different time zones.  Certain medical and mental health conditions. What are the signs or symptoms? If you have insomnia, the main symptom is having trouble falling asleep or having trouble staying asleep. This may lead to other symptoms, such as:  Feeling fatigued or having low energy.  Feeling nervous about going to sleep.  Not feeling rested in the morning.  Having trouble concentrating.  Feeling irritable, anxious, or depressed. How is this diagnosed? This condition may be diagnosed based on:  Your symptoms and medical history. Your health care provider may ask about: ? Your sleep habits. ? Any medical conditions you have. ? Your mental health.  A physical exam. How is this treated? Treatment for insomnia depends on the cause. Treatment may focus on treating an underlying condition that is causing insomnia. Treatment may also include:  Medicines to help you sleep.  Counseling or therapy.  Lifestyle adjustments to help you sleep better. Follow these instructions at home: Eating and drinking   Limit or avoid alcohol, caffeinated beverages, and cigarettes, especially close to bedtime. These can disrupt your sleep.  Do not eat a large meal or eat spicy foods right before bedtime. This can lead to digestive  discomfort that can make it hard for you to sleep. Sleep habits   Keep a sleep diary to help you and your health care provider figure out what could be causing your insomnia. Write down: ? When you sleep. ? When you wake up during the night. ? How well you sleep. ? How rested you feel the next day. ? Any side effects of medicines you are taking. ? What you eat and drink.  Make your bedroom a dark, comfortable place where it is easy to fall asleep. ? Put up shades or blackout curtains to block light from outside. ? Use a white  noise machine to block noise. ? Keep the temperature cool.  Limit screen use before bedtime. This includes: ? Watching TV. ? Using your smartphone, tablet, or computer.  Stick to a routine that includes going to bed and waking up at the same times every day and night. This can help you fall asleep faster. Consider making a quiet activity, such as reading, part of your nighttime routine.  Try to avoid taking naps during the day so that you sleep better at night.  Get out of bed if you are still awake after 15 minutes of trying to sleep. Keep the lights down, but try reading or doing a quiet activity. When you feel sleepy, go back to bed. General instructions  Take over-the-counter and prescription medicines only as told by your health care provider.  Exercise regularly, as told by your health care provider. Avoid exercise starting several hours before bedtime.  Use relaxation techniques to manage stress. Ask your health care provider to suggest some techniques that may work well for you. These may include: ? Breathing exercises. ? Routines to release muscle tension. ? Visualizing peaceful scenes.  Make sure that you drive carefully. Avoid driving if you feel very sleepy.  Keep all follow-up visits as told by your health care provider. This is important. Contact a health care provider if:  You are tired throughout the day.  You have trouble in your  daily routine due to sleepiness.  You continue to have sleep problems, or your sleep problems get worse. Get help right away if:  You have serious thoughts about hurting yourself or someone else. If you ever feel like you may hurt yourself or others, or have thoughts about taking your own life, get help right away. You can go to your nearest emergency department or call:  Your local emergency services (911 in the [place]  A suicide crisis helpline, such as the [place] at 251-484-0016. This is open 24 hours a day. Summary  Insomnia is a sleep disorder that makes it difficult to fall asleep or stay asleep.  Insomnia can be long-term (chronic) or short-term (acute).  Treatment for insomnia depends on the cause. Treatment may focus on treating an underlying condition that is causing insomnia.  Keep a sleep diary to help you and your health care provider figure out what could be causing your insomnia. This information is not intended to replace advice given to you by your health care provider. Make sure you discuss any questions you have with your health care provider. Document Released: 04/26/2000 Document Revised: 04/11/2017 Document Reviewed: 02/06/2017 Elsevier Patient Education  2020 [employer].

## 2019-03-16 NOTE — Progress Notes (Signed)
Patient: [name], Female    DOB: 10-07-1955, 63 y.o.   MRN: [id number] Delsa Grana, PA-C Visit Date: 03/16/2019  Today's Provider: Delsa Grana, PA-C   Chief Complaint  Patient presents with   Annual Exam   Subjective:   Annual physical exam:  [name] is a 63 y.o. 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  She has trouble getting to sleep most nights and gets up frequently due to caring for her sisters - they both have had falls in the middle of the night, her sister with a stroke has labile BP that is difficult to manage.  Pt needs to do routine f/up on chronic conditions today in addition to CPE. Advised pt of separate visit billing/coding  Diabetes - last A1C was 6.5 - on chart is noted as dx of impaired fasting glucose, she was on Metformin for a short period of time, had GI SE, not taking any medications now, but has been working on diet and exercise last took in 2018.  Has been loosing a little weight.  HTN - losartan and norvasc and lasix prn, BP a little up today, pt rushing and did not take meds this am, she does see cardiology.  Her blood pressure has been well controlled on losartan and Norvasc.  She is careful with what she eats.  She sometimes has some dependent edema and uses the diuretic occasionally but not daily.  She denies any chest pain, palpitations, lightheadedness, syncope, near syncope, shortness of breath.  HLD lipitor tolerating well, trying to avoid high fat foods.  Last lipid panel had LDL 117.  She denies chest pain, myalgias  2015 pituitary adenoma last checked on by the eye dr., no visual disturbance or  concerns, there was no f/up advised at that time  She also asked if I can recheck her ears.  She has dealt with allergies and ear pain and has diagnosis of seasonal allergies and eustachian tube dysfunction.  She has seen ENT but she did not go back to them for her follow-up so it is been a while.  Overall her allergies and her ear symptoms feel better.  She has no otalgia or decreased hearing.  She has no congestion, postnasal drip.    USPSTF grade A and B recommendations - reviewed and addressed today  Depression:  Phq 9 completed today by patient, was reviewed by me with patient in the room, score is  positive, pt feels over all good, but tired as a caretaker PHQ 2/9 Scores 03/16/2019 09/21/2018 08/26/2018 07/21/2018  PHQ - 2 Score 1 0 2 0  PHQ- 9 Score 4 0 2 0   Depression screen [facility] 2/9 03/16/2019 09/21/2018 08/26/2018 07/21/2018 06/15/2018  Decreased Interest 0 0 1 0 3  Down, Depressed, Hopeless 1 0 1 0 3  PHQ - 2 Score 1 0 2 0 6  Altered sleeping 3 0 0 0 3  Tired, decreased energy 0 0 0 0 3  Change in appetite 0 0 0 0 0  Feeling bad or failure about yourself  0 0 0 0 0  Trouble concentrating 0 0 0 0 0  Moving slowly or fidgety/restless 0 0 0 0 0  Suicidal thoughts 0 0 0 0 0  PHQ-9 Score 4 0 2 0 12  Difficult doing work/chores Not difficult at all Not difficult at all Not difficult at all Not difficult at all Somewhat difficult  Some recent data might be hidden   Alcohol screening:   Office Visit from 03/16/2019 in [facility]  AUDIT-C Score  0     Immunizations and Health Maintenance: Health Maintenance  Topic Date Due   MAMMOGRAM  03/31/2018   INFLUENZA VACCINE  08/11/2019 (Originally 12/12/2018)   TETANUS/TDAP  03/27/2025   COLONOSCOPY  07/01/2027   Hepatitis C Screening  Completed   HIV Screening  Addressed   PAP SMEAR-Modifier  Discontinued     Hep C Screening: done  STD testing and prevention (HIV/chl/gon/syphilis):    Intimate partner  violence: safe  Sexual History/Pain during Intercourse:  Not sexually active Divorced  Menstrual History/LMP/Abnormal Bleeding: none No LMP recorded. Patient has had a hysterectomy.  Incontinence Symptoms: none  Breast cancer:  Last Mammogram: due, ordered BRCA gene screening:   Cervical cancer screening: not indicated s/p hysterectomy Family hx of cancers - breast, ovarian, uterine, colon:  Maternal grandmother breast CA, none in mother or sisters, sister with lymphoma  Osteoporosis:   Discussed high calcium and vitamin D supplementation, weight bearing exercises Pt is  supplementing with daily Vit D. No recent Bone scan/dexa- she says one was done a long time ago  Skin cancer:  Hx of skin CA - no Discussed atypical lesions   Colorectal cancer:   colonoscopy is UTD  Lung cancer:   Low Dose CT Chest recommended if Age 39-80 years, 30 pack-year currently smoking OR have quit w/in 15years. Patient does not qualify.   Social History   Tobacco Use   Smoking status: Passive Smoke Exposure - Never Smoker   Smokeless tobacco: Never Used  Substance Use Topics   Alcohol use: No    Comment: rare (1-2 drinks/yr)     ECG: none done today  Blood pressure/Hypertension: BP Readings from Last 3 Encounters:  03/16/19 140/82  03/10/19 123/80  09/21/18 119/82    Weight/Obesity: Wt Readings from Last 3 Encounters:  03/16/19 201 lb 9.6 oz (91.4 kg)  03/10/19 198 lb (89.8 kg)  09/21/18 206 lb (93.4 kg)   BMI Readings from Last 3 Encounters:  03/16/19 31.58 kg/m  03/10/19 31.01 kg/m  09/21/18 33.25 kg/m     Lipids:  Lab Results  Component Value Date   CHOL 184 06/15/2018   CHOL 127 12/11/2017   CHOL 171 04/30/2017   Lab Results  Component Value Date   HDL 50 06/15/2018   HDL 47 (L) 12/11/2017   HDL 54 04/30/2017   Lab Results  Component Value Date   LDLCALC 117 (H) 06/15/2018   LDLCALC 64 12/11/2017   LDLCALC 98 04/30/2017   Lab Results  Component Value  Date   TRIG 74 06/15/2018   TRIG 79 12/11/2017   TRIG 95 04/30/2017   Lab Results  Component Value Date   CHOLHDL 3.7 06/15/2018   CHOLHDL 2.7 12/11/2017   CHOLHDL 3.2 04/30/2017   No results found for: LDLDIRECT Based on the results of lipid panel his/her cardiovascular risk factor ( using [place] )  in the next 10 years is: The 10-year ASCVD risk score Mikey Bussing DC Jr., et al., 2013) is: 22.8%   Values used to calculate the score:     Age: 71  years     Sex: Female     Is Non-Hispanic African American: Yes     Diabetic: Yes     Tobacco smoker: No     Systolic Blood Pressure: 284 mmHg     Is BP treated: Yes     HDL Cholesterol: 50 mg/dL     Total Cholesterol: 184 mg/dL  Glucose:  Glucose, Bld  Date Value Ref Range Status  06/15/2018 122 (H) 65 - 99 mg/dL Final    Comment:    .            Fasting reference interval . For someone without known diabetes, a glucose value between 100 and 125 mg/dL is consistent with prediabetes and should be confirmed with a follow-up test. .   12/11/2017 110 (H) 65 - 99 mg/dL Final    Comment:    .            Fasting reference interval . For someone without known diabetes, a glucose value between 100 and 125 mg/dL is consistent with prediabetes and should be confirmed with a follow-up test. .   08/15/2017 120 65 - 139 mg/dL Final    Comment:    .        Non-fasting reference interval .       [facility] Visit from 03/16/2019 in [facility]  AUDIT-C Score  0      Depression screen [facility] 2/9 03/16/2019 09/21/2018 08/26/2018 07/21/2018 06/15/2018  Decreased Interest 0 0 1 0 3  Down, Depressed, Hopeless 1 0 1 0 3  PHQ - 2 Score 1 0 2 0 6  Altered sleeping 3 0 0 0 3  Tired, decreased energy 0 0 0 0 3  Change in appetite 0 0 0 0 0  Feeling bad or failure about yourself  0 0 0 0 0  Trouble concentrating 0 0 0 0 0  Moving slowly or fidgety/restless 0 0 0 0 0  Suicidal thoughts 0 0 0 0 0  PHQ-9 Score 4 0 2 0 12    Difficult doing work/chores Not difficult at all Not difficult at all Not difficult at all Not difficult at all Somewhat difficult  Some recent data might be hidden   Hypertension: BP Readings from Last 3 Encounters:  03/16/19 140/82  03/10/19 123/80  09/21/18 119/82   Obesity: Wt Readings from Last 3 Encounters:  03/16/19 201 lb 9.6 oz (91.4 kg)  03/10/19 198 lb (89.8 kg)  09/21/18 206 lb (93.4 kg)   BMI Readings from Last 3 Encounters:  03/16/19 31.58 kg/m  03/10/19 31.01 kg/m  09/21/18 33.25 kg/m      Advanced Care Planning:  Did not discuss today - per designated emergency contact  Social History      She  reports that she is a non-smoker but has been exposed to tobacco smoke. She has never used smokeless tobacco. She reports that she does not drink alcohol or use drugs.       Social History   Socioeconomic History   Marital status: Divorced    Spouse name: Not on file   Number of children: 2   Years of education: Not on file   Highest education level: High school graduate  Occupational History   Occupation: retired  Scientist, product/process development strain: Somewhat hard   Food insecurity    Worry: Never true    Inability: Never true   Transportation needs    Medical: No  Non-medical: No  Tobacco Use   Smoking status: Passive Smoke Exposure - Never Smoker   Smokeless tobacco: Never Used  Substance and Sexual Activity   Alcohol use: No    Comment: rare (1-2 drinks/yr)   Drug use: No   Sexual activity: Not Currently    Birth control/protection: Surgical  Lifestyle   Physical activity    Days per week: 3 days    Minutes per session: 30 min   Stress: Very much  Relationships   Social connections    Talks on phone: More than three times a week    Gets together: Once a week    Attends religious service: More than 4 times per year    Active member of club or organization: Yes    Attends meetings of clubs or organizations: 1 to 4  times per year    Relationship status: Divorced  Other Topics Concern   Not on file  Social History Narrative   Not on file    Family History        Family Status  Relation Name Status   Mother  Deceased       cancer (possible liver)   Father  Deceased       heart attack   Sister  Deceased   MGM  Deceased   Sister  Alive   Son Parc Alive   Son Carlo Alive   Sister  Alive   Sister  Alive   Sister  Alive        Her family history includes Alzheimer's disease in her father; Atrial fibrillation in her sister; Breast cancer in her maternal grandmother; Cancer in her mother, sister, and sister; Diabetes in her mother and sister; Heart attack in her father; Heart disease in her father; Hypertension in her father, mother, and sister; Lymphoma in her sister; Stroke in her sister and sister.       Family History  Problem Relation Age of Onset   Diabetes Mother    Hypertension Mother    Cancer Mother        possible liver   Heart disease Father    Hypertension Father    Heart attack Father    Alzheimer's disease Father    Diabetes Sister    Hypertension Sister    Cancer Sister        lung?   Stroke Sister    Atrial fibrillation Sister    Breast cancer Maternal Grandmother    Cancer Sister    Lymphoma Sister    Stroke Sister     Patient Active Problem List   Diagnosis Date Noted   Hiatal hernia 11/10/2017   Hypertension 06/13/2017   Dysfunction of left eustachian tube 10/01/2015   Vitamin D deficiency 10/01/2015   Thrombocytosis ([place]) 10/01/2015   Prediabetes 09/27/2015   Perforated nasal septum 08/25/2015   GERD (gastroesophageal reflux disease)    Osteoarthritis    Hyperlipidemia    Sleep apnea    DDD (degenerative disc disease), lumbar    Insomnia    Recurrent major depression ([place])    Migraine    Pituitary adenoma ([place]) 11/12/2013   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    Past Surgical History:  Procedure Laterality  Date   ABDOMINAL HYSTERECTOMY     partial with bladder suspension, no cervix   CHOLECYSTECTOMY  2003   COLONOSCOPY  2008   1 polyp- return 10 years   COLONOSCOPY WITH PROPOFOL N/A 06/30/2017   Procedure: COLONOSCOPY WITH PROPOFOL;  Surgeon: Lucilla Lame,  MD;  Location: [facility];  Service: Endoscopy;  Laterality: N/A;  slep apnea   KNEE SURGERY Right 2014   PITUITARY SURGERY     x2 30+ years ago   transnasal approach pituitary adenoma resection  2015   clival/parasellar mass     Current Outpatient Medications:    amLODipine (NORVASC) 10 MG tablet, TAKE 1 TABLET (10 MG TOTAL) BY MOUTH DAILY. FOR BLOOD PRESSURE, Disp: 90 tablet, Rfl: 3   atorvastatin (LIPITOR) 20 MG tablet, TAKE 1 TABLET (20 MG TOTAL) BY MOUTH AT BEDTIME. PATIENT NEEDS AN APPOINTMENT FOR FURTHER REFILLS., Disp: 90 tablet, Rfl: 0   cholecalciferol (VITAMIN D3) 25 MCG (1000 UT) tablet, Take 1,000 Units by mouth daily., Disp: , Rfl:    escitalopram (LEXAPRO) 10 MG tablet, TAKE 1 TABLET BY MOUTH EVERY DAY, Disp: 90 tablet, Rfl: 1   fluticasone (FLONASE) 50 MCG/ACT nasal spray, PLACE 2 SPRAYS INTO BOTH NOSTRILS AS NEEDED., Disp: 16 g, Rfl: 3   furosemide (LASIX) 20 MG tablet, PLEASE SEE ATTACHED FOR DETAILED DIRECTIONS, Disp: 90 tablet, Rfl: 0   losartan (COZAAR) 100 MG tablet, TAKE 1 TABLET BY MOUTH EVERY DAY, Disp: 90 tablet, Rfl: 3   metFORMIN (GLUCOPHAGE) 500 MG tablet, Take 500 mg by mouth every other day. , Disp: , Rfl: 3   polyethylene glycol powder (GLYCOLAX/MIRALAX) powder, as needed. , Disp: , Rfl: 0   traZODone (DESYREL) 50 MG tablet, Take 0.5-1 tablets (25-50 mg total) by mouth at bedtime as needed for sleep., Disp: , Rfl:    hydrOXYzine (VISTARIL) 25 MG capsule, Take 1-2 capsules (25-50 mg total) by mouth 3 (three) times daily as needed for anxiety (one hour before bedtime prn for insomnia)., Disp: 30 capsule, Rfl: 1  Allergies  Allergen Reactions   Lisinopril Cough        Shrimp  [Shellfish Allergy] Swelling    throat swells (lobster also)   Sulfa Antibiotics Swelling   Personally reviewed patient's medication list, allergies, past medical history, active diagnoses, recent labs, recent encounters.  Patient Care Team: Delsa Grana, PA-C as PCP - General (Family Medicine)  Review of Systems  Constitutional: Negative.  Negative for activity change, appetite change, fatigue and unexpected weight change.  HENT: Negative.   Eyes: Negative.   Respiratory: Negative.  Negative for cough, choking, chest tightness and shortness of breath.   Cardiovascular: Negative.  Negative for chest pain, palpitations and leg swelling.  Gastrointestinal: Negative.  Negative for abdominal pain and blood in stool.  Endocrine: Negative.   Genitourinary: Negative.   Musculoskeletal: Negative.  Negative for arthralgias, gait problem, joint swelling and myalgias.  Skin: Negative.  Negative for color change, pallor and rash.  Allergic/Immunologic: Negative.   Neurological: Negative.  Negative for syncope and weakness.  Hematological: Negative.   Psychiatric/Behavioral: Positive for sleep disturbance. Negative for agitation, behavioral problems, confusion, decreased concentration, dysphoric mood, self-injury and suicidal ideas. The patient is not nervous/anxious.   All other systems reviewed and are negative.           Objective:   Vitals:  Vitals:   03/16/19 0808  BP: 140/82  Pulse: 64  Resp: 14  Temp: 97.9 F (36.6 C)  SpO2: 99%  Weight: 201 lb 9.6 oz (91.4 kg)  Height: _0  (1.702 m)    Body mass index is 31.58 kg/m.  Physical Exam Vitals signs and nursing note reviewed.  Constitutional:      General: She is not in acute distress.    Appearance: Normal appearance.  She is well-developed. She is not ill-appearing, toxic-appearing or diaphoretic.     Interventions: Face mask in place.  HENT:     Head: Normocephalic and atraumatic.     Right Ear: Ear canal and  external ear normal.     Left Ear: Ear canal and external ear normal.     Ears:     Comments: B/L TM with visible landmarks and cone of light, some scarring b/l    Nose: Congestion present.     Mouth/Throat:     Mouth: Mucous membranes are moist.     Pharynx: Oropharynx is clear. No oropharyngeal exudate or posterior oropharyngeal erythema.  Eyes:     General: Lids are normal. No scleral icterus.       Right eye: No discharge.        Left eye: No discharge.     Conjunctiva/sclera: Conjunctivae normal.  Neck:     Musculoskeletal: Normal range of motion and neck supple.     Thyroid: No thyroid mass, thyromegaly or thyroid tenderness.     Trachea: Phonation normal. No tracheal deviation.  Cardiovascular:     Rate and Rhythm: Normal rate and regular rhythm.     Pulses: Normal pulses.          Radial pulses are 2+ on the right side and 2+ on the left side.       Posterior tibial pulses are 2+ on the right side and 2+ on the left side.     Heart sounds: Normal heart sounds. No murmur. No friction rub. No gallop.   Pulmonary:     Effort: Pulmonary effort is normal. No respiratory distress.     Breath sounds: Normal breath sounds. No stridor. No wheezing, rhonchi or rales.  Chest:     Chest wall: No tenderness.  Abdominal:     General: Bowel sounds are normal. There is no distension.     Palpations: Abdomen is soft.     Tenderness: There is no abdominal tenderness. There is no guarding or rebound.  Musculoskeletal: Normal range of motion.        General: No deformity.     Right lower leg: No edema.     Left lower leg: No edema.  Lymphadenopathy:     Cervical: No cervical adenopathy.  Skin:    General: Skin is warm and dry.     Capillary Refill: Capillary refill takes less than 2 seconds.     Coloration: Skin is not jaundiced or pale.     Findings: No rash.  Neurological:     Mental Status: She is alert and oriented to person, place, and time.     Motor: No abnormal muscle tone.       Gait: Gait normal.  Psychiatric:        Speech: Speech normal.        Behavior: Behavior normal.       Fall Risk: Fall Risk  03/16/2019 09/21/2018 08/26/2018 07/21/2018 06/15/2018  Falls in the past year? 0 0 0 0 0  Number falls in past yr: 0 - 0 0 -  Injury with Fall? 0 - 0 0 -  Follow up - - Falls evaluation completed - -    Functional Status Survey: Is the patient deaf or have difficulty hearing?: No Does the patient have difficulty seeing, even when wearing glasses/contacts?: No Does the patient have difficulty concentrating, remembering, or making decisions?: No Does the patient have difficulty walking or climbing stairs?: No Does the patient  have difficulty dressing or bathing?: No Does the patient have difficulty doing errands alone such as visiting a doctor's office or shopping?: No   Assessment & Plan:    CPE completed today   USPSTF grade A and B recommendations reviewed with patient; age-appropriate recommendations, preventive care, screening tests, etc discussed and encouraged; healthy living encouraged; see AVS for patient education given to patient   Discussed importance of 150 minutes of physical activity weekly, AHA exercise recommendations given to pt in AVS/handout   Discussed importance of healthy diet:  eating lean meats and proteins, avoiding trans fats and saturated fats, avoid simple sugars and excessive carbs in diet, eat 6 servings of fruit/vegetables daily and drink plenty of water and avoid sweet beverages.     Recommended pt to do annual eye exam and routine dental exams/cleanings   Depression, alcohol, fall screening completed as documented above and per flowsheets   Reviewed Health Maintenance: Health Maintenance  Topic Date Due   MAMMOGRAM  03/31/2018   INFLUENZA VACCINE  08/11/2019 (Originally 12/12/2018)   TETANUS/TDAP  03/27/2025   COLONOSCOPY  07/01/2027   Hepatitis C Screening  Completed   HIV Screening  Addressed   PAP  SMEAR-Modifier  Discontinued     Immunizations: Immunization History  Administered Date(s) Administered   Hep A / Hep B 04/14/2018, 05/22/2018   Tdap 03/28/2015    1. Adult general medical exam Done today - CBC with Differential/Platelet - COMPLETE METABOLIC PANEL WITH GFR - Lipid panel - Hemoglobin A1c - VITAMIN D 25 Hydroxy (Vit-D Deficiency, Fractures) - DG Bone Density; Future  2. Thrombocytosis (HCC) Recheck platelets - CBC with Differential/Platelet  3. Type 2 diabetes mellitus without complication, without long-term current use of insulin (HCC) Last A1C at goal, recheck, tolerating metformin - COMPLETE METABOLIC PANEL WITH GFR - Lipid panel - Hemoglobin A1c  4. Vitamin D deficiency Last labs low, recheck - VITAMIN D 25 Hydroxy (Vit-D Deficiency, Fractures) - DG Bone Density; Future  5. Recurrent major depressive disorder, in partial remission ([place]) Well controlled with meds, still having some stress in life with being a caretaker for 2 sisters, but is making time for herself and exercising each day and walking with a friend.    6. Essential hypertension Well controlled today - considering she hasn't taken her meds and she was a little rushed and anxious - COMPLETE METABOLIC PANEL WITH GFR  7. Insomnia, unspecified type Trial of vistaril - hydrOXYzine (VISTARIL) 25 MG capsule; Take 1-2 capsules (25-50 mg total) by mouth 3 (three) times daily as needed for anxiety (one hour before bedtime prn for insomnia).  Dispense: 30 capsule; Refill: 1  8. Encounter for medication monitoring - CBC with Differential/Platelet - COMPLETE METABOLIC PANEL WITH GFR - Lipid panel - Hemoglobin A1c - VITAMIN D 25 Hydroxy (Vit-D Deficiency, Fractures) - TSH  9. Mixed hyperlipidemia Tolerating lipitor - COMPLETE METABOLIC PANEL WITH GFR - Lipid panel  10. Pituitary adenoma ([place]) Last evaluated 5 years ago, no needed f/up per pt report, no visual sx or concerns - TSH  11.  Seasonal allergies Has improved  12. Eustachian tube dysfunction, left Has improved, saw ENT, she requested   13. Postmenopausal estrogen deficiency With menopause >10years ago and Vit D deficiency, feel Dexa is due and appropriate, encouraged weight bearing exercise and Vit D and Calcium supplement daily/or in diet, checking labs - DG Bone Density; Future  14. Screening for endocrine, metabolic and immunity disorder - TSH  15. Encounter for screening mammogram for  breast cancer - MM 3D SCREEN BREAST BILATERAL; Future  Orders Placed This Encounter  Procedures   MM 3D SCREEN BREAST BILATERAL    Standing Status:   Future    Standing Expiration Date:   05/15/2020    Order Specific Question:   Reason for Exam (SYMPTOM  OR DIAGNOSIS REQUIRED)    Answer:   screen for breast cancer    Order Specific Question:   Preferred imaging location?    Answer:   [facility]   DG Bone Density    Standing Status:   Future    Standing Expiration Date:   05/15/2020    Order Specific Question:   Reason for Exam (SYMPTOM  OR DIAGNOSIS REQUIRED)    Answer:   vit d deficiency, estrogen deficiency postmenopausal    Order Specific Question:   Preferred [facility] location?    Answer[facility]   CBC with Differential/Platelet   COMPLETE METABOLIC PANEL WITH GFR   Lipid panel    Order Specific Question:   Has the patient fasted?    Answer:   Yes   Hemoglobin A1c   VITAMIN D 25 Hydroxy (Vit-D Deficiency, Fractures)   TSH        Delsa Grana, PA-C 03/16/19 5:39 PM  [facility]

## 2019-03-17 LAB — HEMOGLOBIN A1C
Hgb A1c MFr Bld: 6.1 % of total Hgb — ABNORMAL HIGH (ref <5.7)
Mean Plasma Glucose: 128 (calc)
eAG (mmol/L): 7.1 (calc)

## 2019-03-17 LAB — COMPLETE METABOLIC PANEL WITH GFR
AG Ratio: 1.4 (calc) (ref 1.0–2.5)
ALT: 15 U/L (ref 6–29)
AST: 16 U/L (ref 10–35)
Albumin: 4.1 g/dL (ref 3.6–5.1)
Alkaline phosphatase (APISO): 123 U/L (ref 37–153)
BUN: 11 mg/dL (ref 7–25)
CO2: 28 mmol/L (ref 20–32)
Calcium: 9.6 mg/dL (ref 8.6–10.4)
Chloride: 106 mmol/L (ref 98–110)
Creat: 0.88 mg/dL (ref 0.50–0.99)
GFR, Est African American: 80 mL/min/{1.73_m2} (ref >=60)
GFR, Est Non African American: 69 mL/min/{1.73_m2} (ref >=60)
Globulin: 3 g/dL (calc) (ref 1.9–3.7)
Glucose, Bld: 106 mg/dL — ABNORMAL HIGH (ref 65–99)
Potassium: 4.4 mmol/L (ref 3.5–5.3)
Sodium: 141 mmol/L (ref 135–146)
Total Bilirubin: 0.4 mg/dL (ref 0.2–1.2)
Total Protein: 7.1 g/dL (ref 6.1–8.1)

## 2019-03-17 LAB — LIPID PANEL
Cholesterol: 124 mg/dL (ref <200)
HDL: 51 mg/dL (ref >=50)
LDL Cholesterol (Calc): 59 mg/dL (calc)
Non-HDL Cholesterol (Calc): 73 mg/dL (calc) (ref <130)
Total CHOL/HDL Ratio: 2.4 (calc) (ref <5.0)
Triglycerides: 62 mg/dL (ref <150)

## 2019-03-17 LAB — CBC WITH DIFFERENTIAL/PLATELET
Absolute Monocytes: 403 cells/uL (ref 200–950)
Basophils Absolute: 21 cells/uL (ref 0–200)
Basophils Relative: 0.5 %
Eosinophils Absolute: 59 cells/uL (ref 15–500)
Eosinophils Relative: 1.4 %
HCT: 37.2 % (ref 35.0–45.0)
Hemoglobin: 11.8 g/dL (ref 11.7–15.5)
Lymphs Abs: 1529 cells/uL (ref 850–3900)
MCH: 26.3 pg — ABNORMAL LOW (ref 27.0–33.0)
MCHC: 31.7 g/dL — ABNORMAL LOW (ref 32.0–36.0)
MCV: 82.9 fL (ref 80.0–100.0)
MPV: 10.5 fL (ref 7.5–12.5)
Monocytes Relative: 9.6 %
Neutro Abs: 2188 cells/uL (ref 1500–7800)
Neutrophils Relative %: 52.1 %
Platelets: 361 10*3/uL (ref 140–400)
RBC: 4.49 10*6/uL (ref 3.80–5.10)
RDW: 14.5 % (ref 11.0–15.0)
Total Lymphocyte: 36.4 %
WBC: 4.2 10*3/uL (ref 3.8–10.8)

## 2019-03-17 LAB — TSH: TSH: 1.01 mIU/L (ref 0.40–4.50)

## 2019-03-17 LAB — VITAMIN D 25 HYDROXY (VIT D DEFICIENCY, FRACTURES): Vit D, 25-Hydroxy: 36 ng/mL (ref 30–100)

## 2019-03-17 NOTE — Telephone Encounter (Signed)
Scheduled

## 2019-03-25 ENCOUNTER — Other Ambulatory Visit: Payer: Self-pay | Admitting: Family Medicine

## 2019-03-25 DIAGNOSIS — G4700 Insomnia, unspecified: Secondary | ICD-10-CM

## 2019-04-03 ENCOUNTER — Other Ambulatory Visit: Payer: Self-pay | Admitting: Cardiovascular Disease

## 2019-04-03 DIAGNOSIS — E785 Hyperlipidemia, unspecified: Secondary | ICD-10-CM

## 2019-04-28 ENCOUNTER — Other Ambulatory Visit: Payer: Self-pay | Admitting: Family Medicine

## 2019-04-28 NOTE — Telephone Encounter (Signed)
Approved per protocol. Requested Prescriptions  Pending Prescriptions Disp Refills  . escitalopram (LEXAPRO) 10 MG tablet [Pharmacy Med Name: ESCITALOPRAM 10 MG TABLET] 90 tablet 1    Sig: TAKE 1 TABLET BY MOUTH EVERY DAY     Psychiatry:  Antidepressants - SSRI Passed - 04/28/2019  9:00 AM      Passed - Completed PHQ-2 or PHQ-9 in the last 360 days.      Passed - Valid encounter within last 6 months    Recent Outpatient Visits          1 month ago Adult general medical exam   [facility] Delsa Grana, PA-C   7 months ago Eustachian tube dysfunction, left   [place], NP   8 months ago Acute otitis media, unspecified otitis media type   [place], FNP   9 months ago Impaired fasting glucose   [place], Satira Anis, MD   10 months ago Essential hypertension   [place], Satira Anis, MD      Future Appointments            In 4 months Delsa Grana, PA-C [facility], [facility]

## 2019-07-12 ENCOUNTER — Encounter: Payer: Self-pay | Admitting: Cardiovascular Disease

## 2019-07-12 ENCOUNTER — Other Ambulatory Visit: Payer: Self-pay

## 2019-07-12 ENCOUNTER — Ambulatory Visit (INDEPENDENT_AMBULATORY_CARE_PROVIDER_SITE_OTHER): Payer: 59 | Admitting: Cardiovascular Disease

## 2019-07-12 VITALS — BP 154/92 | HR 65 | Ht 66.0 in | Wt 204.0 lb

## 2019-07-12 DIAGNOSIS — R7301 Impaired fasting glucose: Secondary | ICD-10-CM

## 2019-07-12 DIAGNOSIS — E782 Mixed hyperlipidemia: Secondary | ICD-10-CM

## 2019-07-12 DIAGNOSIS — G4730 Sleep apnea, unspecified: Secondary | ICD-10-CM | POA: Diagnosis not present

## 2019-07-12 DIAGNOSIS — I10 Essential (primary) hypertension: Secondary | ICD-10-CM

## 2019-07-12 DIAGNOSIS — R079 Chest pain, unspecified: Secondary | ICD-10-CM

## 2019-07-12 NOTE — Progress Notes (Signed)
Date:  07/12/2019   ID:  [name], DOB 1955/11/25, MRN [id number]  Patient Location:  [place] [address]   Provider location:   [facility], [place] office  PCP:  Delsa Grana, PA-C  Cardiologist:  Patsy [place]   Chief Complaint  Patient presents with  . Other    Patient c/o chest pain, SOB when having chest pain, and Arm pain. Meds reviewed verbally with patient.     History of Present Illness:    [name] is a 64 y.o. female  with past medical history of pituitary adenoma s/p resection hypertension Nonsmoking Nondiabetic Chest pain Who presents for follow-up of her chest pain  07/24/2017 ER for chest pain, HTN  09/04/2017 Ate peanuts, layed down, developed chest pain Pain continued, BP went high Went to the ER, CXR, EKG, head MRI  Last seen in clinic July 2019 At that time was having some chest pain episodes, felt secondary to home stress Spikes in blood pressure, situational stress  was on losartan and amlodipine  No prior ischemic work-up No recent trips to the emergency room  Having some chest pain past few weeks Some left arm pain Poor sleep Taking care of sisters, still very stressful 1 sister has cancer, her 2 years was sleeping on her couch  Lab work reviewed Total cholesterol 124, LDL 59 A1c 6.1  EKG personally reviewed by myself on todays visit Shows normal sinus rhythm rate 65 bpm rare PVC no significant ST-T wave changes   Prior CV studies:   The following studies were reviewed today:   Past Medical History:  Diagnosis Date  . Anemia   . Benign hypertensive renal disease   . Benign hypertensive renal disease   . DDD (degenerative disc disease), lumbar   . Elevated alkaline phosphatase level   . Fibrocystic disease of both breasts   . GERD (gastroesophageal reflux disease)   . Hyperlipidemia   . Hypertension   . Impaired fasting glucose   . Impaired fasting glucose   .  Insomnia   . Menopausal state   . Migraine   . Osteoarthritis   . 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 Perforation of nasal septum   . Pre-diabetes   . Recurrent major depression ([place])   . Sleep apnea    Does not use CPAP  . Spondylosis   . Thrombocytosis ([place]) 10/01/2015   mild  . Wears dentures    full upper   Past Surgical History:  Procedure Laterality Date  . ABDOMINAL HYSTERECTOMY     partial with bladder suspension, no cervix  . CHOLECYSTECTOMY  2003  . COLONOSCOPY  2008   1 polyp- return 10 years  . COLONOSCOPY WITH PROPOFOL N/A 06/30/2017   Procedure: COLONOSCOPY WITH PROPOFOL;  Surgeon: Lucilla Lame, MD;  Location: [facility];  Service: Endoscopy;  Laterality: N/A;  slep apnea  . KNEE SURGERY Right 2014  . PITUITARY SURGERY     x2 30+ years ago  . transnasal approach pituitary adenoma resection  2015   clival/parasellar mass    Allergies:   Lisinopril, Shrimp [shellfish allergy], and Sulfa antibiotics   Social History   Tobacco Use  . Smoking status: Passive Smoke Exposure - Never Smoker  . Smokeless tobacco: Never Used  Substance Use Topics  . Alcohol use: No    Comment: rare (1-2 drinks/yr)  . Drug use: No     Current Outpatient Medications  on File Prior to Visit  Medication Sig Dispense Refill  . amLODipine (NORVASC) 10 MG tablet TAKE 1 TABLET (10 MG TOTAL) BY MOUTH DAILY. FOR BLOOD PRESSURE 90 tablet 3  . atorvastatin (LIPITOR) 20 MG tablet TAKE 1 TABLET (20 MG TOTAL) BY MOUTH AT BEDTIME. PATIENT NEEDS AN APPOINTMENT FOR FURTHER REFILLS. 90 tablet 0  . cholecalciferol (VITAMIN D3) 25 MCG (1000 UT) tablet Take 1,000 Units by mouth daily.    Marland Kitchen escitalopram (LEXAPRO) 10 MG tablet TAKE 1 TABLET BY MOUTH EVERY DAY 90 tablet 1  . fluticasone (FLONASE) 50 MCG/ACT nasal spray PLACE 2 SPRAYS INTO BOTH NOSTRILS AS NEEDED. 16 g 3  . furosemide (LASIX) 20 MG tablet PLEASE SEE ATTACHED FOR DETAILED DIRECTIONS 90 tablet 0  . hydrOXYzine (VISTARIL) 25 MG capsule  Take 1-2 capsules (25-50 mg total) by mouth 3 (three) times daily as needed for anxiety (one hour before bedtime prn for insomnia). 30 capsule 1  . losartan (COZAAR) 100 MG tablet TAKE 1 TABLET BY MOUTH EVERY DAY 90 tablet 3  . metFORMIN (GLUCOPHAGE) 500 MG tablet Take 500 mg by mouth every other day.   3  . polyethylene glycol powder (GLYCOLAX/MIRALAX) powder as needed.   0  . traZODone (DESYREL) 50 MG tablet Take 0.5-1 tablets (25-50 mg total) by mouth at bedtime as needed for sleep.     No current facility-administered medications on file prior to visit.     Family Hx: The patient's family history includes Alzheimer's disease in her father; Atrial fibrillation in her sister; Breast cancer in her maternal grandmother; Cancer in her mother, sister, and sister; Diabetes in her mother and sister; Heart attack in her father; Heart disease in her father; Hypertension in her father, mother, and sister; Lymphoma in her sister; Stroke in her sister and sister.  ROS:   Please see the history of present illness.    Review of Systems  Constitutional: Negative.   HENT: Negative.   Respiratory: Negative.   Cardiovascular: Positive for chest pain.       Left arm pain  Gastrointestinal: Negative.   Musculoskeletal: Negative.   Neurological: Negative.   Psychiatric/Behavioral: Negative.   All other systems reviewed and are negative.    Labs/Other Tests and Data Reviewed:    Recent Labs: 03/16/2019: ALT 15; BUN 11; Creat 0.88; Hemoglobin 11.8; Platelets 361; Potassium 4.4; Sodium 141; TSH 1.01   Recent Lipid Panel Lab Results  Component Value Date/Time   CHOL 124 03/16/2019 12:00 AM   CHOL 155 02/06/2016 08:52 AM   TRIG 62 03/16/2019 12:00 AM   HDL 51 03/16/2019 12:00 AM   HDL 48 02/06/2016 08:52 AM   CHOLHDL 2.4 03/16/2019 12:00 AM   LDLCALC 59 03/16/2019 12:00 AM    Wt Readings from Last 3 Encounters:  03/16/19 201 lb 9.6 oz (91.4 kg)  03/10/19 198 lb (89.8 kg)  09/21/18 206 lb (93.4  kg)     Exam:    Vital Signs: Vital signs may also be detailed in the HPI There were no vitals taken for this visit.  Wt Readings from Last 3 Encounters:  03/16/19 201 lb 9.6 oz (91.4 kg)  03/10/19 198 lb (89.8 kg)  09/21/18 206 lb (93.4 kg)   Temp Readings from Last 3 Encounters:  03/16/19 97.9 F (36.6 C)  07/21/18 98.3 F (36.8 C) (Oral)  06/15/18 98.4 F (36.9 C)   BP Readings from Last 3 Encounters:  03/16/19 140/82  03/10/19 123/80  09/21/18 119/82   Pulse  Readings from Last 3 Encounters:  03/16/19 64  03/10/19 66  09/21/18 66     Well nourished, well developed female in no acute distress. Constitutional:  oriented to person, place, and time. No distress.    ASSESSMENT & PLAN:    Problem List Items Addressed This Visit      Cardiology Problems   Hyperlipidemia   Hypertension - Primary     Other   Sleep apnea    Other Visit Diagnoses    Impaired fasting glucose         HTN Blood pressure is well controlled on today's visit. No changes made to the medications.  Hyperlipidemia Continue Lipitor 20 daily Cholesterol is at goal on the current lipid regimen. No changes to the medications were made.  Chest pain Significant stressors at home Taking care of 2 sisters, 1 with recent stroke 1 sister with cancer -Unable to exclude underlying ischemia though symptoms are somewhat atypical, presenting at rest, some tingling down the left arm concerning for cervical radiculopathy --Recommend risk stratification study, CT coronary calcium scoring ordered --For high risk score, additional testing can be performed  Borderline glucose Hemoglobin A1c 6.1 We have encouraged continued exercise, careful diet management in an effort to lose weight.  Follow-up as needed   Signed, Ida Rogue, MD  [facility] [address] [place] [address], [place],  [address]

## 2019-07-12 NOTE — Patient Instructions (Addendum)
We will order CT coronary calcium score $150   Please call [phone] to schedule     [facility]  [address] [place], [place] [address]   Medication Instructions:  No changes  If you need a refill on your cardiac medications before your next appointment, please call your pharmacy.    Lab work: No new labs needed   If you have labs (blood work) drawn today and your tests are completely normal, you will receive your results only by: Marland Kitchen MyChart Message (if you have MyChart) OR . A paper copy in the mail If you have any lab test that is abnormal or we need to change your treatment, we will call you to review the results.   Testing/Procedures: No new testing needed   Follow-Up: At [facility], you and your health needs are our priority.  As part of our continuing mission to provide you with exceptional heart care, we have created designated Provider Care Teams.  These Care Teams include your primary Cardiologist (physician) and Advanced Practice Providers (APPs -  Physician Assistants and Nurse Practitioners) who all work together to provide you with the care you need, when you need it.  . You will need a follow up appointment as needed   . Providers on your designated Care Team:   . Murray Hodgkins, NP . Christell Faith, PA-C . Marrianne Mood, PA-C  Any Other Special Instructions Will Be Listed Below (If Applicable).  For educational health videos Log in to : [email or web address] Or : [email or web address], password : [email or web address]

## 2019-07-14 ENCOUNTER — Other Ambulatory Visit: Payer: Self-pay | Admitting: Family Medicine

## 2019-07-23 ENCOUNTER — Ambulatory Visit (INDEPENDENT_AMBULATORY_CARE_PROVIDER_SITE_OTHER)
Admission: RE | Admit: 2019-07-23 | Discharge: 2019-07-23 | Disposition: A | Discharge status: Home / Self Care | Payer: Self-pay | Source: Ambulatory Visit | Attending: Cardiovascular Disease | Admitting: Cardiovascular Disease

## 2019-07-23 ENCOUNTER — Other Ambulatory Visit: Payer: Self-pay

## 2019-07-23 DIAGNOSIS — R079 Chest pain, unspecified: Secondary | ICD-10-CM

## 2019-07-26 ENCOUNTER — Telehealth: Payer: Self-pay

## 2019-07-26 NOTE — Telephone Encounter (Signed)
Attempted to call patient. LMTCB 07/26/2019   

## 2019-07-26 NOTE — Telephone Encounter (Signed)
-----   Message from Minna Merritts, MD sent at 07/24/2019 11:28 AM EST ----- Calcium score Score of 0,  Very good news indicating no significant calcified plaque No indication for further work-up at this time This would suggest her periodic chest pain is more likely secondary to the significant stress she is going through Overall good news

## 2019-07-27 NOTE — Telephone Encounter (Signed)
Spoke to patient to discuss Ca score results.   Pt verbalized understanding and had no further questions at this time.   Advised pt to call for any further questions or concerns.

## 2019-09-13 ENCOUNTER — Other Ambulatory Visit: Payer: Self-pay

## 2019-09-13 ENCOUNTER — Encounter: Payer: Self-pay | Admitting: Family Medicine

## 2019-09-13 ENCOUNTER — Ambulatory Visit (INDEPENDENT_AMBULATORY_CARE_PROVIDER_SITE_OTHER): Payer: 59 | Admitting: Family Medicine

## 2019-09-13 VITALS — BP 126/82 | HR 74 | Temp 97.9°F | Resp 14 | Ht 66.0 in | Wt 205.6 lb

## 2019-09-13 DIAGNOSIS — E785 Hyperlipidemia, unspecified: Secondary | ICD-10-CM | POA: Diagnosis not present

## 2019-09-13 DIAGNOSIS — Z636 Dependent relative needing care at home: Secondary | ICD-10-CM

## 2019-09-13 DIAGNOSIS — E220 Acromegaly and pituitary gigantism: Secondary | ICD-10-CM

## 2019-09-13 DIAGNOSIS — E119 Type 2 diabetes mellitus without complications: Secondary | ICD-10-CM | POA: Diagnosis not present

## 2019-09-13 DIAGNOSIS — D352 Benign neoplasm of pituitary gland: Secondary | ICD-10-CM

## 2019-09-13 DIAGNOSIS — F430 Acute stress reaction: Secondary | ICD-10-CM

## 2019-09-13 DIAGNOSIS — F3341 Major depressive disorder, recurrent, in partial remission: Secondary | ICD-10-CM

## 2019-09-13 DIAGNOSIS — Z5181 Encounter for therapeutic drug level monitoring: Secondary | ICD-10-CM

## 2019-09-13 DIAGNOSIS — K219 Gastro-esophageal reflux disease without esophagitis: Secondary | ICD-10-CM

## 2019-09-13 DIAGNOSIS — I10 Essential (primary) hypertension: Secondary | ICD-10-CM

## 2019-09-13 DIAGNOSIS — F331 Major depressive disorder, recurrent, moderate: Secondary | ICD-10-CM

## 2019-09-13 MED ORDER — METFORMIN HCL 500 MG PO TABS: 500.0000 mg | ORAL_TABLET | ORAL | 3 refills | Status: DC

## 2019-09-13 MED ORDER — LOSARTAN POTASSIUM 100 MG PO TABS: 100.0000 mg | ORAL_TABLET | Freq: Every day | ORAL | 3 refills | Status: DC

## 2019-09-13 MED ORDER — AMLODIPINE BESYLATE 10 MG PO TABS: ORAL_TABLET | ORAL | 3 refills | Status: DC

## 2019-09-13 MED ORDER — ATORVASTATIN CALCIUM 20 MG PO TABS: 20.0000 mg | ORAL_TABLET | Freq: Every day | ORAL | 3 refills | Status: DC
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
## 2019-09-14 LAB — COMPLETE METABOLIC PANEL WITH GFR
AG Ratio: 1.3 (calc) (ref 1.0–2.5)
ALT: 13 U/L (ref 6–29)
AST: 14 U/L (ref 10–35)
Albumin: 4 g/dL (ref 3.6–5.1)
Alkaline phosphatase (APISO): 137 U/L (ref 37–153)
BUN: 12 mg/dL (ref 7–25)
CO2: 28 mmol/L (ref 20–32)
Calcium: 9.6 mg/dL (ref 8.6–10.4)
Chloride: 108 mmol/L (ref 98–110)
Creat: 0.8 mg/dL (ref 0.50–0.99)
GFR, Est African American: 90 mL/min/{1.73_m2} (ref >=60)
GFR, Est Non African American: 78 mL/min/{1.73_m2} (ref >=60)
Globulin: 3.2 g/dL (calc) (ref 1.9–3.7)
Glucose, Bld: 105 mg/dL — ABNORMAL HIGH (ref 65–99)
Potassium: 4.1 mmol/L (ref 3.5–5.3)
Sodium: 143 mmol/L (ref 135–146)
Total Bilirubin: 0.5 mg/dL (ref 0.2–1.2)
Total Protein: 7.2 g/dL (ref 6.1–8.1)

## 2019-09-14 LAB — CBC WITH DIFFERENTIAL/PLATELET
Absolute Monocytes: 480 cells/uL (ref 200–950)
Basophils Absolute: 21 cells/uL (ref 0–200)
Basophils Relative: 0.5 %
Eosinophils Absolute: 70 cells/uL (ref 15–500)
Eosinophils Relative: 1.7 %
HCT: 35.7 % (ref 35.0–45.0)
Hemoglobin: 11.4 g/dL — ABNORMAL LOW (ref 11.7–15.5)
Lymphs Abs: 1570 cells/uL (ref 850–3900)
MCH: 26.6 pg — ABNORMAL LOW (ref 27.0–33.0)
MCHC: 31.9 g/dL — ABNORMAL LOW (ref 32.0–36.0)
MCV: 83.4 fL (ref 80.0–100.0)
MPV: 10.2 fL (ref 7.5–12.5)
Monocytes Relative: 11.7 %
Neutro Abs: 1960 cells/uL (ref 1500–7800)
Neutrophils Relative %: 47.8 %
Platelets: 365 10*3/uL (ref 140–400)
RBC: 4.28 10*6/uL (ref 3.80–5.10)
RDW: 14.5 % (ref 11.0–15.0)
Total Lymphocyte: 38.3 %
WBC: 4.1 10*3/uL (ref 3.8–10.8)

## 2019-09-14 LAB — LIPID PANEL
Cholesterol: 155 mg/dL (ref <200)
HDL: 49 mg/dL — ABNORMAL LOW (ref >=50)
LDL Cholesterol (Calc): 91 mg/dL (calc)
Non-HDL Cholesterol (Calc): 106 mg/dL (calc) (ref <130)
Total CHOL/HDL Ratio: 3.2 (calc) (ref <5.0)
Triglycerides: 61 mg/dL (ref <150)

## 2019-09-14 LAB — HEMOGLOBIN A1C
Hgb A1c MFr Bld: 6.1 % of total Hgb — ABNORMAL HIGH (ref <5.7)
Mean Plasma Glucose: 128 (calc)
eAG (mmol/L): 7.1 (calc)

## 2019-09-22 ENCOUNTER — Encounter: Payer: Self-pay | Admitting: Family Medicine

## 2019-09-27 ENCOUNTER — Ambulatory Visit: Payer: Self-pay | Admitting: *Deleted

## 2019-09-27 NOTE — Chronic Care Management (AMB) (Addendum)
  Care Management   Social Work Note  09/27/2019 Name: [name] MRN: [id number] DOB: 27-Oct-1955  [name] is a 64 y.o. year old female who sees Delsa Grana, [place] for primary care. The CCM team was consulted for assistance with Mental Health Counseling and Resources.   Patient contacted, however stated that she was unable to talk at the time of the call and requested a return call on Thursday after 12pm.  SDOH (Social Determinants of Health) assessments performed: No     Outpatient Encounter Medications as of 09/27/2019  Medication Sig  . amLODipine (NORVASC) 10 MG tablet TAKE 1 TABLET BY MOUTH EVERY DAY FOR BLOOD PRESSURE  . atorvastatin (LIPITOR) 20 MG tablet Take 1 tablet (20 mg total) by mouth at bedtime. Patient needs an appointment for further refills.  . cholecalciferol (VITAMIN D3) 25 MCG (1000 UT) tablet Take 1,000 Units by mouth daily.  . fluticasone (FLONASE) 50 MCG/ACT nasal spray PLACE 2 SPRAYS INTO BOTH NOSTRILS AS NEEDED.  . furosemide (LASIX) 20 MG tablet PLEASE SEE ATTACHED FOR DETAILED DIRECTIONS  . losartan (COZAAR) 100 MG tablet Take 1 tablet (100 mg total) by mouth daily.  . metFORMIN (GLUCOPHAGE) 500 MG tablet Take 1 tablet (500 mg total) by mouth every other day.  . polyethylene glycol powder (GLYCOLAX/MIRALAX) powder as needed.    No facility-administered encounter medications on file as of 09/27/2019.    Goals Addressed   None     Follow Up Plan: Appointment scheduled for SW follow up with client by phone on: 09/30/19  Elliot Gurney, [place] Worker  [facility] Management [phone]

## 2019-09-29 ENCOUNTER — Ambulatory Visit
Admission: RE | Admit: 2019-09-29 | Discharge: 2019-09-29 | Disposition: A | Discharge status: Home / Self Care | Payer: 59 | Source: Ambulatory Visit | Attending: Family Medicine | Admitting: Family Medicine

## 2019-09-29 DIAGNOSIS — Z1231 Encounter for screening mammogram for malignant neoplasm of breast: Secondary | ICD-10-CM

## 2019-09-30 ENCOUNTER — Ambulatory Visit: Payer: Self-pay | Admitting: *Deleted

## 2019-09-30 ENCOUNTER — Telehealth: Payer: Self-pay | Admitting: *Deleted

## 2019-09-30 NOTE — Chronic Care Management (AMB) (Addendum)
   Care Management   Social Work Note  09/30/2019 Name: [name] MRN: [id number] DOB: 1956/01/01  [name] is a 64 y.o. year old female who sees Delsa Grana, [place] for primary care. The CCM team was consulted for assistance with Mental Health Counseling and Resources.   Patient contacted, however per patient she was traveling and would need a call next week. This social worker scheduled a return call on 10/07/19  SDOH (Social Determinants of Health) assessments performed: No     Outpatient Encounter Medications as of 09/30/2019  Medication Sig  . amLODipine (NORVASC) 10 MG tablet TAKE 1 TABLET BY MOUTH EVERY DAY FOR BLOOD PRESSURE  . atorvastatin (LIPITOR) 20 MG tablet Take 1 tablet (20 mg total) by mouth at bedtime. Patient needs an appointment for further refills.  . cholecalciferol (VITAMIN D3) 25 MCG (1000 UT) tablet Take 1,000 Units by mouth daily.  . fluticasone (FLONASE) 50 MCG/ACT nasal spray PLACE 2 SPRAYS INTO BOTH NOSTRILS AS NEEDED.  . furosemide (LASIX) 20 MG tablet PLEASE SEE ATTACHED FOR DETAILED DIRECTIONS  . losartan (COZAAR) 100 MG tablet Take 1 tablet (100 mg total) by mouth daily.  . metFORMIN (GLUCOPHAGE) 500 MG tablet Take 1 tablet (500 mg total) by mouth every other day.  . polyethylene glycol powder (GLYCOLAX/MIRALAX) powder as needed.    No facility-administered encounter medications on file as of 09/30/2019.    Goals Addressed   None     Follow Up Plan: Appointment scheduled for SW follow up with client by phone on: 10/07/19   Elliot Gurney, [place] Worker  [facility] Care Management [phone]

## 2019-09-30 NOTE — Chronic Care Management (AMB) (Signed)
    Care Management   Unsuccessful Call Note 09/30/2019 Name: [name] MRN: [id number] DOB: 06/29/1955  Patient  is a 64year old female who sees Delsa Grana, [place] for primary care. Delsa Grana, PA-C asked the CCM team to consult the patient for Mental Health Counseling and Resources.  Referral was placed 09/13/19. Patient's last office visit was 09/13/19.     This social worker was unable to reach patient via telephone today for initial call and assessment. I have left HIPAA compliant voicemail asking patient to return my call. (unsuccessful outreach #1).   Plan: Will follow-up within 7 business days via telephone.     Elliot Gurney, [place] Administrator, [facility] [phone]

## 2019-10-07 ENCOUNTER — Ambulatory Visit: Payer: Self-pay | Admitting: *Deleted

## 2019-10-07 ENCOUNTER — Telehealth: Payer: Self-pay

## 2019-10-07 NOTE — Chronic Care Management (AMB) (Signed)
   Chronic Care Management   Unsuccessful Call Note 10/07/2019 Name: [name] MRN: [id number] DOB: 1955/12/13  Patient is a 64 year old female who sees Delsa Grana, [place] for primary care. Delsa Grana, PA-C asked the CCM team to consult the patient for Mental Health Counseling and Resources.      This social worker was unable to reach patient via telephone today for for initial call. I have left HIPAA compliant voicemail asking patient to return my call. (unsuccessful outreach #1).   Plan: Will follow-up within 7 business days via telephone.     Elliot Gurney, [place] Administrator, [facility] Care Management [phone]

## 2019-10-14 ENCOUNTER — Ambulatory Visit: Payer: Self-pay | Admitting: *Deleted

## 2019-10-14 ENCOUNTER — Telehealth: Payer: Self-pay | Admitting: *Deleted

## 2019-10-14 NOTE — Chronic Care Management (AMB) (Signed)
   Care Management   Social Work Note  10/14/2019 Name: [name] MRN: [id number] DOB: 1956-04-16  [name] is a 64 y.o. year old female who sees Delsa Grana, [place] for primary care. The CCM team was consulted for assistance with Mental Health Counseling and Resources.   Phone call to patient. Patient requested a call at another time stating that she was on her way out. Initial appointment re-scheduled for 10/18/19.  SDOH (Social Determinants of Health) assessments performed: No     Outpatient Encounter Medications as of 10/14/2019  Medication Sig  . amLODipine (NORVASC) 10 MG tablet TAKE 1 TABLET BY MOUTH EVERY DAY FOR BLOOD PRESSURE  . atorvastatin (LIPITOR) 20 MG tablet Take 1 tablet (20 mg total) by mouth at bedtime. Patient needs an appointment for further refills.  . cholecalciferol (VITAMIN D3) 25 MCG (1000 UT) tablet Take 1,000 Units by mouth daily.  . fluticasone (FLONASE) 50 MCG/ACT nasal spray PLACE 2 SPRAYS INTO BOTH NOSTRILS AS NEEDED.  . furosemide (LASIX) 20 MG tablet PLEASE SEE ATTACHED FOR DETAILED DIRECTIONS  . losartan (COZAAR) 100 MG tablet Take 1 tablet (100 mg total) by mouth daily.  . metFORMIN (GLUCOPHAGE) 500 MG tablet Take 1 tablet (500 mg total) by mouth every other day.  . polyethylene glycol powder (GLYCOLAX/MIRALAX) powder as needed.    No facility-administered encounter medications on file as of 10/14/2019.    Goals Addressed   None     Follow Up Plan: SW will follow up with patient by phone over the next 7-10 business days  [place], [place] Worker  [facility] Care Management [phone]

## 2019-10-18 ENCOUNTER — Telehealth: Payer: Self-pay | Admitting: *Deleted

## 2019-10-18 ENCOUNTER — Ambulatory Visit: Payer: Self-pay | Admitting: *Deleted

## 2019-10-18 NOTE — Chronic Care Management (AMB) (Signed)
    Care Management   Unsuccessful Call Note 10/18/2019 Name: [name] MRN: [id number] DOB: 1956/03/12  Patient  is a 64 year old female who sees Delsa Grana, [place] for primary care. Delsa Grana, PA-C asked the CCM team to consult the patient for Mental Health Counseling and Resources.    This social worker was unable to reach patient via telephone today for scheduled call. I have left HIPAA compliant voicemail asking patient to return my call. (unsuccessful outreach #2).   Plan: Will follow-up within 7 business days via telephone.     Elliot Gurney, [place] Administrator, [facility] Care Management [phone]

## 2019-10-18 NOTE — Chronic Care Management (AMB) (Signed)
   Care Management   Social Work Note  10/18/2019 Name: [name] MRN: [id number] DOB: 10-26-1955  [name] is a 64 y.o. year old female who sees Delsa Grana, [place] for primary care. The CCM team was consulted for assistance with Mental Health Counseling and Resources.   Return call from patient stating that she was not at home however she did want to schedule another time to talk.  Phone call set up for 10/25/19 at 9:00 am  SDOH (Social Determinants of Health) assessments performed: No     Outpatient Encounter Medications as of 10/18/2019  Medication Sig  . amLODipine (NORVASC) 10 MG tablet TAKE 1 TABLET BY MOUTH EVERY DAY FOR BLOOD PRESSURE  . atorvastatin (LIPITOR) 20 MG tablet Take 1 tablet (20 mg total) by mouth at bedtime. Patient needs an appointment for further refills.  . cholecalciferol (VITAMIN D3) 25 MCG (1000 UT) tablet Take 1,000 Units by mouth daily.  . fluticasone (FLONASE) 50 MCG/ACT nasal spray PLACE 2 SPRAYS INTO BOTH NOSTRILS AS NEEDED.  . furosemide (LASIX) 20 MG tablet PLEASE SEE ATTACHED FOR DETAILED DIRECTIONS  . losartan (COZAAR) 100 MG tablet Take 1 tablet (100 mg total) by mouth daily.  . metFORMIN (GLUCOPHAGE) 500 MG tablet Take 1 tablet (500 mg total) by mouth every other day.  . polyethylene glycol powder (GLYCOLAX/MIRALAX) powder as needed.    No facility-administered encounter medications on file as of 10/18/2019.    Goals Addressed   None     Follow Up Plan: SW will follow up with patient by phone over the next 10/25/19  Elliot Gurney, [place] Worker  [facility] Care Management [phone]

## 2019-10-25 ENCOUNTER — Ambulatory Visit: Payer: Self-pay | Admitting: *Deleted

## 2019-10-25 ENCOUNTER — Telehealth: Payer: Self-pay

## 2019-10-25 DIAGNOSIS — F331 Major depressive disorder, recurrent, moderate: Secondary | ICD-10-CM

## 2019-10-25 DIAGNOSIS — Z636 Dependent relative needing care at home: Secondary | ICD-10-CM

## 2019-10-25 DIAGNOSIS — F430 Acute stress reaction: Secondary | ICD-10-CM

## 2019-10-25 NOTE — Patient Instructions (Signed)
Thank you allowing the Chronic Care Management Team to be a part of your care! It was a pleasure speaking with you today!  1. Please anticipate a phone call from the [facility] to scheduled your initial appointment  CCM (Chronic Care Management) Team   Neldon Labella RN, BSN Nurse Care Coordinator  [phone]  [place], LCSW Clinical Social Worker [phone]  Goals Addressed              This Visit's Progress   .  "I want to learn how not to be so nice" (pt-stated)        CARE PLAN ENTRY (see longitudinal plan of care for additional care plan information)  Current Barriers:  Marland Kitchen Mental Health Concerns   Clinical Social Work Clinical Goal(s):  Marland Kitchen Over the next 90 days, patient will follow up with a mental health therapist for ongoing treatment* as directed by SW  Interventions: . Inter-disciplinary care team collaboration (see longitudinal plan of care) . Patient interviewed and appropriate assessments performed . Provided supportive counseling with regard to patient's depression and possible triggers recommending ongoing therapy as follow up . Discussed plans with patient for ongoing care management follow up and provided patient with direct contact information for care management team . Referred [facility] to [facility] (mental health provider) for long term follow up and therapy/counseling .   Patient Self Care Activities:  . Performs ADL's independently . Performs IADL's independently . Knowledge Deficit related to local mental health providers  Initial goal documentation         The patient verbalized understanding of instructions provided today and declined a print copy of patient instruction materials.   Telephone follow up appointment with care management team member scheduled for: 11/01/19

## 2019-10-25 NOTE — Chronic Care Management (AMB) (Signed)
Care Management    Clinical Social Work General Note  10/25/2019 Name: [name] MRN: [id number] DOB: 04-03-1956  [name] is a 64 y.o. year old female who is a primary care patient of Delsa Grana, [place]. The CCM was consulted to assist the patient with Mental Health Counseling and Resources.   [name] was given information about Chronic Care Management services today including:  1. CM service includes personalized support from designated clinical staff supervised by her physician, including individualized plan of care and coordination with other care providers 2. 24/7 contact phone numbers for assistance for urgent and routine care needs. 3. The patient may stop CM services at any time (effective at the end of the month) by phone call to the office staff.  Patient agreed to services and verbal consent obtained.   Review of patient status, including review of consultants reports, relevant laboratory and other test results, and collaboration with appropriate care team members and the patient's provider was performed as part of comprehensive patient evaluation and provision of chronic care management services.    SDOH (Social Determinants of Health) assessments and interventions performed:  Yes    Outpatient Encounter Medications as of 10/25/2019  Medication Sig  . amLODipine (NORVASC) 10 MG tablet TAKE 1 TABLET BY MOUTH EVERY DAY FOR BLOOD PRESSURE  . atorvastatin (LIPITOR) 20 MG tablet Take 1 tablet (20 mg total) by mouth at bedtime. Patient needs an appointment for further refills.  . cholecalciferol (VITAMIN D3) 25 MCG (1000 UT) tablet Take 1,000 Units by mouth daily.  . fluticasone (FLONASE) 50 MCG/ACT nasal spray PLACE 2 SPRAYS INTO BOTH NOSTRILS AS NEEDED.  . furosemide (LASIX) 20 MG tablet PLEASE SEE ATTACHED FOR DETAILED DIRECTIONS  . losartan (COZAAR) 100 MG tablet Take 1 tablet (100 mg total) by mouth daily.  . metFORMIN (GLUCOPHAGE) 500 MG tablet Take 1 tablet  (500 mg total) by mouth every other day.  . polyethylene glycol powder (GLYCOLAX/MIRALAX) powder as needed.    No facility-administered encounter medications on file as of 10/25/2019.    Goals Addressed              This Visit's Progress   .  "I want to learn how not to be so nice" (pt-stated)        CARE PLAN ENTRY (see longitudinal plan of care for additional care plan information)  Current Barriers:  Marland Kitchen Mental Health Concerns   Clinical Social Work Clinical Goal(s):  Marland Kitchen Over the next 90 days, patient will follow up with a mental health therapist for ongoing treatment* as directed by SW  Interventions: . Inter-disciplinary care team collaboration (see longitudinal plan of care) . Patient interviewed and appropriate assessments performed . Provided supportive counseling with regard to patient's depression and possible triggers recommending ongoing therapy as follow up . Discussed plans with patient for ongoing care management follow up and provided patient with direct contact information for care management team . Referred [facility] to [facility] (mental health provider) for long term follow up and therapy/counseling .   Patient Self Care Activities:  . Performs ADL's independently . Performs IADL's independently . Knowledge Deficit related to local mental health providers  Initial goal documentation         Follow Up Plan: SW will follow up with patient by phone over the next 7-10 business days regarding status of referral for ongoing therapy treatment       [place], [facility] [phone]

## 2019-11-01 ENCOUNTER — Telehealth: Payer: Self-pay | Admitting: *Deleted

## 2019-11-01 ENCOUNTER — Ambulatory Visit: Payer: Self-pay | Admitting: *Deleted

## 2019-11-01 NOTE — Chronic Care Management (AMB) (Signed)
    Care Management   Unsuccessful Call Note 11/01/2019 Name: [name] MRN: [id number] DOB: 1956-01-15  Patient is a 64 year old female who sees Delsa Grana, [place] for primary care. Delsa Grana, PA-C asked the CCM team to consult the patient for Mental health Counseling and Resources.   This social worker was unable to reach patient via telephone today to follow up on referral for mental health resources. I have left HIPAA compliant voicemail asking patient to return my call. (unsuccessful outreach #1).   Plan: Will follow-up within 7 business days via telephone.     Elliot Gurney, [place] Administrator[facility] [phone]

## 2019-11-08 ENCOUNTER — Telehealth: Payer: Self-pay | Admitting: *Deleted

## 2019-11-08 NOTE — Chronic Care Management (AMB) (Signed)
   Chronic Care Management   Unsuccessful Call Note 11/08/2019 Name: [name] MRN: [id number] DOB: [birth year]  Patien is a 64 year old female who sees Delsa Grana, [place]  for primary care. Delsa Grana, PA-C asked the CCM team to consult the patient for Mental Health Counseling and Resources.     This social worker was unable to reach patient via telephone today to follow up on referral for ongoing mental health counseling. I have left HIPAA compliant voicemail asking patient to return my call. (unsuccessful outreach #2).   Plan: Will follow-up within 7 business days via telephone.     Elliot Gurney, [place] Administrator[facility] [phone]

## 2019-11-09 ENCOUNTER — Ambulatory Visit: Payer: Self-pay | Admitting: *Deleted

## 2019-11-16 ENCOUNTER — Ambulatory Visit: Payer: Self-pay | Admitting: *Deleted

## 2019-11-16 ENCOUNTER — Telehealth: Payer: Self-pay | Admitting: *Deleted

## 2019-11-16 NOTE — Chronic Care Management (AMB) (Signed)
    Care Management   Unsuccessful Call Note 11/16/2019 Name: CATTALEYA WIEN MRN: [id number] DOB: Nov 08, 1955  Patient  is a 64 year old female who sees Delsa Grana, [place] for primary care. Delsa Grana, PA-C asked the CCM team to consult the patient for Mental Health Counseling and Resources.    This social worker was unable to reach patient via telephone today for follow up call regarding mental health resources provided. I have left HIPAA compliant voicemail asking patient to return my call. (unsuccessful outreach #3).   Plan: This Education officer, museum will not make any additional outreach calls to patient, however will be happy to engage patient upon her return call.    Elliot Gurney, [place] Administrator[facility] [phone]

## 2020-02-16 ENCOUNTER — Ambulatory Visit: Payer: Self-pay | Admitting: *Deleted

## 2020-02-17 NOTE — Chronic Care Management (AMB) (Signed)
°   Care Management   Social Work Note  02/17/2020 Name: [name] MRN: [id number] DOB: 07-08-1955  [name] is a 65 y.o. year old female who sees Delsa Grana, [place] for primary care. The CCM team was consulted for assistance with Mental Health Counseling and Resources.   Follow up phone call to patient to check status of Mental Health Referral previously made for ongoing mental health counseling. Patient was not able to talk at the time of the call stating that she would return the call on 19/8/21. SDOH (Social Determinants of Health) assessments performed: No     Outpatient Encounter Medications as of 02/16/2020  Medication Sig   amLODipine (NORVASC) 10 MG tablet TAKE 1 TABLET BY MOUTH EVERY DAY FOR BLOOD PRESSURE   atorvastatin (LIPITOR) 20 MG tablet Take 1 tablet (20 mg total) by mouth at bedtime. Patient needs an appointment for further refills.   cholecalciferol (VITAMIN D3) 25 MCG (1000 UT) tablet Take 1,000 Units by mouth daily.   fluticasone (FLONASE) 50 MCG/ACT nasal spray PLACE 2 SPRAYS INTO BOTH NOSTRILS AS NEEDED.   furosemide (LASIX) 20 MG tablet PLEASE SEE ATTACHED FOR DETAILED DIRECTIONS   losartan (COZAAR) 100 MG tablet Take 1 tablet (100 mg total) by mouth daily.   metFORMIN (GLUCOPHAGE) 500 MG tablet Take 1 tablet (500 mg total) by mouth every other day.   polyethylene glycol powder (GLYCOLAX/MIRALAX) powder as needed.    No facility-administered encounter medications on file as of 02/16/2020.    Goals Addressed   None     Follow Up Plan: SW will follow up with patient by phone over the next 7-10 business days  [place], [place] Worker  [facility] Care Management [phone]

## 2020-02-21 ENCOUNTER — Ambulatory Visit: Payer: Self-pay | Admitting: *Deleted

## 2020-02-21 ENCOUNTER — Telehealth: Payer: Self-pay | Admitting: *Deleted

## 2020-02-21 NOTE — Telephone Encounter (Signed)
° °  Chronic Care Management   Unsuccessful Call Note 02/21/2020 Name: NIKAYA NASBY MRN: [id number] DOB: 07/03/1955  Patient is a 64 year old female who sees Delsa Grana, [place] for primary care. Delsa Grana, PA-C asked the CCM team to consult the patient for Mental health Counseling and Resources.   This social worker was unable to reach patient via telephone today for follow up call. I have left HIPAA compliant voicemail asking patient to return my call.   Plan: This Education officer, museum will await phone call from patient for needed follow up.    Elliot Gurney, [place] Administrator, [facility] [phone]

## 2020-02-21 NOTE — Chronic Care Management (AMB) (Signed)
   Care Management   Social Work Note  02/21/2020 Name: [name] MRN: [id number] DOB: 02-02-1956  [name] is a 64 y.o. year old female who sees Delsa Grana, [place] for primary care. The CCM team was consulted for assistance with Mental Health Counseling and Resources.   SDOH (Social Determinants of Health) assessments performed: No     Outpatient Encounter Medications as of 02/21/2020  Medication Sig  . amLODipine (NORVASC) 10 MG tablet TAKE 1 TABLET BY MOUTH EVERY DAY FOR BLOOD PRESSURE  . atorvastatin (LIPITOR) 20 MG tablet Take 1 tablet (20 mg total) by mouth at bedtime. Patient needs an appointment for further refills.  . cholecalciferol (VITAMIN D3) 25 MCG (1000 UT) tablet Take 1,000 Units by mouth daily.  . fluticasone (FLONASE) 50 MCG/ACT nasal spray PLACE 2 SPRAYS INTO BOTH NOSTRILS AS NEEDED.  . furosemide (LASIX) 20 MG tablet PLEASE SEE ATTACHED FOR DETAILED DIRECTIONS  . losartan (COZAAR) 100 MG tablet Take 1 tablet (100 mg total) by mouth daily.  . metFORMIN (GLUCOPHAGE) 500 MG tablet Take 1 tablet (500 mg total) by mouth every other day.  . polyethylene glycol powder (GLYCOLAX/MIRALAX) powder as needed.    No facility-administered encounter medications on file as of 02/21/2020.    Goals Addressed   None     Follow Up Plan: Client will call this social worker with any additional community resource needs or concerns  Elliot Gurney, [place] Worker  [facility] Care Management [phone]

## 2020-03-15 NOTE — Progress Notes (Signed)
Patient: [name], Female    DOB: January 14, 1956, 64 y.o.   MRN: [id number] Delsa Grana, PA-C Visit Date: 03/16/2020  Today's Provider: Delsa Grana, PA-C   Chief Complaint  Patient presents with  . Annual Exam   Subjective:   Annual physical exam:  GRABIELA Carr is a 64 y.o. female who presents today for complete physical exam:  Exercise/Activity:  5 d a week for 60 min - walking Diet/nutrition:  Snacking a lot on sweets and chips, esp at night/evening Sleep:  Poor sleep, anxious about the next day  Prediabetes - not taking metformin regularly, she has increased urinary frequency esp nocturia, wonders if sugars are high  HLD - on statin, good compliance, no SE or concerns, lipids done in May LDL was at goal  HTN - on norvasc and losartan, good med compliance, BP at goal today, no Sx or SE, she has seen cardiology in the past, most of her physical sx he attributed to stress  Anxiety from situational stress- she has not talked to a therapist yet, but has stayed in contact with [place], although she has cancelled a lot of virtual appts, just talking to [name] has really helped the pt  USPSTF grade A and B recommendations - reviewed and addressed today  Depression:  Phq 9 completed today by patient, was reviewed by me with patient in the room PHQ score is neg - she refused to complete the phq2 and 9 today, states it will be positive, and she denies SI PHQ 2/9 Scores 03/16/2020 09/13/2019 03/16/2019 09/21/2018  PHQ - 2 Score 0 6 1 0  PHQ- 9 Score - 13 4 0   Depression screen [facility] 2/9 03/16/2020 10/25/2019 09/13/2019 03/16/2019 09/21/2018  Decreased Interest 0 - 3 0 0  Down, Depressed, Hopeless 0 - 3 1 0  PHQ - 2 Score 0 - 6 1 0  Altered sleeping - _0 0  Tired, decreased energy - 1 1 0 0  Change in appetite - 1 1 0 0  Feeling bad or failure about yourself  - 1 1 0 0  Trouble concentrating - 1 1 0 0  Moving slowly or fidgety/restless - 0 0 0 0  Suicidal thoughts  - 0 0 0 0  PHQ-9 Score - - 13 4 0  Difficult doing work/chores - - Somewhat difficult Not difficult at all Not difficult at all  Some recent data might be hidden    Alcohol screening:   Office Visit from 03/16/2020 in [facility]  AUDIT-C Score 0      Immunizations and Health Maintenance: Health Maintenance  Topic Date Due  . DEXA SCAN  Never done  . INFLUENZA VACCINE  08/10/2020 (Originally 12/12/2019)  . PNA vac Low Risk Adult (1 of 2 - PCV13) 03/16/2021 (Originally 12/26/2019)  . MAMMOGRAM  09/28/2020  . TETANUS/TDAP  03/27/2025  . COLONOSCOPY  07/01/2027  . COVID-19 Vaccine  Completed  . Hepatitis C Screening  Completed  . HIV Screening  Completed  . PAP SMEAR-Modifier  Discontinued     Hep C Screening: done  STD testing and prevention (HIV/chl/gon/syphilis):  see above, no additional testing desired by pt today  Intimate partner violence: none, n/a  Sexual History/Pain during Intercourse: Divorced  Menstrual History/LMP/Abnormal Bleeding:  No AUB or pelvic complaints No LMP recorded. Patient has had a hysterectomy.  Incontinence Symptoms: urinating a lot, waking up at night  Breast cancer:  Last Mammogram: *see  HM list above BRCA gene screening: no   Cervical cancer screening: aged out Pt denies family hx of cancers - breast, ovarian, uterine, colon:     Osteoporosis:   Discussion on osteoporosis per age, including high calcium and vitamin D supplementation, weight bearing exercises Pt is on Vit D, not calcium  Due for Bone scan/dexa- ordered  Skin cancer:  Hx of skin CA -  No Discussed atypical lesions   Colorectal cancer:   Colonoscopy is up to date Discussed concerning signs and sx of CRC, pt denies GI sx, change in BM, melena, hematochezia  Lung cancer:   Low Dose CT Chest recommended if Age 19-80 years, 30 pack-year currently smoking OR have quit w/in 15years. Patient does not qualify.    Social History   Tobacco Use  .  Smoking status: Passive Smoke Exposure - Never Smoker  . Smokeless tobacco: Never Used  Vaping Use  . Vaping Use: Never used  Substance Use Topics  . Alcohol use: No    Comment: rare (1-2 drinks/yr)  . Drug use: No       Office Visit from 03/16/2020 in [facility]  AUDIT-C Score 0      Family History  Problem Relation Age of Onset  . Diabetes Mother   . Hypertension Mother   . Cancer Mother        possible liver  . Heart disease Father   . Hypertension Father   . Heart attack Father   . Alzheimer's disease Father   . Diabetes Sister   . Hypertension Sister   . Cancer Sister        lung?  . Stroke Sister   . Atrial fibrillation Sister   . Breast cancer Maternal Grandmother   . Cancer Sister   . Lymphoma Sister   . Stroke Sister      Blood pressure/Hypertension: BP Readings from Last 3 Encounters:  03/16/20 128/84  09/13/19 126/82  07/12/19 (!) 154/92    Weight/Obesity: Wt Readings from Last 3 Encounters:  03/16/20 200 lb 6.4 oz (90.9 kg)  09/13/19 205 lb 9.6 oz (93.3 kg)  07/12/19 204 lb (92.5 kg)   BMI Readings from Last 3 Encounters:  03/16/20 32.35 kg/m  09/13/19 33.18 kg/m  07/12/19 32.93 kg/m     Lipids:  Lab Results  Component Value Date   CHOL 155 09/13/2019   CHOL 124 03/16/2019   CHOL 184 06/15/2018   Lab Results  Component Value Date   HDL 49 (L) 09/13/2019   HDL 51 03/16/2019   HDL 50 06/15/2018   Lab Results  Component Value Date   LDLCALC 91 09/13/2019   LDLCALC 59 03/16/2019   LDLCALC 117 (H) 06/15/2018   Lab Results  Component Value Date   TRIG 61 09/13/2019   TRIG 62 03/16/2019   TRIG 74 06/15/2018   Lab Results  Component Value Date   CHOLHDL 3.2 09/13/2019   CHOLHDL 2.4 03/16/2019   CHOLHDL 3.7 06/15/2018   No results found for: LDLDIRECT Based on the results of lipid panel his/her cardiovascular risk factor ( using Drew )  in the next 10 years is: The 10-year ASCVD risk score Mikey Bussing  DC Brooke Bonito., et al., 2013) is: 17%   Values used to calculate the score:     Age: 18 years     Sex: Female     Is Non-Hispanic African American: Yes     Diabetic: Yes     Tobacco smoker: No  Systolic Blood Pressure: 740 mmHg     Is BP treated: Yes     HDL Cholesterol: 49 mg/dL     Total Cholesterol: 155 mg/dL Glucose:  Glucose, Bld  Date Value Ref Range Status  09/13/2019 105 (H) 65 - 99 mg/dL Final    Comment:    .            Fasting reference interval . For someone without known diabetes, a glucose value between 100 and 125 mg/dL is consistent with prediabetes and should be confirmed with a follow-up test. .   03/16/2019 106 (H) 65 - 99 mg/dL Final    Comment:    .            Fasting reference interval . For someone without known diabetes, a glucose value between 100 and 125 mg/dL is consistent with prediabetes and should be confirmed with a follow-up test. .   06/15/2018 122 (H) 65 - 99 mg/dL Final    Comment:    .            Fasting reference interval . For someone without known diabetes, a glucose value between 100 and 125 mg/dL is consistent with prediabetes and should be confirmed with a follow-up test. .    Hypertension: BP Readings from Last 3 Encounters:  03/16/20 128/84  09/13/19 126/82  07/12/19 (!) 154/92   Obesity: Wt Readings from Last 3 Encounters:  03/16/20 200 lb 6.4 oz (90.9 kg)  09/13/19 205 lb 9.6 oz (93.3 kg)  07/12/19 204 lb (92.5 kg)   BMI Readings from Last 3 Encounters:  03/16/20 32.35 kg/m  09/13/19 33.18 kg/m  07/12/19 32.93 kg/m       Social History      She        Social History   Socioeconomic History  . Marital status: Divorced    Spouse name: Not on file  . Number of children: 2  . Years of education: Not on file  . Highest education level: High school graduate  Occupational History  . Occupation: retired  Tobacco Use  . Smoking status: Passive Smoke Exposure - Never Smoker  . Smokeless tobacco: Never  Used  Vaping Use  . Vaping Use: Never used  Substance and Sexual Activity  . Alcohol use: No    Comment: rare (1-2 drinks/yr)  . Drug use: No  . Sexual activity: Not Currently    Birth control/protection: Surgical  Other Topics Concern  . Not on file  Social History Narrative  . Not on file   Social Determinants of Health   Financial Resource Strain: Low Risk   . Difficulty of Paying Living Expenses: Not very hard  Food Insecurity: No Food Insecurity  . Worried About Charity fundraiser in the Last Year: Never true  . Ran Out of Food in the Last Year: Never true  Transportation Needs: No Transportation Needs  . Lack of Transportation (Medical): No  . Lack of Transportation (Non-Medical): No  Physical Activity: Sufficiently Active  . Days of Exercise per Week: 5 days  . Minutes of Exercise per Session: 60 min  Stress: No Stress Concern Present  . Feeling of Stress : Not at all  Social Connections: Moderately Isolated  . Frequency of Communication with Friends and Family: More than three times a week  . Frequency of Social Gatherings with Friends and Family: More than three times a week  . Attends Religious Services: 1 to 4 times per year  . Active  Member of Clubs or Organizations: No  . Attends Archivist Meetings: Never  . Marital Status: Divorced    Family History        Family History  Problem Relation Age of Onset  . Diabetes Mother   . Hypertension Mother   . Cancer Mother        possible liver  . Heart disease Father   . Hypertension Father   . Heart attack Father   . Alzheimer's disease Father   . Diabetes Sister   . Hypertension Sister   . Cancer Sister        lung?  . Stroke Sister   . Atrial fibrillation Sister   . Breast cancer Maternal Grandmother   . Cancer Sister   . Lymphoma Sister   . Stroke Sister     Patient Active Problem List   Diagnosis Date Noted  . Hiatal hernia 11/10/2017  . Hypertension 06/13/2017  . Dysfunction of  left eustachian tube 10/01/2015  . Vitamin D deficiency 10/01/2015  . Prediabetes 09/27/2015  . Perforated nasal septum 08/25/2015  . GERD (gastroesophageal reflux disease)   . Osteoarthritis   . Hyperlipidemia   . Sleep apnea   . DDD (degenerative disc disease), lumbar   . Insomnia   . Recurrent major depression ([place])   . Migraine   . Pituitary adenoma ([place]) 11/12/2013  . 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 ABDOMINAL HYSTERECTOMY     partial with bladder suspension, no cervix  . CHOLECYSTECTOMY  2003  . COLONOSCOPY  2008   1 polyp- return 10 years  . COLONOSCOPY WITH PROPOFOL N/A 06/30/2017   Procedure: COLONOSCOPY WITH PROPOFOL;  Surgeon: Lucilla Lame, MD;  Location: [facility];  Service: Endoscopy;  Laterality: N/A;  slep apnea  . KNEE SURGERY Right 2014  . PITUITARY SURGERY     x2 30+ years ago  . transnasal approach pituitary adenoma resection  2015   clival/parasellar mass     Current Outpatient Medications:  .  amLODipine (NORVASC) 10 MG tablet, TAKE 1 TABLET BY MOUTH EVERY DAY FOR BLOOD PRESSURE, Disp: 90 tablet, Rfl: 3 .  atorvastatin (LIPITOR) 20 MG tablet, Take 1 tablet (20 mg total) by mouth at bedtime. Patient needs an appointment for further refills., Disp: 90 tablet, Rfl: 3 .  fluticasone (FLONASE) 50 MCG/ACT nasal spray, PLACE 2 SPRAYS INTO BOTH NOSTRILS AS NEEDED., Disp: 16 g, Rfl: 3 .  furosemide (LASIX) 20 MG tablet, PLEASE SEE ATTACHED FOR DETAILED DIRECTIONS, Disp: 90 tablet, Rfl: 0 .  losartan (COZAAR) 100 MG tablet, Take 1 tablet (100 mg total) by mouth daily., Disp: 90 tablet, Rfl: 3 .  metFORMIN (GLUCOPHAGE) 500 MG tablet, Take 1 tablet (500 mg total) by mouth every other day., Disp: 90 tablet, Rfl: 3 .  polyethylene glycol powder (GLYCOLAX/MIRALAX) powder, as needed. , Disp: , Rfl: 0 .  cholecalciferol (VITAMIN D3) 25 MCG (1000 UT) tablet, Take 1,000 Units by mouth daily. (Patient not taking:  Reported on 03/16/2020), Disp: , Rfl:   Allergies  Allergen Reactions  . Lisinopril Cough       . Shrimp [Shellfish Allergy] Swelling    throat swells (lobster also)  . Sulfa Antibiotics Swelling    Patient Care Team: Delsa Grana, PA-C as PCP - General (Family Medicine)  Review of Systems  Constitutional: Negative.   HENT: Negative.   Eyes: Negative.   Respiratory: Negative.   Cardiovascular: Negative.   Gastrointestinal:  Negative.   Endocrine: Negative.   Genitourinary: Negative.   Musculoskeletal: Negative.   Skin: Negative.   Allergic/Immunologic: Negative.   Neurological: Negative.   Hematological: Negative.   Psychiatric/Behavioral: Negative.   All other systems reviewed and are negative.    I personally reviewed active problem list, medication list, allergies, family history, social history, health maintenance, notes from last encounter, lab results, imaging with the patient/caregiver today.        Objective:   Vitals:  Vitals:   03/16/20 0808  BP: 128/84  Pulse: 76  Resp: 16  Temp: 98.5 F (36.9 C)  TempSrc: Oral  SpO2: 100%  Weight: 200 lb 6.4 oz (90.9 kg)  Height: _0  (1.676 m)    Body mass index is 32.35 kg/m.  Physical Exam Vitals and nursing note reviewed.  Constitutional:      General: She is not in acute distress.    Appearance: Normal appearance. She is well-developed. She is obese. She is not ill-appearing, toxic-appearing or diaphoretic.  HENT:     Head: Normocephalic and atraumatic.     Right Ear: Tympanic membrane, ear canal and external ear normal. There is no impacted cerumen.     Left Ear: Tympanic membrane, ear canal and external ear normal. There is no impacted cerumen.     Nose: Nose normal. No congestion or rhinorrhea.     Mouth/Throat:     Mouth: Mucous membranes are moist.     Pharynx: Oropharynx is clear. Uvula midline. No oropharyngeal exudate or posterior oropharyngeal erythema.  Eyes:     General: Lids are normal.  No scleral icterus.       Right eye: No discharge.        Left eye: No discharge.     Conjunctiva/sclera: Conjunctivae normal.  Neck:     Trachea: Phonation normal. No tracheal deviation.  Cardiovascular:     Rate and Rhythm: Normal rate and regular rhythm.     Pulses: Normal pulses.          Radial pulses are 2+ on the right side and 2+ on the left side.       Posterior tibial pulses are 2+ on the right side and 2+ on the left side.     Heart sounds: Normal heart sounds. No murmur heard.  No friction rub. No gallop.   Pulmonary:     Effort: Pulmonary effort is normal. No respiratory distress.     Breath sounds: Normal breath sounds. No stridor. No wheezing, rhonchi or rales.  Chest:     Chest wall: No tenderness.  Abdominal:     General: Bowel sounds are normal. There is no distension.     Palpations: Abdomen is soft.     Tenderness: There is no abdominal tenderness. There is no guarding or rebound.  Musculoskeletal:     Cervical back: Normal range of motion and neck supple.     Right lower leg: No edema.     Left lower leg: No edema.  Lymphadenopathy:     Cervical: No cervical adenopathy.  Skin:    General: Skin is warm and dry.     Coloration: Skin is not jaundiced or pale.     Findings: No rash.  Neurological:     Mental Status: She is alert. Mental status is at baseline.     Motor: No abnormal muscle tone.     Gait: Gait normal.  Psychiatric:        Mood and Affect: Mood normal.  Speech: Speech normal.        Behavior: Behavior normal.       Fall Risk: Fall Risk  03/16/2020 09/13/2019 03/16/2019 09/21/2018 08/26/2018  Falls in the past year? 0 0 0 0 0  Number falls in past yr: 0 0 0 - 0  Injury with Fall? 0 0 0 - 0  Follow up Falls evaluation completed - - - Falls evaluation completed    Functional Status Survey: Is the patient deaf or have difficulty hearing?: No Does the patient have difficulty seeing, even when wearing glasses/contacts?: No Does the  patient have difficulty concentrating, remembering, or making decisions?: No Does the patient have difficulty walking or climbing stairs?: No Does the patient have difficulty dressing or bathing?: No Does the patient have difficulty doing errands alone such as visiting a doctor's office or shopping?: No   Assessment & Plan:    CPE completed today  . USPSTF grade A and B recommendations reviewed with patient; age-appropriate recommendations, preventive care, screening tests, etc discussed and encouraged; healthy living encouraged; see AVS for patient education given to patient  . Discussed importance of 150 minutes of physical activity weekly, AHA exercise recommendations given to pt in AVS/handout  . Discussed importance of healthy diet:  eating lean meats and proteins, avoiding trans fats and saturated fats, avoid simple sugars and excessive carbs in diet, eat 6 servings of fruit/vegetables daily and drink plenty of water and avoid sweet beverages.    . Recommended pt to do annual eye exam and routine dental exams/cleanings  . Depression, alcohol, fall screening completed as documented above and per flowsheets  . Reviewed Health Maintenance: Health Maintenance  Topic Date Due  . DEXA SCAN  Never done  . INFLUENZA VACCINE  08/10/2020 (Originally 12/12/2019)  . PNA vac Low Risk Adult (1 of 2 - PCV13) 03/16/2021 (Originally 12/26/2019)  . MAMMOGRAM  09/28/2020  . TETANUS/TDAP  03/27/2025  . COLONOSCOPY  07/01/2027  . COVID-19 Vaccine  Completed  . Hepatitis C Screening  Completed  . HIV Screening  Completed  . PAP SMEAR-Modifier  Discontinued    . Immunizations: Immunization History  Administered Date(s) Administered  . Hep A / Hep B 04/14/2018, 05/22/2018  . Moderna SARS-COVID-2 Vaccination 07/21/2019, 08/18/2019, 03/09/2020  . Tdap 03/28/2015     Orders Placed This Encounter  Procedures  . DG Bone Density    Standing Status:   Future    Standing Expiration Date:    03/15/2021    Order Specific Question:   Reason for Exam (SYMPTOM  OR DIAGNOSIS REQUIRED)    Answer:   estrogen deficiency postmenopausal    Order Specific Question:   Preferred imaging location?    Answer:   [facility]  . CBC with Differential/Platelet  . COMPLETE METABOLIC PANEL WITH GFR  . Lipid panel    Order Specific Question:   Has the patient fasted?    Answer:   Yes  . Hemoglobin A1c     1. Adult general medical exam - CBC with Differential/Platelet - COMPLETE METABOLIC PANEL WITH GFR - Lipid panel - Hemoglobin A1c  2. Encounter for medication monitoring - CBC with Differential/Platelet - COMPLETE METABOLIC PANEL WITH GFR - Lipid panel - Hemoglobin A1c  3. Postmenopausal estrogen deficiency Discussed osteoporosis prevention, handout given, encouraged to start calcium 1200 mg and Vit D 3 1000 IU daily - DG Bone Density; Future  4. Need for pneumococcal vaccination Declined, just had COVID booster last week  5. Mixed hyperlipidemia Compliant  with meds, doing well, lipids checked in May, will recheck at next routine OV - COMPLETE METABOLIC PANEL WITH GFR - Hemoglobin A1c  6. Insomnia, unspecified type Still sx, mostly related to stress, does not want meds, nothing in the past was helpful due to SE   7. Moderate episode of recurrent major depressive disorder (HCC) Refused to do PHQ, still struggling with stress/anxiety, she denies SI  8. Prediabetes Suspects sugars may be higher, not taking metformin, encouraged her to start taking daily - COMPLETE METABOLIC PANEL WITH GFR - Hemoglobin A1c  9. Primary hypertension Stable, well controlled on norvasc and losartan - COMPLETE METABOLIC PANEL WITH GFR      Delsa Grana, PA-C 03/16/20 8:29 AM  [facility]

## 2020-03-15 NOTE — Patient Instructions (Addendum)
Health Maintenance  Topic Date Due  . DEXA scan (bone density measurement)  Never done  . Flu Shot  08/10/2020*  . Pneumonia vaccines (1 of 2 - PCV13) 03/16/2021*  . Mammogram  09/28/2020  . Tetanus Vaccine  03/27/2025  . Colon Cancer Screening  07/01/2027  . COVID-19 Vaccine  Completed  .  Hepatitis C: One time screening is recommended by Center for Disease Control  (CDC) for  adults born from [birth year] through 1966.   Completed  . HIV Screening  Completed  . Pap Smear  Discontinued  *Topic was postponed. The date shown is not the original due date.     Preventive Care 23 Years and Older, Female Preventive care refers to lifestyle choices and visits with your health care provider that can promote health and wellness. This includes:  A yearly physical exam. This is also called an annual well check.  Regular dental and eye exams.  Immunizations.  Screening for certain conditions.  Healthy lifestyle choices, such as diet and exercise. What can I expect for my preventive care visit? Physical exam Your health care provider will check:  Height and weight. These may be used to calculate body mass index (BMI), which is a measurement that tells if you are at a healthy weight.  Heart rate and blood pressure.  Your skin for abnormal spots. Counseling Your health care provider may ask you questions about:  Alcohol, tobacco, and drug use.  Emotional well-being.  Home and relationship well-being.  Sexual activity.  Eating habits.  History of falls.  Memory and ability to understand (cognition).  Work and work Statistician.  Pregnancy and menstrual history. What immunizations do I need?  Influenza (flu) vaccine  This is recommended every year. Tetanus, diphtheria, and pertussis (Tdap) vaccine  You may need a Td booster every 10 years. Varicella (chickenpox) vaccine  You may need this vaccine if you have not already been vaccinated.  Zoster (shingles) vaccine  You  may need this after age 60. Pneumococcal conjugate (PCV13) vaccine  One dose is recommended after age 46. Pneumococcal polysaccharide (PPSV23) vaccine  One dose is recommended after age 31.  Measles, mumps, and rubella (MMR) vaccine  You may need at least one dose of MMR if you were born in 1957 or later. You may also need a second dose. Meningococcal conjugate (MenACWY) vaccine  You may need this if you have certain conditions. Hepatitis A vaccine  You may need this if you have certain conditions or if you travel or work in places where you may be exposed to hepatitis A. Hepatitis B vaccine  You may need this if you have certain conditions or if you travel or work in places where you may be exposed to hepatitis B. Haemophilus influenzae type b (Hib) vaccine  You may need this if you have certain conditions. You may receive vaccines as individual doses or as more than one vaccine together in one shot (combination vaccines). Talk with your health care provider about the risks and benefits of combination vaccines. What tests do I need? Blood tests  Lipid and cholesterol levels. These may be checked every 5 years, or more frequently depending on your overall health.  Hepatitis C test.  Hepatitis B test. Screening  Lung cancer screening. You may have this screening every year starting at age 32 if you have a 30-pack-year history of smoking and currently smoke or have quit within the past 15 years.  Colorectal cancer screening. All adults should have this screening  starting at age 26 and continuing until age 24. Your health care provider may recommend screening at age 58 if you are at increased risk. You will have tests every 1-10 years, depending on your results and the type of screening test.  Diabetes screening. This is done by checking your blood sugar (glucose) after you have not eaten for a while (fasting). You may have this done every 1-3 years.  Mammogram. This may be done  every 1-2 years. Talk with your health care provider about how often you should have regular mammograms.  BRCA-related cancer screening. This may be done if you have a family history of breast, ovarian, tubal, or peritoneal cancers. Other tests  Sexually transmitted disease (STD) testing.  Bone density scan. This is done to screen for osteoporosis. You may have this done starting at age 86. Follow these instructions at home: Eating and drinking  Eat a diet that includes fresh fruits and vegetables, whole grains, lean protein, and low-fat dairy products. Limit your intake of foods with high amounts of sugar, saturated fats, and salt.  Take vitamin and mineral supplements as recommended by your health care provider.  Do not drink alcohol if your health care provider tells you not to drink.  If you drink alcohol: ? Limit how much you have to 0-1 drink a day. ? Be aware of how much alcohol is in your drink. In the [place] one drink equals one 12 oz bottle of beer (355 mL), one 5 oz glass of wine (148 mL), or one 1 oz glass of hard liquor (44 mL). Lifestyle  Take daily care of your teeth and gums.  Stay active. Exercise for at least 30 minutes on 5 or more days each week.  Do not use any products that contain nicotine or tobacco, such as cigarettes, e-cigarettes, and chewing tobacco. If you need help quitting, ask your health care provider.  If you are sexually active, practice safe sex. Use a condom or other form of protection in order to prevent STIs (sexually transmitted infections).  Talk with your health care provider about taking a low-dose aspirin or statin. What's next?  Go to your health care provider once a year for a well check visit.  Ask your health care provider how often you should have your eyes and teeth checked.  Stay up to date on all vaccines. This information is not intended to replace advice given to you by your health care provider. Make sure you discuss any  questions you have with your health care provider. Document Revised: 04/23/2018 Document Reviewed: 04/23/2018 Elsevier Patient Education  2020 [place].    Preventing Osteoporosis, Adult Osteoporosis is a condition that causes the bones to lose density. This means that the bones become thinner, and the normal spaces in bone tissue become larger. Low bone density can make the bones weak and cause them to break more easily. Osteoporosis cannot always be prevented, but you can take steps to lower your risk of developing this condition. How can this condition affect me? If you develop osteoporosis, you will be more likely to break bones in your wrist, spine, or hip. Even a minor accident or injury can be enough to break weak bones. The bones will also be slower to heal. Osteoporosis can cause other problems as well, such as a stooped posture or trouble with movement. Osteoporosis can occur with aging. As you get older, you may lose bone tissue more quickly, or it may be replaced more slowly. Osteoporosis is  more likely to develop if you have poor nutrition or do not get enough calcium or vitamin D. Other lifestyle factors can also play a role. By eating a well-balanced diet and making lifestyle changes, you can help keep your bones strong and healthy, lowering your chances of developing osteoporosis. What can increase my risk? The following factors may make you more likely to develop osteoporosis:  Having a family history of the condition.  Having poor nutrition or not getting enough calcium or vitamin D.  Using certain medicines, such as steroid medicines or antiseizure medicines.  Being any of the following: ? 26 years of age or older. ? Female. ? A woman who has gone through menopause (is postmenopausal). ? White (Caucasian) or of Asian descent.  Smoking or having a history of smoking.  Not being physically active (being sedentary).  Having a small body frame. What actions can I  take to prevent this?  Get enough calcium   Make sure you get enough calcium every day. Calcium is the most important mineral for bone health. Most people can get enough calcium from their diet, but supplements may be recommended for people who are at risk for osteoporosis. Follow these guidelines: ? If you are age 68 or younger, aim to get 1,000 mg of calcium every day. ? If you are older than age 2, aim to get 1,200 mg of calcium every day.  Good sources of calcium include: ? Dairy products, such as low-fat or nonfat milk, cheese, and yogurt. ? Dark green leafy vegetables, such as bok choy and broccoli. ? Foods that have had calcium added to them (calcium-fortified foods), such as orange juice, cereal, bread, soy beverages, and tofu products. ? Nuts, such as almonds.  Check nutrition labels to see how much calcium is in a food or drink. Get enough vitamin D  Try to get enough vitamin D every day. Vitamin D is the most essential vitamin for bone health. It helps the body absorb calcium. Follow these guidelines for how much vitamin D to get from food: ? If you are age 32 or younger, aim to get at least 600 international units (IU) every day. Your health care provider may suggest more. ? If you are older than age 20, aim to get at least 800 international units every day. Your health care provider may suggest more.  Good sources of vitamin D in your diet include: ? Egg yolks. ? Oily fish, such as salmon, sardines, and tuna. ? Milk and cereal fortified with vitamin D.  Your body also makes vitamin D when you are out in the sun. Exposing the bare skin on your face, arms, legs, or back to the sun for no more than 30 minutes a day, 2 times a week is more than enough. Beyond that, make sure you use sunblock to protect your skin from sunburn, which increases your risk for skin cancer. Exercise  Stay active and get exercise every day.  Ask your health care provider what types of exercise are  best for you. Weight-bearing and strength-building activities are important for building and maintaining healthy bones. Some examples of these types of activities include: ? Walking and hiking. ? Jogging and running. ? Dancing. ? Gym exercises. ? Lifting weights. ? Tennis and racquetball. ? Climbing stairs. ? Aerobics. Make other lifestyle changes  Do not use any products that contain nicotine or tobacco, such as cigarettes, e-cigarettes, and chewing tobacco. If you need help quitting, ask your health care provider.  Lose weight if you are overweight.  If you drink alcohol: ? Limit how much you use to:  0-1 drink a day for nonpregnant women.  0-2 drinks a day for men. ? Be aware of how much alcohol is in your drink. In the [place] one drink equals one 12 oz bottle of beer (355 mL), one 5 oz glass of wine (148 mL), or one 1 oz glass of hard liquor (44 mL). Where to find support If you need help making changes to prevent osteoporosis, talk with your health care provider. You can ask for a referral to a diet and nutrition specialist (dietitian) and a physical therapist. Where to find more information Learn more about osteoporosis from:  NIH Osteoporosis and Related [place]: www.bones.SouthExposed.es  [place] Office on [employer]: [place]  [place]: EquipmentWeekly.com.ee Summary  Osteoporosis is a condition that causes weak bones that are more likely to break.  Eat a healthy diet, making sure you get enough calcium and vitamin D, and stay active by getting regular exercise to help prevent osteoporosis.  Other ways to reduce your risk of osteoporosis include maintaining a healthy weight and avoiding alcohol and products that contain nicotine or tobacco. This information is not intended to replace advice given to you by your health care provider. Make sure you discuss any questions you have with your health care provider. Document  Revised: 11/27/2018 Document Reviewed: 11/27/2018 Elsevier Patient Education  [place].    Urinary Frequency, Adult Urinary frequency means urinating more often than usual. You may urinate every 1-2 hours even though you drink a normal amount of fluid and do not have a bladder infection or condition. Although you urinate more often than normal, the total amount of urine produced in a day is normal. With urinary frequency, you may have an urgent need to urinate often. The stress and anxiety of needing to find a bathroom quickly can make this urge worse. This condition may go away on its own or you may need treatment at home. Home treatment may include bladder training, exercises, taking medicines, or making changes to your diet. Follow these instructions at home: Bladder health   Keep a bladder diary if told by your health care provider. Keep track of: ? What you eat and drink. ? How often you urinate. ? How much you urinate.  Follow a bladder training program if told by your health care provider. This may include: ? Learning to delay going to the bathroom. ? Double urinating (voiding). This helps if you are not completely emptying your bladder. ? Scheduled voiding.  Do Kegel exercises as told by your health care provider. Kegel exercises strengthen the muscles that help control urination, which may help the condition. Eating and drinking  If told by your health care provider, make diet changes, such as: ? Avoiding caffeine. ? Drinking fewer fluids, especially alcohol. ? Not drinking in the evening. ? Avoiding foods or drinks that may irritate the bladder. These include coffee, tea, soda, artificial sweeteners, citrus, tomato-based foods, and chocolate. ? Eating foods that help prevent or ease constipation. Constipation can make this condition worse. Your health care provider may recommend that you:  Drink enough fluid to keep your urine pale yellow.  Take over-the-counter  or prescription medicines.  Eat foods that are high in fiber, such as beans, whole grains, and fresh fruits and vegetables.  Limit foods that are high in fat and processed sugars, such as fried or sweet foods.  General instructions  Take over-the-counter and prescription medicines only as told by your health care provider.  Keep all follow-up visits as told by your health care provider. This is important. Contact a health care provider if:  You start urinating more often.  You feel pain or irritation when you urinate.  You notice blood in your urine.  Your urine looks cloudy.  You develop a fever.  You begin vomiting. Get help right away if:  You are unable to urinate. Summary  Urinary frequency means urinating more often than usual. With urinary frequency, you may urinate every 1-2 hours even though you drink a normal amount of fluid and do not have a bladder infection or other bladder condition.  Your health care provider may recommend that you keep a bladder diary, follow a bladder training program, or make dietary changes.  If told by your health care provider, do Kegel exercises to strengthen the muscles that help control urination.  Take over-the-counter and prescription medicines only as told by your health care provider.  Contact a health care provider if your symptoms do not improve or get worse. This information is not intended to replace advice given to you by your health care provider. Make sure you discuss any questions you have with your health care provider. Document Revised: 11/06/2017 Document Reviewed: 11/06/2017 Elsevier Patient Education  [place].

## 2020-03-16 ENCOUNTER — Encounter: Payer: Self-pay | Admitting: Family Medicine

## 2020-03-16 ENCOUNTER — Ambulatory Visit (INDEPENDENT_AMBULATORY_CARE_PROVIDER_SITE_OTHER): Payer: Medicare Other | Admitting: Family Medicine

## 2020-03-16 ENCOUNTER — Other Ambulatory Visit: Payer: Self-pay

## 2020-03-16 VITALS — BP 128/84 | HR 76 | Temp 98.5°F | Resp 16 | Ht 66.0 in | Wt 200.4 lb

## 2020-03-16 DIAGNOSIS — Z780 Asymptomatic menopausal state: Secondary | ICD-10-CM | POA: Diagnosis not present

## 2020-03-16 DIAGNOSIS — Z23 Encounter for immunization: Secondary | ICD-10-CM | POA: Diagnosis not present

## 2020-03-16 DIAGNOSIS — R7303 Prediabetes: Secondary | ICD-10-CM

## 2020-03-16 DIAGNOSIS — Z5181 Encounter for therapeutic drug level monitoring: Secondary | ICD-10-CM

## 2020-03-16 DIAGNOSIS — Z0000 Encounter for general adult medical examination without abnormal findings: Secondary | ICD-10-CM

## 2020-03-16 DIAGNOSIS — F331 Major depressive disorder, recurrent, moderate: Secondary | ICD-10-CM

## 2020-03-16 DIAGNOSIS — E782 Mixed hyperlipidemia: Secondary | ICD-10-CM

## 2020-03-16 DIAGNOSIS — G4700 Insomnia, unspecified: Secondary | ICD-10-CM

## 2020-03-16 DIAGNOSIS — I10 Essential (primary) hypertension: Secondary | ICD-10-CM

## 2020-03-17 LAB — CBC WITH DIFFERENTIAL/PLATELET
Absolute Monocytes: 567 cells/uL (ref 200–950)
Basophils Absolute: 43 cells/uL (ref 0–200)
Basophils Relative: 0.7 %
Eosinophils Absolute: 299 cells/uL (ref 15–500)
Eosinophils Relative: 4.9 %
HCT: 36.5 % (ref 35.0–45.0)
Hemoglobin: 12 g/dL (ref 11.7–15.5)
Lymphs Abs: 1848 cells/uL (ref 850–3900)
MCH: 27.7 pg (ref 27.0–33.0)
MCHC: 32.9 g/dL (ref 32.0–36.0)
MCV: 84.3 fL (ref 80.0–100.0)
MPV: 10.3 fL (ref 7.5–12.5)
Monocytes Relative: 9.3 %
Neutro Abs: 3343 cells/uL (ref 1500–7800)
Neutrophils Relative %: 54.8 %
Platelets: 411 10*3/uL — ABNORMAL HIGH (ref 140–400)
RBC: 4.33 10*6/uL (ref 3.80–5.10)
RDW: 14 % (ref 11.0–15.0)
Total Lymphocyte: 30.3 %
WBC: 6.1 10*3/uL (ref 3.8–10.8)

## 2020-03-17 LAB — COMPLETE METABOLIC PANEL WITH GFR
AG Ratio: 1.3 (calc) (ref 1.0–2.5)
ALT: 15 U/L (ref 6–29)
AST: 16 U/L (ref 10–35)
Albumin: 4.1 g/dL (ref 3.6–5.1)
Alkaline phosphatase (APISO): 152 U/L (ref 37–153)
BUN: 11 mg/dL (ref 7–25)
CO2: 27 mmol/L (ref 20–32)
Calcium: 10 mg/dL (ref 8.6–10.4)
Chloride: 105 mmol/L (ref 98–110)
Creat: 0.92 mg/dL (ref 0.50–0.99)
GFR, Est African American: 76 mL/min/{1.73_m2} (ref >=60)
GFR, Est Non African American: 65 mL/min/{1.73_m2} (ref >=60)
Globulin: 3.2 g/dL (calc) (ref 1.9–3.7)
Glucose, Bld: 112 mg/dL — ABNORMAL HIGH (ref 65–99)
Potassium: 4.3 mmol/L (ref 3.5–5.3)
Sodium: 140 mmol/L (ref 135–146)
Total Bilirubin: 0.3 mg/dL (ref 0.2–1.2)
Total Protein: 7.3 g/dL (ref 6.1–8.1)

## 2020-03-17 LAB — LIPID PANEL
Cholesterol: 145 mg/dL (ref <200)
HDL: 46 mg/dL — ABNORMAL LOW (ref >=50)
LDL Cholesterol (Calc): 82 mg/dL (calc)
Non-HDL Cholesterol (Calc): 99 mg/dL (calc) (ref <130)
Total CHOL/HDL Ratio: 3.2 (calc) (ref <5.0)
Triglycerides: 87 mg/dL (ref <150)

## 2020-03-17 LAB — HEMOGLOBIN A1C
Hgb A1c MFr Bld: 6.4 % of total Hgb — ABNORMAL HIGH (ref <5.7)
Mean Plasma Glucose: 137 (calc)
eAG (mmol/L): 7.6 (calc)

## 2020-04-12 ENCOUNTER — Ambulatory Visit: Payer: Self-pay | Admitting: *Deleted

## 2020-04-12 NOTE — Telephone Encounter (Signed)
Patient is calling to report she was turning over in bed and felt/heard a "pop" in the groin area- pain is so bad she is using walker so she does not have to put pain on leg. Reason for Disposition  Can't stand (bear weight) or walk  Answer Assessment - Initial Assessment Questions 1. ONSET: "When did the muscle aches or body pains start?"      Yesterday am- 8 am 2. LOCATION: "What part of your body is hurting?" (e.g., entire body, arms, legs)      R leg- groin/hip 3. SEVERITY: "How bad is the pain?" (Scale 1-10; or mild, moderate, severe)   - MILD (1-3): doesn't interfere with normal activities    - MODERATE (4-7): interferes with normal activities or awakens from sleep    - SEVERE (8-10):  excruciating pain, unable to do any normal activities      If stands- 10 4. CAUSE: "What do you think is causing the pains?"     Pulled muscle 5. FEVER: "Have you been having fever?"     no 6. OTHER SYMPTOMS: "Do you have any other symptoms?" (e.g., chest pain, weakness, rash, cold or flu symptoms, weight loss)     no 7. PREGNANCY: "Is there any chance you are pregnant?" "When was your last menstrual period?"     n/a 8. TRAVEL: "Have you traveled out of the country in the last month?" (e.g., travel history, exposures)     no  Answer Assessment - Initial Assessment Questions 1. MECHANISM: "How did the injury happen?" (e.g., twisting injury, direct blow)      Turning over in bed and felt pop 2. ONSET: "When did the injury happen?" (Minutes or hours ago)      Yesterday morning 3. LOCATION: "Where is the injury located?"      R leg- groin area 4. APPEARANCE of INJURY: "What does the injury look like?"  (e.g., deformity of leg)     No swelling seen 5. SEVERITY: "Can you put weight on that leg?" "Can you walk?"      Hurts to put weight on- using walker 6. SIZE: For cuts, bruises, or swelling, ask: "How large is it?" (e.g., inches or centimeters)      n/a 7. PAIN: "Is there pain?" If Yes, ask:  "How bad is the pain?"  (Scale 1-10; or mild, moderate, severe)     Moderate / severe 8. TETANUS: For any breaks in the skin, ask: "When was the last tetanus booster?"     n/a 9. OTHER SYMPTOMS: "Do you have any other symptoms?"      no 10. PREGNANCY: "Is there any chance you are pregnant?" "When was your last menstrual period?"       n/a  Protocols used: LEG INJURY-A-AH, MUSCLE ACHES AND BODY PAIN-A-AH

## 2020-04-13 NOTE — Telephone Encounter (Signed)
FYI

## 2020-04-13 NOTE — Telephone Encounter (Signed)
We can do outpt imaging - or UC would be appropriate for this complaint

## 2020-05-03 ENCOUNTER — Encounter: Payer: Self-pay | Admitting: Family

## 2020-05-03 ENCOUNTER — Other Ambulatory Visit: Payer: Self-pay

## 2020-05-03 ENCOUNTER — Ambulatory Visit: Payer: Medicare Other | Admitting: Family

## 2020-05-03 VITALS — BP 128/90 | HR 65 | Ht 66.0 in | Wt 207.0 lb

## 2020-05-03 DIAGNOSIS — E782 Mixed hyperlipidemia: Secondary | ICD-10-CM | POA: Diagnosis not present

## 2020-05-03 DIAGNOSIS — F439 Reaction to severe stress, unspecified: Secondary | ICD-10-CM

## 2020-05-03 DIAGNOSIS — R0789 Other chest pain: Secondary | ICD-10-CM

## 2020-05-03 DIAGNOSIS — I10 Essential (primary) hypertension: Secondary | ICD-10-CM | POA: Diagnosis not present

## 2020-05-03 NOTE — Progress Notes (Signed)
Office Visit    Patient Name: [name] Date of Encounter: 05/03/2020  Primary Care Provider:  Delsa Grana, PA-C Primary Cardiologist:  Ida Rogue, MD Electrophysiologist:  None   Chief Complaint    [name] is a 64 y.o. female with a hx of hypertension, pituitary adenoma s/p resection, atypical chest pain with chronic calcium score of 07/2019, anemia, hyperlipidemia, prediabetes, depression, sleep apnea not on CPAP presents today for chest pain  Past Medical History    Past Medical History:  Diagnosis Date  . Anemia   . Benign hypertensive renal disease   . Benign hypertensive renal disease   . DDD (degenerative disc disease), lumbar   . Elevated alkaline phosphatase level   . Fibrocystic disease of both breasts   . GERD (gastroesophageal reflux disease)   . Hyperlipidemia   . Hypertension   . Impaired fasting glucose   . Impaired fasting glucose   . Insomnia   . Menopausal state   . Migraine   . Osteoarthritis   . 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 Perforation of nasal septum   . Pre-diabetes   . Recurrent major depression ([place])   . Sleep apnea    Does not use CPAP  . Spondylosis   . Thrombocytosis 10/01/2015   mild  . Wears dentures    full upper   Past Surgical History:  Procedure Laterality Date  . ABDOMINAL HYSTERECTOMY     partial with bladder suspension, no cervix  . CHOLECYSTECTOMY  2003  . COLONOSCOPY  2008   1 polyp- return 10 years  . COLONOSCOPY WITH PROPOFOL N/A 06/30/2017   Procedure: COLONOSCOPY WITH PROPOFOL;  Surgeon: Lucilla Lame, MD;  Location: [facility];  Service: Endoscopy;  Laterality: N/A;  slep apnea  . KNEE SURGERY Right 2014  . PITUITARY SURGERY     x2 30+ years ago  . transnasal approach pituitary adenoma resection  2015   clival/parasellar mass    Allergies  Allergies  Allergen Reactions  . Lisinopril Cough       . Shrimp [Shellfish Allergy] Swelling    throat swells (lobster also)  . Sulfa  Antibiotics Swelling    History of Present Illness    [name] is a 64 y.o. female with a hx of hypertension, pituitary adenoma s/p resection, atypical chest pain with chronic calcium score of 07/2019, anemia, hyperlipidemia, prediabetes, depression, sleep apnea not on CPAP last seen 07/12/2019 by Dr. Rockey Situ.  She has history of atypical chest pain at rest with some tingling down left arm concerning for cervical radiculopathy. Pain is mostly associated with stress. March 2021 she had coronary calcium score of 0.  Lab work 03/16/2020 with her primary care provider showed cholesterol panel with LDL 82, hemoglobin 12, A1c 6.4, normal renal and liver function.  Reports she occasionally gets sensation of muscle tightness. It happens in her arms and leg, but also notices it happens occasionally in her chest which worries her. She had episode last week of chest discomfort and repeat episode yesterday.  Tells me the night before she had had pizza for dinner and had discomfort the next morning.  She wonders if indigestion can be contributory. Tells me yesterday's episode lasted abut 10 minutes.  It felt like a cramping sensation.  It was most relieved by moving her arms and stretching.   EKGs/Labs/Other Studies Reviewed:   The following studies were reviewed today:  Cardiac calcium score 07/23/19  FINDINGS: Non-cardiac: See separate report  from [facility].   Ascending Aorta: Normal caliber.   Pericardium: Normal.   Coronary arteries: Normal origins.   IMPRESSION: Coronary calcium score of 0.   EKG:  EKG is  ordered today.  The ekg ordered today demonstrates NSR 65 bpm with no acute St/T wave changes.   Recent Labs: 03/16/2020: ALT 15; BUN 11; Creat 0.92; Hemoglobin 12.0; Platelets 411; Potassium 4.3; Sodium 140  Recent Lipid Panel    Component Value Date/Time   CHOL 145 03/16/2020 0852   CHOL 155 02/06/2016 0852   TRIG 87 03/16/2020 0852   HDL 46 (L) 03/16/2020 0852    HDL 48 02/06/2016 0852   CHOLHDL 3.2 03/16/2020 0852   LDLCALC 82 03/16/2020 0852   Home Medications   Current Meds  Medication Sig  . amLODipine (NORVASC) 10 MG tablet TAKE 1 TABLET BY MOUTH EVERY DAY FOR BLOOD PRESSURE  . atorvastatin (LIPITOR) 20 MG tablet Take 1 tablet (20 mg total) by mouth at bedtime. Patient needs an appointment for further refills.  . cholecalciferol (VITAMIN D3) 25 MCG (1000 UT) tablet Take 1,000 Units by mouth daily.  . fluticasone (FLONASE) 50 MCG/ACT nasal spray PLACE 2 SPRAYS INTO BOTH NOSTRILS AS NEEDED.  . furosemide (LASIX) 20 MG tablet PLEASE SEE ATTACHED FOR DETAILED DIRECTIONS  . losartan (COZAAR) 100 MG tablet Take 1 tablet (100 mg total) by mouth daily.  . metFORMIN (GLUCOPHAGE) 500 MG tablet Take 1 tablet (500 mg total) by mouth every other day.  . polyethylene glycol powder (GLYCOLAX/MIRALAX) powder as needed.      Review of Systems  All other systems reviewed and are otherwise negative except as noted above.  Physical Exam    VS:  BP 128/90 (BP Location: Left Arm, Patient Position: Sitting, Cuff Size: Normal)   Pulse 65   Ht 5\' 6"  (1.676 m)   Wt 207 lb (93.9 kg)   SpO2 97%   BMI 33.41 kg/m  , BMI Body mass index is 33.41 kg/m.  Wt Readings from Last 3 Encounters:  05/03/20 207 lb (93.9 kg)  03/16/20 200 lb 6.4 oz (90.9 kg)  09/13/19 205 lb 9.6 oz (93.3 kg)     GEN: Well nourished, well developed, in no acute distress. HEENT: normal. Neck: Supple, no JVD, carotid bruits, or masses. Cardiac: RRR, no murmurs, rubs, or gallops. No clubbing, cyanosis, edema.  Radials/DP/PT 2+ and equal bilaterally.  Respiratory:  Respirations regular and unlabored, clear to auscultation bilaterally. GI: Soft, nontender, nondistended. MS: No deformity or atrophy.  Chest wall tender on palpation. Skin: Warm and dry, no rash. Neuro:  Strength and sensation are intact. Anxious, tearful. Psych: Normal affect.  Assessment & Plan    1. Chest pain-07/2019  coronary calcium score of 0.  Reports episode of chest discomfort yesterday.  It occurred at rest and had sensation of cramping.  It relieved with rest and gentle stretching.  Her chest wall is tender on palpation.  She wonders if indigestion may be contributory as she had eaten pizza the night before.  We discussed etiology likely musculoskeletal and indigestion.  Reassurance provided that her symptoms are atypical for angina.  EKG today NSR with no acute ST/T wave changes.  Recommend 3-day course of ibuprofen 400 mg twice daily and 2-week course of Prilosec.  2. HTN- BP well controlled. Continue current antihypertensive regimen as directed by primary care provider.  3. HLD-LDL at goal of less than 100.  Continue atorvastatin 20 mg daily.  4. 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  She was tearful during our conversation today.  We discussed that stress can play a role in chest discomfort.  Encouraged to reach out to support systems and consider counseling services.   Disposition: Follow up prn with Dr. Rockey Situ or APP  Signed, Loel Dubonnet, NP 05/03/2020, 8:27 AM [place]

## 2020-05-03 NOTE — Patient Instructions (Addendum)
Medication Instructions:  Your physician has recommended you make the following change in your medication:   Recommend taking over the counter (OTC)  Ibuprofen 400mg  twice daily for 3 days.   Recommend Omeprazole (Prilosec) once daily for 2 weeks.   *If you need a refill on your cardiac medications before your next appointment, please call your pharmacy*  Lab Work: None ordered today.   Testing/Procedures: Your EKG today shows normal sinus rhythm which is a great result.   Follow-Up: At [facility], you and your health needs are our priority.  As part of our continuing mission to provide you with exceptional heart care, we have created designated Provider Care Teams.  These Care Teams include your primary Cardiologist (physician) and Advanced Practice Providers (APPs -  Physician Assistants and Nurse Practitioners) who all work together to provide you with the care you need, when you need it.  We recommend signing up for the patient portal called "MyChart".  Sign up information is provided on this After Visit Summary.  MyChart is used to connect with patients for Virtual Visits (Telemedicine).  Patients are able to view lab/test results, encounter notes, upcoming appointments, etc.  Non-urgent messages can be sent to your provider as well.   To learn more about what you can do with MyChart, go to [email or web address].    Your next appointment:   As needed  The format for your next appointment:   In Person  Provider:   You may see one of the following Advanced Practice Providers on your designated Care Team:    Murray Hodgkins, NP  Christell Faith, PA-C  Marrianne Mood, PA-C  Cadence Kathlen Mody, [place]  Laurann Montana, NP  Other Instructions  Your chest pain is likely musculoskeletal and from indigestion.   Your EKG shows no signs of blockages and your CT scan in March told us there was no plaque in your heart arteries.   Musculoskeletal pain is typically relieved by  ibuprofen and heat.

## 2020-06-16 IMAGING — MG DIGITAL SCREENING BILAT W/ TOMO W/ CAD
8 series · 8 of 24 positions shown · non-contrast
Comparison: Previous exam(s).

CLINICAL DATA: Screening.

EXAM:
DIGITAL SCREENING BILATERAL MAMMOGRAM WITH TOMO AND CAD

[L MLO synth-2D]
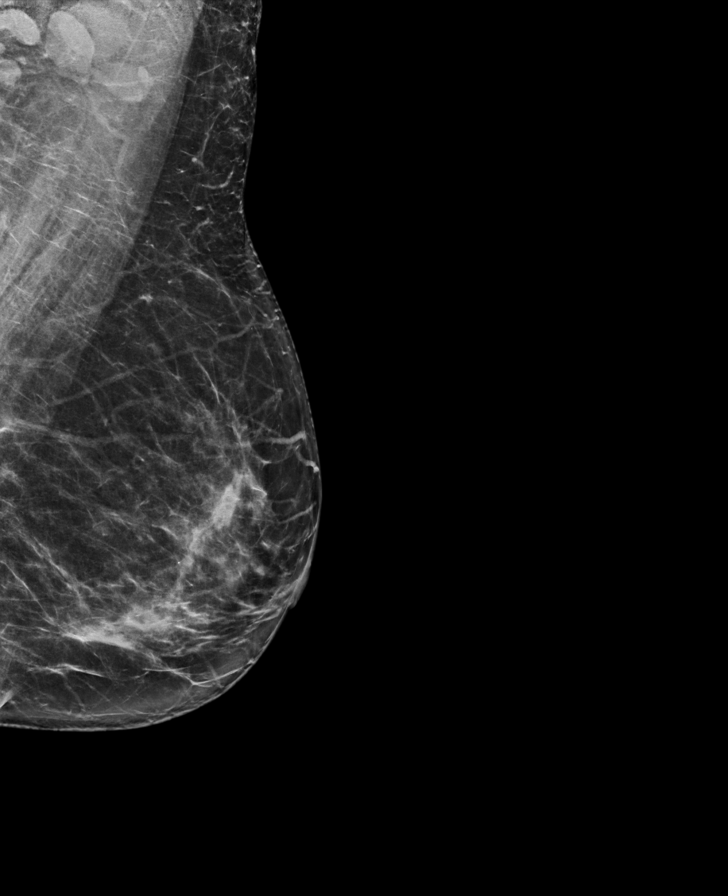

[L CC synth-2D]
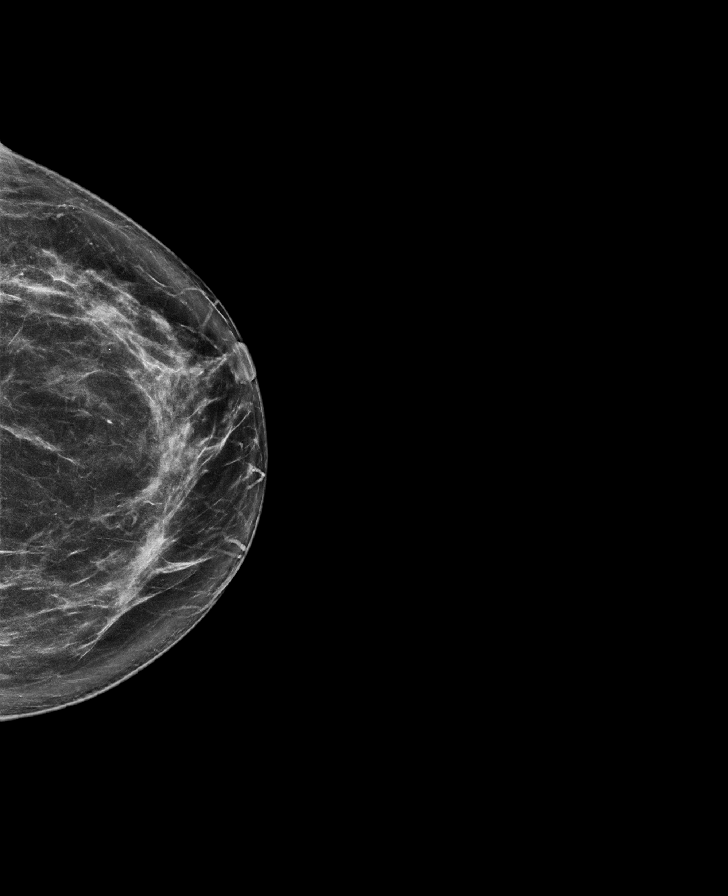

[R CC synth-2D]
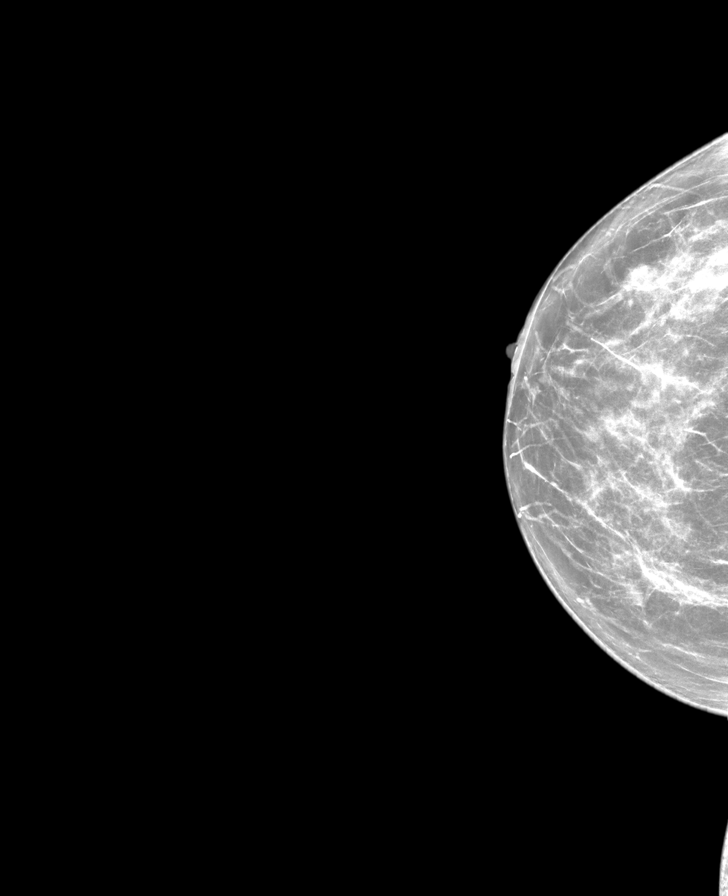

[R MLO synth-2D]
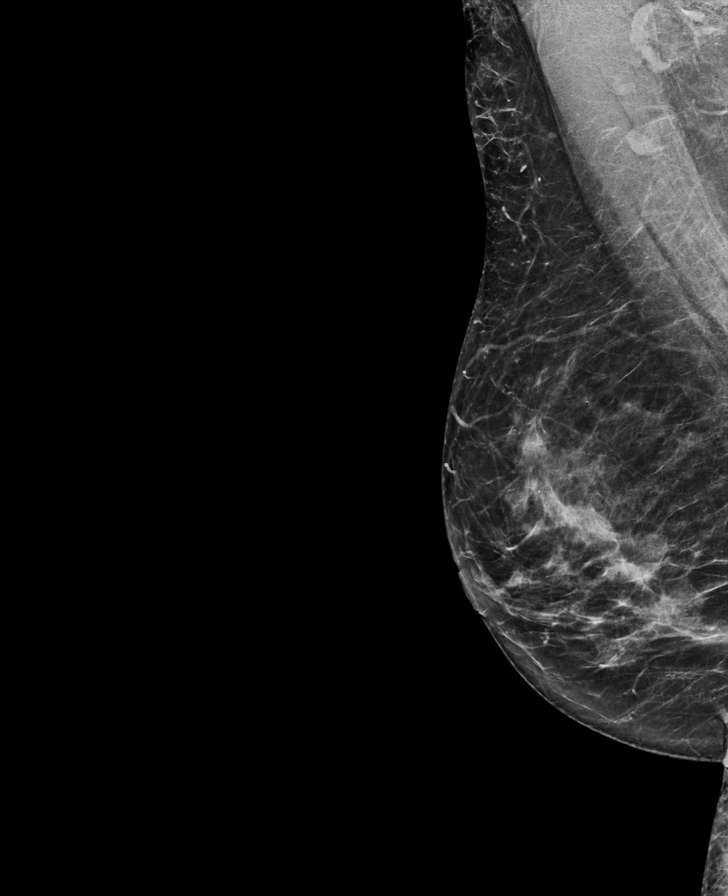

[L CC tomo · tomo slice 40/79.0]
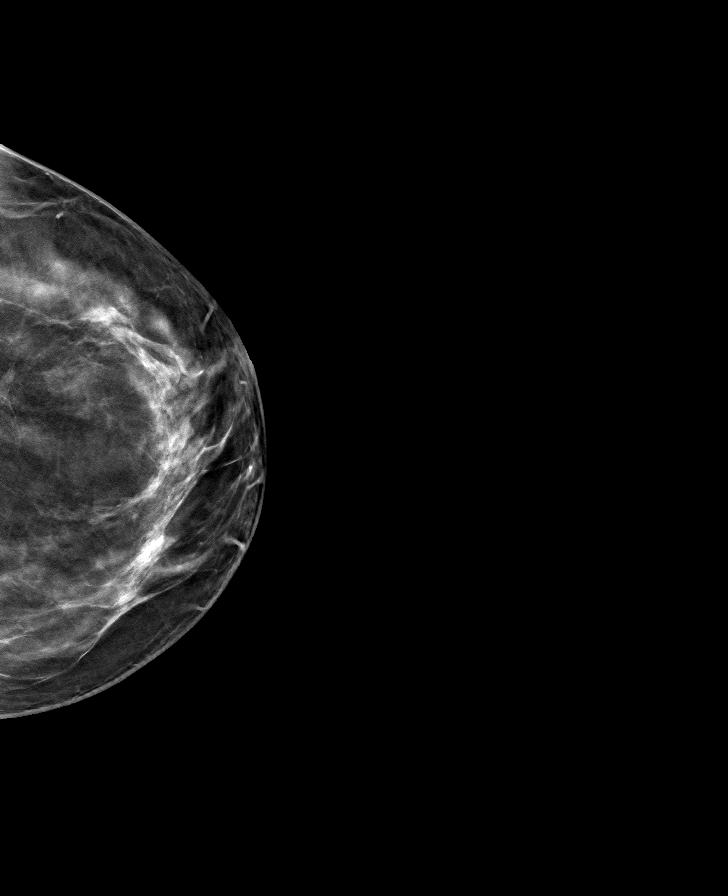

[R CC tomo · tomo slice 36/71.0]
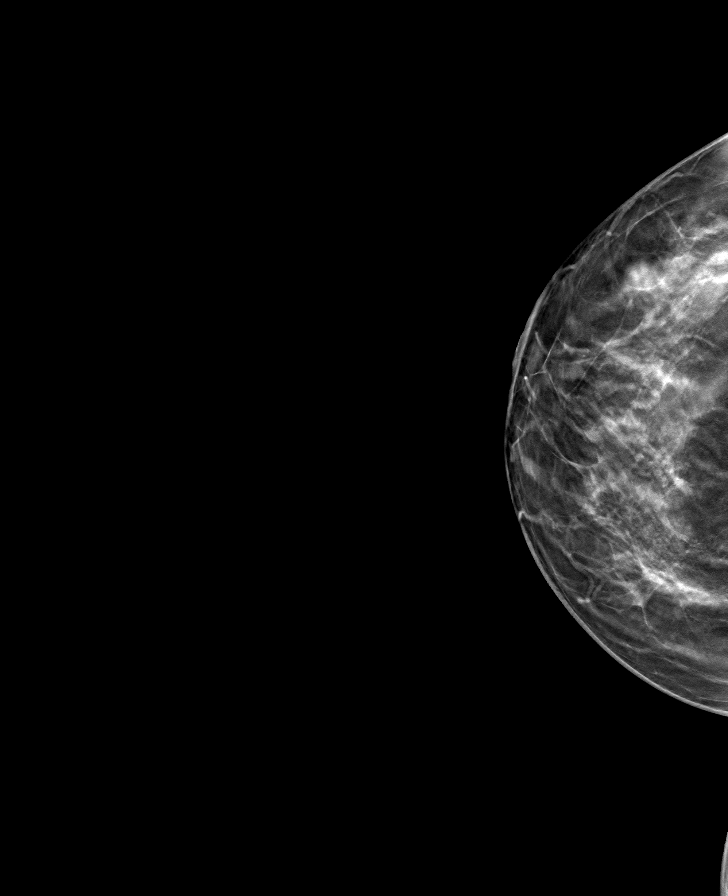

[R MLO tomo · tomo slice 38/75.0]
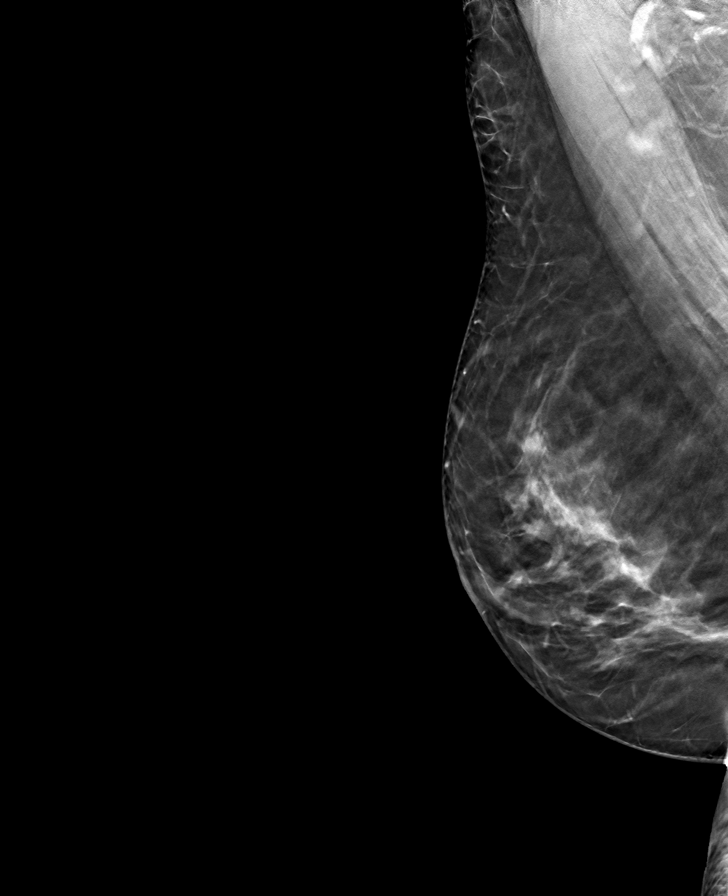

[L MLO tomo · tomo slice 39/77.0]
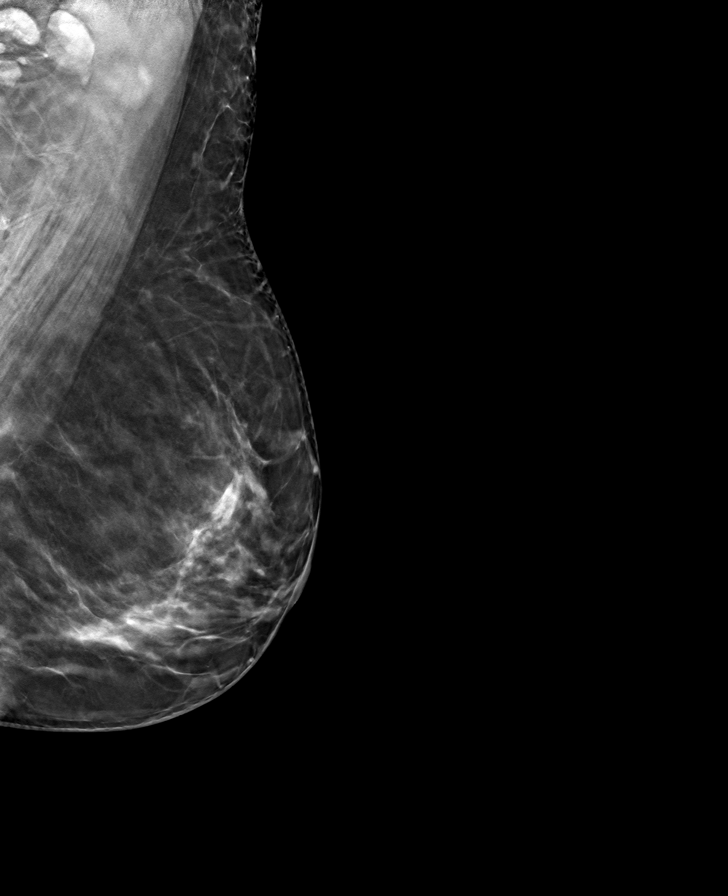

[8 of 24 positions shown; findings below may reference images not displayed]

ACR Breast Density Category c: The breast tissue is heterogeneously
dense, which may obscure small masses.
FINDINGS: There are no findings suspicious for malignancy. Images were
processed with CAD.
IMPRESSION: No mammographic evidence of malignancy. A result letter of this
screening mammogram will be mailed directly to the patient.

RECOMMENDATION:
Screening mammogram in one year. (Code:[id number])

BI-RADS CATEGORY  1: Negative.

## 2020-09-15 ENCOUNTER — Ambulatory Visit: Payer: Medicare Other | Admitting: Family Medicine

## 2020-09-24 ENCOUNTER — Other Ambulatory Visit: Payer: Self-pay | Admitting: Family Medicine

## 2020-09-24 DIAGNOSIS — E119 Type 2 diabetes mellitus without complications: Secondary | ICD-10-CM

## 2020-09-24 DIAGNOSIS — E785 Hyperlipidemia, unspecified: Secondary | ICD-10-CM

## 2020-10-03 ENCOUNTER — Ambulatory Visit: Payer: Self-pay | Admitting: *Deleted

## 2020-10-03 NOTE — Telephone Encounter (Signed)
Summary: diarrhea    Pt stated she has had diarrhea since Friday evening after eating a salad from [employer] and it has continued since/ she stated it is uncontrollable and she has lost 3 lbs so far/ pt is concerned/ please advise      Patient reports uncontrolled diarrhea since Friday- she is staying hydrated but reports food passes straight through her. Denies fever,abdominal cramps and vomiting.Patient has not tried OTC for treatment( imodium) or control. No appointments within disposition- advised UC- will send note for PCP review. Reason for Disposition . [1] MODERATE diarrhea (e.g., 4-6 times / day more than normal) AND [2] present > 48 hours (2 days)  Answer Assessment - Initial Assessment Questions 1. DIARRHEA SEVERITY: "How bad is the diarrhea?" "How many more stools have you had in the past 24 hours than normal?"    - NO DIARRHEA (SCALE 0)   - MILD (SCALE 1-3): Few loose or mushy BMs; increase of 1-3 stools over normal daily number of stools; mild increase in ostomy output.   -  MODERATE (SCALE 4-7): Increase of 4-6 stools daily over normal; moderate increase in ostomy output. * SEVERE (SCALE 8-10; OR 'WORST POSSIBLE'): Increase of 7 or more stools daily over normal; moderate increase in ostomy output; incontinence.     Moderate- immediate response 2. ONSET: "When did the diarrhea begin?"      Friday 3. BM CONSISTENCY: "How loose or watery is the diarrhea?"      watery 4. VOMITING: "Are you also vomiting?" If Yes, ask: "How many times in the past 24 hours?"      no 5. ABDOMINAL PAIN: "Are you having any abdominal pain?" If Yes, ask: "What does it feel like?" (e.g., crampy, dull, intermittent, constant)      no 6. ABDOMINAL PAIN SEVERITY: If present, ask: "How bad is the pain?"  (e.g., Scale 1-10; mild, moderate, or severe)   - MILD (1-3): doesn't interfere with normal activities, abdomen soft and not tender to touch    - MODERATE (4-7): interferes with normal activities or awakens from  sleep, abdomen tender to touch    - SEVERE (8-10): excruciating pain, doubled over, unable to do any normal activities       n/a 7. ORAL INTAKE: If vomiting, "Have you been able to drink liquids?" "How much liquids have you had in the past 24 hours?"     n/a 8. HYDRATION: "Any signs of dehydration?" (e.g., dry mouth [not just dry lips], too weak to stand, dizziness, new weight loss) "When did you last urinate?"     Weight loss 9. EXPOSURE: "Have you traveled to a foreign country recently?" "Have you been exposed to anyone with diarrhea?" "Could you have eaten any food that was spoiled?"     Possible food 10. ANTIBIOTIC USE: "Are you taking antibiotics now or have you taken antibiotics in the past 2 months?"       no 11. OTHER SYMPTOMS: "Do you have any other symptoms?" (e.g., fever, blood in stool)       no 12. PREGNANCY: "Is there any chance you are pregnant?" "When was your last menstrual period?"       n/a  Protocols used: DIARRHEA-A-AH

## 2020-10-09 ENCOUNTER — Other Ambulatory Visit: Payer: Self-pay | Admitting: Family Medicine

## 2020-10-09 DIAGNOSIS — E119 Type 2 diabetes mellitus without complications: Secondary | ICD-10-CM

## 2020-10-09 DIAGNOSIS — E785 Hyperlipidemia, unspecified: Secondary | ICD-10-CM

## 2020-10-26 ENCOUNTER — Encounter: Payer: Self-pay | Admitting: Family Medicine

## 2020-10-26 ENCOUNTER — Other Ambulatory Visit: Payer: Self-pay

## 2020-10-26 ENCOUNTER — Ambulatory Visit (INDEPENDENT_AMBULATORY_CARE_PROVIDER_SITE_OTHER): Payer: Medicare Other | Admitting: Family Medicine

## 2020-10-26 VITALS — BP 126/82 | HR 84 | Temp 97.9°F | Resp 16 | Ht 66.0 in | Wt 201.6 lb

## 2020-10-26 DIAGNOSIS — Z1231 Encounter for screening mammogram for malignant neoplasm of breast: Secondary | ICD-10-CM

## 2020-10-26 DIAGNOSIS — E119 Type 2 diabetes mellitus without complications: Secondary | ICD-10-CM

## 2020-10-26 DIAGNOSIS — I10 Essential (primary) hypertension: Secondary | ICD-10-CM | POA: Diagnosis not present

## 2020-10-26 DIAGNOSIS — E782 Mixed hyperlipidemia: Secondary | ICD-10-CM | POA: Diagnosis not present

## 2020-10-26 DIAGNOSIS — D352 Benign neoplasm of pituitary gland: Secondary | ICD-10-CM

## 2020-10-26 DIAGNOSIS — F331 Major depressive disorder, recurrent, moderate: Secondary | ICD-10-CM

## 2020-10-26 DIAGNOSIS — E220 Acromegaly and pituitary gigantism: Secondary | ICD-10-CM

## 2020-10-26 DIAGNOSIS — Z5181 Encounter for therapeutic drug level monitoring: Secondary | ICD-10-CM

## 2020-10-26 MED ORDER — AMLODIPINE BESYLATE 10 MG PO TABS: ORAL_TABLET | ORAL | 3 refills | Status: DC

## 2020-10-26 MED ORDER — LOSARTAN POTASSIUM 100 MG PO TABS: 100.0000 mg | ORAL_TABLET | Freq: Every day | ORAL | 3 refills | Status: DC

## 2020-10-26 NOTE — Progress Notes (Signed)
Name: [name]   MRN: [id number]    DOB: 09-03-1955   Date:10/26/2020       Progress Note  Chief Complaint  Patient presents with   Diabetes   Hypertension   Hyperlipidemia     Subjective:   [name] is a 65 y.o. female, presents to clinic for routine f/up  Prediabetes - not taking metformin regularly, she has increased urinary frequency esp nocturia, wonders if sugars are high Pt today admits she was not taking metformin at the time of last  Lab Results  Component Value Date   HGBA1C 6.4 (H) 03/16/2020  A1C increased a little with last OV, was down to 6.1    HLD - previously on statin, but no longer taking Was on lipitor 20 mg Lab Results  Component Value Date   CHOL 145 03/16/2020   HDL 46 (L) 03/16/2020   LDLCALC 82 03/16/2020   TRIG 87 03/16/2020   CHOLHDL 3.2 03/16/2020    HTN - on norvasc 10 mg and losartan 100 mg daily BP Readings from Last 3 Encounters:  10/26/20 126/82  05/03/20 128/90  03/16/20 128/84   good med compliance, BP at goal today, no Sx or SE, she has seen cardiology in the past, most of her physical sx he attributed to stress   Anxiety from situational stress- she has not talked to a therapist yet, but has stayed in contact with [facility], although she has cancelled a lot of virtual appts, just talking to [name] has really helped the pt Pt doesn't want meds, has not been able to change her multiple stressors GAD 7 : Generalized Anxiety Score 05/27/2018 11/10/2017  Nervous, Anxious, on Edge 0 1  Control/stop worrying 1 1  Worry too much - different things 1 1  Trouble relaxing 1 2  Restless 0 0  Easily annoyed or irritable 0 0  Afraid - awful might happen 0 1  Total GAD 7 Score 3 6  Anxiety Difficulty Not difficult at all -    Depression screen [facility] 2/9 10/26/2020 03/16/2020 10/25/2019  Decreased Interest 0 0 -  Down, Depressed, Hopeless 0 0 -  PHQ - 2 Score 0 0 -  Altered sleeping 0 - 3  Tired, decreased energy  0 - 1  Change in appetite 0 - 1  Feeling bad or failure about yourself  0 - 1  Trouble concentrating 0 - 1  Moving slowly or fidgety/restless 0 - 0  Suicidal thoughts 0 - 0  PHQ-9 Score 0 - -  Difficult doing work/chores Not difficult at all - -  Some recent data might be hidden   PHQ and GAD 7 reviewed     Current Outpatient Medications:    cholecalciferol (VITAMIN D3) 25 MCG (1000 UT) tablet, Take 1,000 Units by mouth daily., Disp: , Rfl:    fluticasone (FLONASE) 50 MCG/ACT nasal spray, PLACE 2 SPRAYS INTO BOTH NOSTRILS AS NEEDED., Disp: 16 g, Rfl: 3   polyethylene glycol powder (GLYCOLAX/MIRALAX) powder, as needed. , Disp: , Rfl: 0   amLODipine (NORVASC) 10 MG tablet, TAKE 1 TABLET BY MOUTH EVERY DAY FOR BLOOD PRESSURE, Disp: 90 tablet, Rfl: 3   losartan (COZAAR) 100 MG tablet, Take 1 tablet (100 mg total) by mouth daily., Disp: 90 tablet, Rfl: 3  Patient Active Problem List   Diagnosis Date Noted   Hiatal hernia 11/10/2017   Hypertension 06/13/2017   Dysfunction of left eustachian tube 10/01/2015   Vitamin D deficiency  10/01/2015   Prediabetes 09/27/2015   Perforated nasal septum 08/25/2015   GERD (gastroesophageal reflux disease)    Osteoarthritis    Hyperlipidemia    Sleep apnea    DDD (degenerative disc disease), lumbar    Insomnia    Recurrent major depression ([place])    Migraine    Pituitary adenoma ([place]) 11/12/2013   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[place]) 10/15/2013    Past Surgical History:  Procedure Laterality Date   ABDOMINAL HYSTERECTOMY     partial with bladder suspension, no cervix   CHOLECYSTECTOMY  2003   COLONOSCOPY  2008   1 polyp- return 10 years   COLONOSCOPY WITH PROPOFOL N/A 06/30/2017   Procedure: COLONOSCOPY WITH PROPOFOL;  Surgeon: Lucilla Lame, MD;  Location: [place];  Service: Endoscopy;  Laterality: N/A;  slep apnea   KNEE SURGERY Right 2014   PITUITARY SURGERY     x2 30+ years ago   transnasal approach pituitary adenoma resection  2015    clival/parasellar mass    Family History  Problem Relation Age of Onset   Diabetes Mother    Hypertension Mother    Cancer Mother        possible liver   Heart disease Father    Hypertension Father    Heart attack Father    Alzheimer's disease Father    Diabetes Sister    Hypertension Sister    Cancer Sister        lung?   Stroke Sister    Atrial fibrillation Sister    Breast cancer Maternal Grandmother    Cancer Sister    Lymphoma Sister    Stroke Sister     Social History   Tobacco Use   Smoking status: Never    Passive exposure: Yes   Smokeless tobacco: Never  Vaping Use   Vaping Use: Never used  Substance Use Topics   Alcohol use: No    Comment: rare (1-2 drinks/yr)   Drug use: No     Allergies  Allergen Reactions   Lisinopril Cough        Shrimp [Shellfish Allergy] Swelling    throat swells (lobster also)   Sulfa Antibiotics Swelling    Health Maintenance  Topic Date Due   DEXA SCAN  Never done   MAMMOGRAM  09/28/2020   Zoster Vaccines- Shingrix (1 of 2) 01/26/2021 (Originally 12/25/1974)   PNA vac Low Risk Adult (1 of 2 - PCV13) 03/16/2021 (Originally 12/26/2019)   INFLUENZA VACCINE  12/11/2020   COVID-19 Vaccine (5 - Booster for Moderna series) 12/18/2020   TETANUS/TDAP  03/27/2025   COLONOSCOPY (Pts 45-26yrs Insurance coverage will need to be confirmed)  07/01/2027   Hepatitis C Screening  Completed   HIV Screening  Completed   HPV VACCINES  Aged Out   PAP SMEAR-Modifier  Discontinued    Chart Review Today: I personally reviewed active problem list, medication list, allergies, family history, social history, health maintenance, notes from last encounter, lab results, imaging with the patient/caregiver today.   Review of Systems  Constitutional: Negative.   HENT: Negative.    Eyes: Negative.   Respiratory: Negative.    Cardiovascular: Negative.   Gastrointestinal: Negative.   Endocrine: Negative.   Genitourinary: Negative.    Musculoskeletal: Negative.   Skin: Negative.   Allergic/Immunologic: Negative.   Neurological: Negative.   Hematological: Negative.   Psychiatric/Behavioral: Negative.    All other systems reviewed and are negative.   Objective:   Vitals:   10/26/20 1533  BP: 126/82  Pulse: 84  Resp: 16  Temp: 97.9 F (36.6 C)  SpO2: 99%  Weight: 201 lb 9.6 oz (91.4 kg)  Height: 5\' 6"  (1.676 m)    Body mass index is 32.54 kg/m.  Physical Exam Vitals and nursing note reviewed.  Constitutional:      General: She is not in acute distress.    Appearance: Normal appearance. She is well-developed. She is obese. She is not ill-appearing, toxic-appearing or diaphoretic.     Interventions: Face mask in place.  HENT:     Head: Normocephalic and atraumatic.     Right Ear: External ear normal.     Left Ear: External ear normal.  Eyes:     General: Lids are normal. No scleral icterus.       Right eye: No discharge.        Left eye: No discharge.     Conjunctiva/sclera: Conjunctivae normal.  Neck:     Trachea: Phonation normal. No tracheal deviation.  Cardiovascular:     Rate and Rhythm: Normal rate and regular rhythm.     Pulses: Normal pulses.          Radial pulses are 2+ on the right side and 2+ on the left side.       Posterior tibial pulses are 2+ on the right side and 2+ on the left side.     Heart sounds: Normal heart sounds. No murmur heard.   No friction rub. No gallop.  Pulmonary:     Effort: Pulmonary effort is normal. No respiratory distress.     Breath sounds: Normal breath sounds. No stridor. No wheezing, rhonchi or rales.  Chest:     Chest wall: No tenderness.  Abdominal:     General: Bowel sounds are normal. There is no distension.     Palpations: Abdomen is soft.  Musculoskeletal:     Right lower leg: No edema.     Left lower leg: No edema.  Skin:    General: Skin is warm and dry.     Coloration: Skin is not jaundiced or pale.     Findings: No rash.  Neurological:      Mental Status: She is alert.     Motor: No abnormal muscle tone.     Gait: Gait normal.  Psychiatric:        Mood and Affect: Mood normal.        Speech: Speech normal.        Behavior: Behavior normal.        Assessment & Plan:     ICD-10-CM   1. Mixed hyperlipidemia  F02.6 COMPLETE METABOLIC PANEL WITH GFR    Lipid panel   pt stopped statin, thinks it was causing some muscle spasms - if lipids elevated or ascvd score very high will need to try to find med she tolerates    2. Primary hypertension  V78 COMPLETE METABOLIC PANEL WITH GFR   see below    3. Type 2 diabetes mellitus without complication, without long-term current use of insulin (HCC)  E11.9 Hemoglobin A1C    COMPLETE METABOLIC PANEL WITH GFR    Lipid panel   A1C in prediabetic range off meds, recheck     4. Encounter for screening mammogram for malignant neoplasm of breast  Z12.31 MM 3D SCREEN BREAST BILATERAL    5. Essential hypertension  I10 losartan (COZAAR) 100 MG tablet    amLODipine (NORVASC) 10 MG tablet   Blood pressure at goal today, stable and well-controlled,  continue losartan and Norvasc, no longer using lasix    6. 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 Loney [facility]oh with [facility]    8. Pituitary adenoma ([place]) Chronic D35.2    same as above -     9. Encounter for medication monitoring  Z51.81 Hemoglobin A1C    COMPLETE METABOLIC PANEL WITH GFR    Lipid panel     Consider different statin or every other day dosing/lower dosing She is convinced statin caused muscle spasms and chest pain  We will do labs next week.  Also due for dexa - which was previously ordered - she will call [facility] to schedule dexa and mammo   6 month routine f/up after [facility]  Delsa Grana, PA-C 10/26/20  3:46 PM

## 2020-11-01 ENCOUNTER — Other Ambulatory Visit: Payer: Medicare Other

## 2020-11-02 LAB — COMPLETE METABOLIC PANEL WITH GFR
AG Ratio: 1.3 (calc) (ref 1.0–2.5)
ALT: 16 U/L (ref 6–29)
AST: 17 U/L (ref 10–35)
Albumin: 3.9 g/dL (ref 3.6–5.1)
Alkaline phosphatase (APISO): 117 U/L (ref 37–153)
BUN: 9 mg/dL (ref 7–25)
CO2: 27 mmol/L (ref 20–32)
Calcium: 9.3 mg/dL (ref 8.6–10.4)
Chloride: 106 mmol/L (ref 98–110)
Creat: 0.71 mg/dL (ref 0.50–0.99)
GFR, Est African American: 104 mL/min/{1.73_m2} (ref >=60)
GFR, Est Non African American: 89 mL/min/{1.73_m2} (ref >=60)
Globulin: 3.1 g/dL (calc) (ref 1.9–3.7)
Glucose, Bld: 101 mg/dL — ABNORMAL HIGH (ref 65–99)
Potassium: 3.8 mmol/L (ref 3.5–5.3)
Sodium: 142 mmol/L (ref 135–146)
Total Bilirubin: 0.3 mg/dL (ref 0.2–1.2)
Total Protein: 7 g/dL (ref 6.1–8.1)

## 2020-11-02 LAB — LIPID PANEL
Cholesterol: 160 mg/dL (ref <200)
HDL: 53 mg/dL (ref >=50)
LDL Cholesterol (Calc): 91 mg/dL (calc)
Non-HDL Cholesterol (Calc): 107 mg/dL (calc) (ref <130)
Total CHOL/HDL Ratio: 3 (calc) (ref <5.0)
Triglycerides: 70 mg/dL (ref <150)

## 2020-11-02 LAB — HEMOGLOBIN A1C
Hgb A1c MFr Bld: 6.2 % of total Hgb — ABNORMAL HIGH (ref <5.7)
Mean Plasma Glucose: 131 mg/dL
eAG (mmol/L): 7.3 mmol/L

## 2020-11-03 ENCOUNTER — Ambulatory Visit: Payer: Medicare Other | Admitting: Family Medicine

## 2020-11-08 ENCOUNTER — Ambulatory Visit (INDEPENDENT_AMBULATORY_CARE_PROVIDER_SITE_OTHER): Payer: Medicare Other | Admitting: Family Medicine

## 2020-11-08 ENCOUNTER — Other Ambulatory Visit: Payer: Self-pay

## 2020-11-08 ENCOUNTER — Encounter: Payer: Self-pay | Admitting: Family Medicine

## 2020-11-08 VITALS — BP 136/84 | HR 66 | Temp 98.0°F | Resp 16 | Ht 66.0 in | Wt 201.6 lb

## 2020-11-08 DIAGNOSIS — Z0000 Encounter for general adult medical examination without abnormal findings: Secondary | ICD-10-CM

## 2020-11-08 NOTE — Patient Instructions (Signed)
It was great to see you!  Our plans for today:  - Make an appointment for a dental and eye exam.  - Make appointments for bone density and mammogram.  Take care and seek immediate care sooner if you develop any concerns.   Dr. Ky Barban  Things to do to keep yourself healthy  - Exercise at least 30-45 minutes a day, 3-4 days a week.  - Eat a low-fat diet with lots of fruits and vegetables, up to 7-9 servings per day.  - Seatbelts can save your life. Wear them always.  - Smoke detectors on every level of your home, check batteries every year.  - Eye Doctor - have an eye exam every 1-2 years  - Safe sex - if you may be exposed to STDs, use a condom.  - Alcohol -  If you drink, do it moderately, less than 2 drinks per day.  - [place]. Choose someone to speak for you if you are not able. https://www.prepareforyourcare.org is a great website to help you navigate this. - Depression is common in our stressful world.If you're feeling down or losing interest in things you normally enjoy, please come in for a visit.  - Violence - If anyone is threatening or hurting you, please call immediately.

## 2020-11-08 NOTE — Progress Notes (Signed)
Patient: [name], Female    DOB: 1955-10-31, 65 y.o.   MRN: [id number]  Visit Date: 11/08/2020  Today's Provider: Myles Gip, DO   Chief Complaint  Patient presents with   welcome to medicare    Subjective:    HPI [name] is a 65 y.o. female who presents today for her Annual Wellness Visit.  Patient/Caregiver input:    Review of Systems Constitutional: Negative for fever or weight change.  Respiratory: Negative for cough and shortness of breath.   Cardiovascular: Negative for chest pain or palpitations.  Gastrointestinal: Negative for abdominal pain, no bowel changes.  Musculoskeletal: Negative for gait problem or joint swelling.  Skin: Negative for rash.  Neurological: Negative for dizziness or headache.  No other specific complaints in a complete review of systems (except as listed in HPI above).  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    Perforation of nasal septum    Pre-diabetes    Recurrent major depression ([place])    Sleep apnea    Does not use CPAP   Spondylosis    Thrombocytosis 10/01/2015   mild   Wears dentures    full upper    Past Surgical History:  Procedure Laterality Date   ABDOMINAL HYSTERECTOMY     partial with bladder suspension, no cervix   CHOLECYSTECTOMY  2003   COLONOSCOPY  2008   1 polyp- return 10 years   COLONOSCOPY WITH PROPOFOL N/A 06/30/2017   Procedure: COLONOSCOPY WITH PROPOFOL;  Surgeon: Lucilla Lame, MD;  Location: [facility];  Service: Endoscopy;  Laterality: N/A;  slep  apnea   KNEE SURGERY Right 2014   PITUITARY SURGERY     x2 30+ years ago   transnasal approach pituitary adenoma resection  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   Stroke Sister    Atrial fibrillation Sister    Breast cancer Maternal Grandmother    Cancer Sister    Lymphoma Sister    Stroke Sister     Social History   Socioeconomic History   Marital status: Divorced    Spouse name: Not on file   Number of children: 2   Years of education: Not on file   Highest education level: High school graduate  Occupational History   Occupation: retired  Tobacco Use   Smoking status: Never    Passive exposure: Yes   Smokeless tobacco: Never  Vaping Use   Vaping Use: Never used  Substance and Sexual Activity   Alcohol use: No  Comment: rare (1-2 drinks/yr)   Drug use: No   Sexual activity: Not Currently    Birth control/protection: Surgical  Other Topics Concern   Not on file  Social History Narrative   Not on file   Social Determinants of Health   Financial Resource Strain: Not on file  Food Insecurity: No Food Insecurity   Worried About Running Out of Food in the Last Year: Never true   Ran Out of Food in the Last Year: Never true  Transportation Needs: No Transportation Needs   Lack of Transportation (Medical): No   Lack of Transportation (Non-Medical): No  Physical Activity: Sufficiently Active   Days of Exercise per Week: 5 days   Minutes of Exercise per Session: 60 min  Stress: No Stress Concern Present   Feeling of Stress : Not at all  Social Connections: Moderately Isolated   Frequency of Communication with Friends and Family: More than three times a week    Frequency of Social Gatherings with Friends and Family: More than three times a week   Attends Religious Services: 1 to 4 times per year   Active Member of [employer] or Organizations: No   Attends Archivist Meetings: Never   Marital Status: Divorced  Human resources officer Violence: Not At Risk   Fear of Current or Ex-Partner: No   Emotionally Abused: No   Physically Abused: No   Sexually Abused: No    Outpatient Encounter Medications as of 11/08/2020  Medication Sig   amLODipine (NORVASC) 10 MG tablet TAKE 1 TABLET BY MOUTH EVERY DAY FOR BLOOD PRESSURE   cholecalciferol (VITAMIN D3) 25 MCG (1000 UT) tablet Take 1,000 Units by mouth daily.   fluticasone (FLONASE) 50 MCG/ACT nasal spray PLACE 2 SPRAYS INTO BOTH NOSTRILS AS NEEDED.   losartan (COZAAR) 100 MG tablet Take 1 tablet (100 mg total) by mouth daily.   polyethylene glycol powder (GLYCOLAX/MIRALAX) powder as needed.    No facility-administered encounter medications on file as of 11/08/2020.    Allergies  Allergen Reactions   Lisinopril Cough        Shrimp [Shellfish Allergy] Swelling    throat swells (lobster also)   Sulfa Antibiotics Swelling    Care Team Updated in EHR: Yes  Last Vision Exam: 2 years ago Wears corrective lenses: Yes Last Dental Exam: years ago. Last Hearing Exam: ~4 years ago Wears Hearing Aids: No  Functional Ability / Safety Screening 1.  Was the timed Get Up and Go test shorter than 30 seconds?   yes 2.  Does the patient need help with the phone, transportation, shopping,      preparing meals, housework, laundry, medications, or managing money?  no 3.  Is the patient's home free of loose throw rugs in walkways, pet beds, electrical cords, etc?   yes      Grab bars in the bathroom? no      Handrails on the stairs?   no      Adequate lighting?   yes 4.  Has the patient noticed any hearing difficulties?   no  Diet Recall and Exercise Regimen:  - eats 2-3 meals per day. Plenty of fruits and  vegetables. - no formal exercise recently due to caring for disabled sisters  Advanced Care Planning: A voluntary discussion about advance care planning including the explanation and discussion of advance directives.  Discussed health care proxy and Living will.  Patient does not have a living will at present time. If patient does have living will,  I have requested they bring this to the clinic to be scanned in to their chart. Does patient have a HCPOA?    no If yes, name and contact information:  Does patient have a living will or MOST form?  no  Cancer Screenings: Lung: Low Dose CT Chest recommended if Age 60-80 years, 30 pack-year currently smoking OR have quit w/in 15years. Patient does not qualify. Breast: Up to date on Mammogram? No, ordered  Up to date of Bone Density/Dexa? No, ordered Colon: UTD  Additional Screenings:  Hepatitis B/HIV/Syphillis: n/a Hepatitis C Screening: UTD, negative  Intimate Partner Violence: n/a  Objective:   Vitals: BP 136/84   Pulse 66   Temp 98 F (36.7 C)   Resp 16   Ht 5\' 6"  (1.676 m)   Wt 201 lb 9.6 oz (91.4 kg)   SpO2 99%   BMI 32.54 kg/m  Body mass index is 32.54 kg/m.  No results found.  Physical Exam Constitutional: Patient appears well-developed and well-nourished. Overweight No distress.  HEENT: head atraumatic, normocephalic, ears normal Cardiovascular: Normal rate, No BLE edema. Pulmonary/Chest: Effort normal. No respiratory distress. Psychiatric: Patient has a normal mood and affect. behavior is normal. Judgment and thought content normal.  Cognitive Testing - 6-CIT  Correct? Score   What year is it? yes 0 Yes = 0    No = 4  What month is it? yes 0 Yes = 0    No = 3  Remember:     Pia Mau, [place], [place]     What time is it? yes 0 Yes = 0    No = 3  Count backwards from 20 to 1 yes 0 Correct = 0    1 error = 2   More than 1 error = 4  Say the months of the year in reverse. yes 0 Correct = 0    1 error = 2    More than 1 error = 4  What address did I ask you to remember? yes 2 Correct = 0  1 error = 2    2 error = 4    3 error = 6    4 error = 8    All wrong = 10       TOTAL SCORE  2/28   Interpretation:  Normal  Normal (0-7) Abnormal (8-28)   Fall Risk: Fall Risk  11/08/2020 11/08/2020 10/26/2020 03/16/2020 09/13/2019  Falls in the past year? 0 0 0 0 0  Number falls in past yr: 0 0 0 0 0  Injury with Fall? - 0 0 0 0  Follow up Falls evaluation completed - - Falls evaluation completed -    Depression Screen Depression screen [facility] 2/9 11/08/2020 10/26/2020 03/16/2020 10/25/2019 09/13/2019  Decreased Interest 0 0 0 - 3  Down, Depressed, Hopeless 1 0 0 - 3  PHQ - 2 Score 1 0 0 - 6  Altered sleeping 0 0 - 3 3  Tired, decreased energy 0 0 - 1 1  Change in appetite 0 0 - 1 1  Feeling bad or failure about yourself  0 0 - 1 1  Trouble concentrating 0 0 - 1 1  Moving slowly or fidgety/restless 0 0 - 0 0  Suicidal thoughts 0 0 - 0 0  PHQ-9 Score 1 0 - - 13  Difficult doing work/chores Not difficult at all Not difficult at all - - Somewhat difficult  Some recent data might be hidden  Recent Results (from the past 2160 hour(s))  Hemoglobin A1C     Status: Abnormal   Collection Time: 11/01/20  9:28 AM  Result Value Ref Range   Hgb A1c MFr Bld 6.2 (H) <5.7 % of total Hgb    Comment: For someone without known diabetes, a hemoglobin  A1c value between 5.7% and 6.4% is consistent with prediabetes and should be confirmed with a  follow-up test. . For someone with known diabetes, a value <7% indicates that their diabetes is well controlled. A1c targets should be individualized based on duration of diabetes, age, comorbid conditions, and other considerations. . This assay result is consistent with an increased risk of diabetes. . Currently, no consensus exists regarding use of hemoglobin A1c for diagnosis of diabetes for children. .    Mean Plasma Glucose 131 mg/dL   eAG (mmol/L) 7.3 mmol/L   COMPLETE METABOLIC PANEL WITH GFR     Status: Abnormal   Collection Time: 11/01/20  9:28 AM  Result Value Ref Range   Glucose, Bld 101 (H) 65 - 99 mg/dL    Comment: .            Fasting reference interval . For someone without known diabetes, a glucose value between 100 and 125 mg/dL is consistent with prediabetes and should be confirmed with a follow-up test. .    BUN 9 7 - 25 mg/dL   Creat 0.71 0.50 - 0.99 mg/dL    Comment: For patients >27 years of age, the reference limit for Creatinine is approximately 13% higher for people identified as African-American. .    GFR, Est Non African American 89 > OR = 60 mL/min/1.38m2   GFR, Est African American 104 > OR = 60 mL/min/1.79m2   BUN/Creatinine Ratio NOT APPLICABLE 6 - 22 (calc)   Sodium 142 135 - 146 mmol/L   Potassium 3.8 3.5 - 5.3 mmol/L   Chloride 106 98 - 110 mmol/L   CO2 27 20 - 32 mmol/L   Calcium 9.3 8.6 - 10.4 mg/dL   Total Protein 7.0 6.1 - 8.1 g/dL   Albumin 3.9 3.6 - 5.1 g/dL   Globulin 3.1 1.9 - 3.7 g/dL (calc)   AG Ratio 1.3 1.0 - 2.5 (calc)   Total Bilirubin 0.3 0.2 - 1.2 mg/dL   Alkaline phosphatase (APISO) 117 37 - 153 U/L   AST 17 10 - 35 U/L   ALT 16 6 - 29 U/L  Lipid panel     Status: None   Collection Time: 11/01/20  9:28 AM  Result Value Ref Range   Cholesterol 160 <200 mg/dL   HDL 53 > OR = 50 mg/dL   Triglycerides 70 <150 mg/dL   LDL Cholesterol (Calc) 91 mg/dL (calc)    Comment: Reference range: <100 . Desirable range <100 mg/dL for primary prevention;   <70 mg/dL for patients with CHD or diabetic patients  with > or = 2 CHD risk factors. Marland Kitchen LDL-C is now calculated using the Martin-Hopkins  calculation, which is a validated novel method providing  better accuracy than the Friedewald equation in the  estimation of LDL-C.  Cresenciano Genre et al. Annamaria Helling. 3335;456(25): 2061-2068  (http://education.[facility].com/faq/FAQ164)    Total CHOL/HDL Ratio 3.0 <5.0 (calc)   Non-HDL Cholesterol (Calc)  107 <130 mg/dL (calc)    Comment: For patients with diabetes plus 1 major ASCVD risk  factor, treating to a non-HDL-C goal of <100 mg/dL  (LDL-C of <70 mg/dL) is considered a therapeutic  option.  Assessment & Plan:    1. Medicare annual wellness visit, initial   Exercise Activities and Dietary recommendations  Goals       "I want to learn how not to be so nice" (pt-stated)      [place] (see longitudinal plan of care for additional care plan information)  Current Barriers:  Mental Health Concerns   Clinical Social Work Clinical Goal(s):  Over the next 90 days, patient will follow up with a mental health therapist for ongoing treatment* as directed by SW  Interventions: Inter-disciplinary care team collaboration (see longitudinal plan of care) Patient interviewed and appropriate assessments performed Provided supportive counseling with regard to patient's depression and possible triggers recommending ongoing therapy as follow up Discussed plans with patient for ongoing care management follow up and provided patient with direct contact information for care management team Referred [facility] to [facility] (mental health provider) for long term follow up and therapy/counseling   Patient Self Care Activities:  Performs ADL's independently Performs IADL's independently Knowledge Deficit related to local mental health providers  Initial goal documentation        Exercise daily      Walk 10-15 minutes daily.        - Discussed health benefits of physical activity, and encouraged her to engage in regular exercise appropriate for her age and condition.   Immunization History  Administered Date(s) Administered   Hep A / Hep B 04/14/2018, 05/22/2018   Moderna SARS-COV2 Booster Vaccination 03/09/2020, 08/18/2020   Moderna Sars-Covid-2 Vaccination 07/21/2019, 08/18/2019, 03/09/2020   Tdap 03/28/2015    Health Maintenance  Topic Date Due   DEXA  SCAN  Never done   MAMMOGRAM  09/28/2020   Zoster Vaccines- Shingrix (1 of 2) 01/26/2021 (Originally 12/25/1974)   PNA vac Low Risk Adult (1 of 2 - PCV13) 03/16/2021 (Originally 12/26/2019)   INFLUENZA VACCINE  12/11/2020   COVID-19 Vaccine (5 - Booster for Moderna series) 12/18/2020   TETANUS/TDAP  03/27/2025   COLONOSCOPY (Pts 45-17yrs Insurance coverage will need to be confirmed)  07/01/2027   Hepatitis C Screening  Completed   HIV Screening  Completed   HPV VACCINES  Aged Out   PAP SMEAR-Modifier  Discontinued    No orders of the defined types were placed in this encounter.   Current Outpatient Medications:    amLODipine (NORVASC) 10 MG tablet, TAKE 1 TABLET BY MOUTH EVERY DAY FOR BLOOD PRESSURE, Disp: 90 tablet, Rfl: 3   cholecalciferol (VITAMIN D3) 25 MCG (1000 UT) tablet, Take 1,000 Units by mouth daily., Disp: , Rfl:    fluticasone (FLONASE) 50 MCG/ACT nasal spray, PLACE 2 SPRAYS INTO BOTH NOSTRILS AS NEEDED., Disp: 16 g, Rfl: 3   losartan (COZAAR) 100 MG tablet, Take 1 tablet (100 mg total) by mouth daily., Disp: 90 tablet, Rfl: 3   polyethylene glycol powder (GLYCOLAX/MIRALAX) powder, as needed. , Disp: , Rfl: 0 There are no discontinued medications.  I have personally reviewed and addressed the Medicare Annual Wellness health risk assessment questionnaire and have noted the following in the patient's chart:  A.         Medical and social history & family history B.         Use of alcohol, tobacco, and illicit drugs  C.         Current medications and supplements D.         Functional and Cognitive ability and status E.  Nutritional status F.         Physical activity G.        Advance directives H.         List of other physicians I.          Hospitalizations, surgeries, and ER visits in previous 12 months J.         [place] such as hearing, vision, cognitive function, and depression L.         Referrals and appointments: none  In addition,  I have reviewed and discussed with patient certain preventive protocols, quality metrics, and best practice recommendations. A written personalized care plan for preventive services as well as general preventive health recommendations were provided to patient.   See attached scanned questionnaire for additional information.   Return in about 6 months (around 05/10/2021).

## 2021-03-07 ENCOUNTER — Ambulatory Visit
Admission: RE | Admit: 2021-03-07 | Discharge: 2021-03-07 | Disposition: A | Discharge status: Home / Self Care | Payer: Medicare Other | Source: Ambulatory Visit | Attending: Family Medicine | Admitting: Family Medicine

## 2021-03-07 ENCOUNTER — Other Ambulatory Visit: Payer: Self-pay

## 2021-03-07 DIAGNOSIS — Z780 Asymptomatic menopausal state: Secondary | ICD-10-CM | POA: Insufficient documentation

## 2021-03-07 DIAGNOSIS — Z1231 Encounter for screening mammogram for malignant neoplasm of breast: Secondary | ICD-10-CM | POA: Diagnosis present

## 2021-04-09 ENCOUNTER — Ambulatory Visit (INDEPENDENT_AMBULATORY_CARE_PROVIDER_SITE_OTHER): Payer: Medicare Other | Admitting: Family Medicine

## 2021-04-09 ENCOUNTER — Encounter: Payer: Self-pay | Admitting: Family Medicine

## 2021-04-09 ENCOUNTER — Other Ambulatory Visit: Payer: Self-pay

## 2021-04-09 VITALS — BP 114/64 | HR 86 | Temp 97.9°F | Resp 16 | Ht 66.0 in | Wt 204.2 lb

## 2021-04-09 DIAGNOSIS — F331 Major depressive disorder, recurrent, moderate: Secondary | ICD-10-CM

## 2021-04-09 DIAGNOSIS — R7303 Prediabetes: Secondary | ICD-10-CM

## 2021-04-09 DIAGNOSIS — I10 Essential (primary) hypertension: Secondary | ICD-10-CM

## 2021-04-09 DIAGNOSIS — Z0000 Encounter for general adult medical examination without abnormal findings: Secondary | ICD-10-CM | POA: Diagnosis not present

## 2021-04-09 DIAGNOSIS — K219 Gastro-esophageal reflux disease without esophagitis: Secondary | ICD-10-CM

## 2021-04-09 DIAGNOSIS — E220 Acromegaly and pituitary gigantism: Secondary | ICD-10-CM

## 2021-04-09 DIAGNOSIS — E782 Mixed hyperlipidemia: Secondary | ICD-10-CM | POA: Diagnosis not present

## 2021-04-09 NOTE — Progress Notes (Signed)
Patient: [name], Female    DOB: 03-15-1956, 65 y.o.   MRN: [id number] Danelle Berry, PA-C Visit Date: 04/09/2021  Today's Provider: Danelle Berry, PA-C   Chief Complaint  Patient presents with   Annual Exam   Subjective:   Annual physical exam:  [name] is a 65 y.o. female who presents today for complete physical exam:  Exercise/Activity:  walking often 3+ x a week for over an hour Diet/nutrition:   salads, avoids some things that irritate her Sleep:  still some trouble with sleep   SDOH Screenings   Alcohol Screen: Not on file  Depression (PHQ2-9): Low Risk    PHQ-2 Score: 0  Financial Resource Strain: Unknown   Difficulty of Paying Living Expenses: Patient refused  Food Insecurity: No Food Insecurity   Worried About Programme researcher, broadcasting/film/video in the Last Year: Never true   Ran Out of Food in the Last Year: Never true  Housing: Not on file  Physical Activity: Sufficiently Active   Days of Exercise per Week: 3 days   Minutes of Exercise per Session: 60 min  Social Connections: Moderately Integrated   Frequency of Communication with Friends and Family: More than three times a week   Frequency of Social Gatherings with Friends and Family: More than three times a week   Attends Religious Services: 1 to 4 times per year   Active Member of Clubs or Organizations: No   Attends Engineer, structural: 1 to 4 times per year   Marital Status: Divorced  Stress: Unknown   Feeling of Stress : Patient refused  Tobacco Use: Medium Risk   Smoking Tobacco Use: Never   Smokeless Tobacco Use: Never   Passive Exposure: Yes  Transportation Needs: No Transportation Needs   Lack of Transportation (Medical): No   Lack of Transportation (Non-Medical): No      USPSTF grade A and B recommendations - reviewed and addressed today  Depression:  Phq 9 completed today by patient, was reviewed by me with patient in the room PHQ score is good, pt feels good PHQ 2/9 Scores  04/09/2021 11/08/2020 10/26/2020 03/16/2020  PHQ - 2 Score 0 1 0 0  PHQ- 9 Score 0 1 0 -   Depression screen [facility] 2/9 04/09/2021 11/08/2020 10/26/2020 03/16/2020 10/25/2019  Decreased Interest 0 0 0 0 -  Down, Depressed, Hopeless 0 1 0 0 -  PHQ - 2 Score 0 1 0 0 -  Altered sleeping 0 0 0 - 3  Tired, decreased energy 0 0 0 - 1  Change in appetite 0 0 0 - 1  Feeling bad or failure about yourself  0 0 0 - 1  Trouble concentrating 0 0 0 - 1  Moving slowly or fidgety/restless 0 0 0 - 0  Suicidal thoughts 0 0 0 - 0  PHQ-9 Score 0 1 0 - -  Difficult doing work/chores Not difficult at all Not difficult at all Not difficult at all - -  Some recent data might be hidden    Alcohol screening: Flowsheet Row Office Visit from 03/16/2020 in [facility]  AUDIT-C Score 0       Immunizations and Health Maintenance: Health Maintenance  Topic Date Due   Pneumonia Vaccine 56+ Years old (1 - PCV) Never done   Zoster Vaccines- Shingrix (1 of 2) Never done   COVID-19 Vaccine (4 - Booster for Moderna series) 10/13/2020   MAMMOGRAM  03/07/2022   TETANUS/TDAP  03/27/2025  COLONOSCOPY (Pts 45-68yrs Insurance coverage will need to be confirmed)  07/01/2027   DEXA SCAN  Completed   Hepatitis C Screening  Completed   HPV VACCINES  Aged Out   INFLUENZA VACCINE  Discontinued     Hep C Screening:  done  STD testing and prevention (HIV/chl/gon/syphilis):  see above, no additional testing desired by pt today  - none  Intimate partner violence: safe   Sexual History/Pain during Intercourse: Divorced, not currently sexually active no complaints  Menstrual History/LMP/Abnormal Bleeding: none, denies No LMP recorded. Patient has had a hysterectomy.  Incontinence Symptoms: none -some mild increased urinary frequency  Breast cancer: done reviewed just done Last Mammogram: *see HM list above BRCA gene screening:   Cervical cancer screening: done w/ hysterectomy  Pt denies family hx of  cancers - breast, ovarian, uterine, colon:     Osteoporosis:   Discussion on osteoporosis per age, including high calcium and vitamin D supplementation, weight bearing exercises Pt is supplementing with daily calcium/Vit D. Done 02/2021 reviewed - Bone scan/dexa   Skin cancer:  Hx of skin CA -  NO Discussed atypical lesions   Colorectal cancer:   Colonoscopy is UTD Discussed concerning signs and sx of CRC, pt denies melena, hematochezia, change in bowels  Lung cancer:   Low Dose CT Chest recommended if Age 25-80 years, 20 pack-year currently smoking OR have quit w/in 15years. Patient does not qualify.    Social History   Tobacco Use   Smoking status: Never    Passive exposure: Yes   Smokeless tobacco: Never  Vaping Use   Vaping Use: Never used  Substance Use Topics   Alcohol use: No    Comment: rare (1-2 drinks/yr)   Drug use: No     Flowsheet Row Office Visit from 03/16/2020 in [facility]  AUDIT-C Score 0       Family History  Problem Relation Age of Onset   Diabetes Mother    Hypertension Mother    Cancer Mother        possible liver   Heart disease Father    Hypertension Father    Heart attack Father    Alzheimer's disease Father    Diabetes Sister    Hypertension Sister    Cancer Sister        lung?   Stroke Sister    Atrial fibrillation Sister    Breast cancer Maternal Grandmother    Cancer Sister    Lymphoma Sister    Stroke Sister      Blood pressure/Hypertension: BP Readings from Last 3 Encounters:  04/09/21 114/64  11/08/20 136/84  10/26/20 126/82    Weight/Obesity: Wt Readings from Last 3 Encounters:  04/09/21 204 lb 3.2 oz (92.6 kg)  11/08/20 201 lb 9.6 oz (91.4 kg)  10/26/20 201 lb 9.6 oz (91.4 kg)   BMI Readings from Last 3 Encounters:  04/09/21 32.96 kg/m  11/08/20 32.54 kg/m  10/26/20 32.54 kg/m     Lipids:  Lab Results  Component Value Date   CHOL 160 11/01/2020   CHOL 145 03/16/2020   CHOL 155  09/13/2019   Lab Results  Component Value Date   HDL 53 11/01/2020   HDL 46 (L) 03/16/2020   HDL 49 (L) 09/13/2019   Lab Results  Component Value Date   LDLCALC 91 11/01/2020   LDLCALC 82 03/16/2020   LDLCALC 91 09/13/2019   Lab Results  Component Value Date   TRIG 70 11/01/2020   TRIG  87 03/16/2020   TRIG 61 09/13/2019   Lab Results  Component Value Date   CHOLHDL 3.0 11/01/2020   CHOLHDL 3.2 03/16/2020   CHOLHDL 3.2 09/13/2019   No results found for: LDLDIRECT Based on the results of lipid panel his/her cardiovascular risk factor ( using [facility] )  in the next 10 years is: The 10-year ASCVD risk score (Arnett DK, et al., 2019) is: 14.1%   Values used to calculate the score:     Age: 43 years     Sex: Female     Is Non-Hispanic African American: Yes     Diabetic: Yes     Tobacco smoker: No     Systolic Blood Pressure: 737 mmHg     Is BP treated: Yes     HDL Cholesterol: 53 mg/dL     Total Cholesterol: 160 mg/dL  Glucose:  Glucose, Bld  Date Value Ref Range Status  11/01/2020 101 (H) 65 - 99 mg/dL Final    Comment:    .            Fasting reference interval . For someone without known diabetes, a glucose value between 100 and 125 mg/dL is consistent with prediabetes and should be confirmed with a follow-up test. .   03/16/2020 112 (H) 65 - 99 mg/dL Final    Comment:    .            Fasting reference interval . For someone without known diabetes, a glucose value between 100 and 125 mg/dL is consistent with prediabetes and should be confirmed with a follow-up test. .   09/13/2019 105 (H) 65 - 99 mg/dL Final    Comment:    .            Fasting reference interval . For someone without known diabetes, a glucose value between 100 and 125 mg/dL is consistent with prediabetes and should be confirmed with a follow-up test. .     Advanced Care Planning:  A voluntary discussion about advance care planning including the explanation and  discussion of advance directives.   Discussed health care proxy and Living will  Social History       Social History   Socioeconomic History   Marital status: Divorced    Spouse name: Not on file   Number of children: 2   Years of education: Not on file   Highest education level: High school graduate  Occupational History   Occupation: retired  Tobacco Use   Smoking status: Never    Passive exposure: Yes   Smokeless tobacco: Never  Vaping Use   Vaping Use: Never used  Substance and Sexual Activity   Alcohol use: No    Comment: rare (1-2 drinks/yr)   Drug use: No   Sexual activity: Not Currently    Birth control/protection: Surgical  Other Topics Concern   Not on file  Social History Narrative   Not on file   Social Determinants of Health   Financial Resource Strain: Unknown   Difficulty of Paying Living Expenses: Patient refused  Food Insecurity: No Food Insecurity   Worried About Running Out of Food in the Last Year: Never true   [place] in the Last Year: Never true  Transportation Needs: No Transportation Needs   Lack of Transportation (Medical): No   Lack of Transportation (Non-Medical): No  Physical Activity: Sufficiently Active   Days of Exercise per Week: 3 days   Minutes of Exercise per Session: 60  min  Stress: Unknown   Feeling of Stress : Patient refused  Social Connections: Moderately Integrated   Frequency of Communication with Friends and Family: More than three times a week   Frequency of Social Gatherings with Friends and Family: More than three times a week   Attends Religious Services: 1 to 4 times per year   Active Member of [employer] or Organizations: No   Attends Music therapist: 1 to 4 times per year   Marital Status: Divorced    Family History        Family History  Problem Relation Age of Onset   Diabetes Mother    Hypertension Mother    Cancer Mother        possible liver   Heart disease Father    Hypertension  Father    Heart attack Father    Alzheimer's disease Father    Diabetes Sister    Hypertension Sister    Cancer Sister        lung?   Stroke Sister    Atrial fibrillation Sister    Breast cancer Maternal Grandmother    Cancer Sister    Lymphoma Sister    Stroke Sister     Patient Active Problem List   Diagnosis Date Noted   Hiatal hernia 11/10/2017   Hypertension 06/13/2017   Dysfunction of left eustachian tube 10/01/2015   Vitamin D deficiency 10/01/2015   Prediabetes 09/27/2015   Perforated nasal septum 08/25/2015   GERD (gastroesophageal reflux disease)    Osteoarthritis    Hyperlipidemia    Sleep apnea    DDD (degenerative disc disease), lumbar    Insomnia    Recurrent major depression ([place])    Migraine    Pituitary adenoma ([place]) 11/12/2013   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    Past Surgical History:  Procedure Laterality Date   ABDOMINAL HYSTERECTOMY     partial with bladder suspension, no cervix   CHOLECYSTECTOMY  2003   COLONOSCOPY  2008   1 polyp- return 10 years   COLONOSCOPY WITH PROPOFOL N/A 06/30/2017   Procedure: COLONOSCOPY WITH PROPOFOL;  Surgeon: Lucilla Lame, MD;  Location: [place];  Service: Endoscopy;  Laterality: N/A;  slep apnea   KNEE SURGERY Right 2014   PITUITARY SURGERY     x2 30+ years ago   transnasal approach pituitary adenoma resection  2015   clival/parasellar mass     Current Outpatient Medications:    amLODipine (NORVASC) 10 MG tablet, TAKE 1 TABLET BY MOUTH EVERY DAY FOR BLOOD PRESSURE, Disp: 90 tablet, Rfl: 3   cholecalciferol (VITAMIN D3) 25 MCG (1000 UT) tablet, Take 1,000 Units by mouth daily., Disp: , Rfl:    fluticasone (FLONASE) 50 MCG/ACT nasal spray, PLACE 2 SPRAYS INTO BOTH NOSTRILS AS NEEDED., Disp: 16 g, Rfl: 3   losartan (COZAAR) 100 MG tablet, Take 1 tablet (100 mg total) by mouth daily., Disp: 90 tablet, Rfl: 3   polyethylene glycol powder (GLYCOLAX/MIRALAX) powder, as needed. , Disp: , Rfl: 0  Allergies   Allergen Reactions   Lisinopril Cough        Shrimp [Shellfish Allergy] Swelling    throat swells (lobster also)   Sulfa Antibiotics Swelling    Patient Care Team: Jean Grana, PA-C as PCP - General (Family Medicine) Rockey Situ Kathlene November, MD as PCP - Cardiology (Cardiology)   Chart Review: I personally reviewed active problem list, medication list, allergies, family history, social history, health maintenance, notes from last encounter, lab results, imaging  with the patient/caregiver today.   Review of Systems  Constitutional: Negative.   HENT: Negative.    Eyes: Negative.   Respiratory: Negative.    Cardiovascular: Negative.   Gastrointestinal: Negative.   Endocrine: Negative.   Genitourinary: Negative.   Musculoskeletal: Negative.   Skin: Negative.   Allergic/Immunologic: Negative.   Neurological: Negative.   Hematological: Negative.   Psychiatric/Behavioral: Negative.    All other systems reviewed and are negative.        Objective:   Vitals:  Vitals:   04/09/21 0940  BP: 114/64  Pulse: 86  Resp: 16  Temp: 97.9 F (36.6 C)  TempSrc: Oral  SpO2: 99%  Weight: 204 lb 3.2 oz (92.6 kg)  Height: $Remove'5\' 6"'mveTURI$  (1.676 m)    Body mass index is 32.96 kg/m.  Physical Exam Vitals and nursing note reviewed.  Constitutional:      General: She is not in acute distress.    Appearance: Normal appearance. She is well-developed. She is obese. She is not ill-appearing, toxic-appearing or diaphoretic.     Interventions: Face mask in place.  HENT:     Head: Normocephalic and atraumatic.     Right Ear: Tympanic membrane, ear canal and external ear normal. There is no impacted cerumen.     Left Ear: Tympanic membrane, ear canal and external ear normal. There is no impacted cerumen.     Nose: No mucosal edema, congestion or rhinorrhea.     Right Turbinates: Enlarged.     Left Turbinates: Enlarged.     Mouth/Throat:     Mouth: Mucous membranes are moist.     Pharynx: Oropharynx  is clear. No oropharyngeal exudate or posterior oropharyngeal erythema.  Eyes:     General: Lids are normal. No scleral icterus.       Right eye: No discharge.        Left eye: No discharge.     Conjunctiva/sclera: Conjunctivae normal.     Pupils: Pupils are equal, round, and reactive to light.  Neck:     Trachea: Phonation normal. No tracheal deviation.  Cardiovascular:     Rate and Rhythm: Normal rate and regular rhythm.     Pulses: Normal pulses.          Radial pulses are 2+ on the right side and 2+ on the left side.       Posterior tibial pulses are 2+ on the right side and 2+ on the left side.     Heart sounds: Normal heart sounds. No murmur heard.   No friction rub. No gallop.  Pulmonary:     Effort: Pulmonary effort is normal. No respiratory distress.     Breath sounds: Normal breath sounds. No stridor. No wheezing, rhonchi or rales.  Chest:     Chest wall: No tenderness.  Abdominal:     General: Bowel sounds are normal. There is no distension.     Palpations: Abdomen is soft.     Tenderness: There is no abdominal tenderness. There is no guarding.  Musculoskeletal:     Right lower leg: No edema.     Left lower leg: No edema.  Skin:    General: Skin is warm and dry.     Coloration: Skin is not jaundiced or pale.     Findings: No rash.  Neurological:     Mental Status: She is alert.     Motor: No abnormal muscle tone.     Gait: Gait normal.  Psychiatric:  Mood and Affect: Mood normal.        Speech: Speech normal.        Behavior: Behavior normal.      Fall Risk: Fall Risk  04/09/2021 11/08/2020 11/08/2020 10/26/2020 03/16/2020  Falls in the past year? 0 0 0 0 0  Number falls in past yr: 0 0 0 0 0  Injury with Fall? 0 - 0 0 0  Risk for fall due to : No Fall Risks - - - -  Follow up Falls prevention discussed Falls evaluation completed - - Falls evaluation completed    Functional Status Survey: Is the patient deaf or have difficulty hearing?: No Does the  patient have difficulty seeing, even when wearing glasses/contacts?: No Does the patient have difficulty concentrating, remembering, or making decisions?: No Does the patient have difficulty walking or climbing stairs?: No Does the patient have difficulty dressing or bathing?: No Does the patient have difficulty doing errands alone such as visiting a doctor's office or shopping?: No   Assessment & Plan:    CPE completed today  USPSTF grade A and B recommendations reviewed with patient; age-appropriate recommendations, preventive care, screening tests, etc discussed and encouraged; healthy living encouraged; see AVS for patient education given to patient  Discussed importance of 150 minutes of physical activity weekly, AHA exercise recommendations given to pt in AVS/handout  Discussed importance of healthy diet:  eating lean meats and proteins, avoiding trans fats and saturated fats, avoid simple sugars and excessive carbs in diet, eat 6 servings of fruit/vegetables daily and drink plenty of water and avoid sweet beverages.    Recommended pt to do annual eye exam and routine dental exams/cleanings  Depression, alcohol, fall screening completed as documented above and per flowsheets  Advance Care planning information and packet discussed and offered today, encouraged pt to discuss with family members/spouse/partner/friends and complete Advanced directive packet and bring copy to office   Reviewed Health Maintenance: Health Maintenance  Topic Date Due   Pneumonia Vaccine 105+ Years old (1 - PCV) Never done   Zoster Vaccines- Shingrix (1 of 2) Never done   COVID-19 Vaccine (4 - Booster for Moderna series) 10/13/2020   MAMMOGRAM  03/07/2022   TETANUS/TDAP  03/27/2025   COLONOSCOPY (Pts 45-18yrs Insurance coverage will need to be confirmed)  07/01/2027   DEXA SCAN  Completed   Hepatitis C Screening  Completed   HPV VACCINES  Aged Out   INFLUENZA VACCINE  Discontinued     Immunizations: Immunization History  Administered Date(s) Administered   Hep A / Hep B 04/14/2018, 05/22/2018   Moderna SARS-COV2 Booster Vaccination 03/09/2020, 08/18/2020   Moderna Sars-Covid-2 Vaccination 07/21/2019, 08/18/2019, 03/09/2020   Tdap 03/28/2015   Vaccines:  HPV: up to at age 52 , ask insurance if age between 20-45  Shingrix: 33-64 yo and ask insurance if covered when patient above 46 yo Pneumonia: pt refused - explained benefit to pt and encouraged to get -  educated and discussed with patient. Flu: done educated and discussed with patient. COVID:  due for last boster    ICD-10-CM   1. Annual physical exam  Z00.00 CBC with Differential/Platelet    COMPLETE METABOLIC PANEL WITH GFR    Hemoglobin A1c    Lipid panel    2. Primary hypertension  H70 COMPLETE METABOLIC PANEL WITH GFR   BP at goal today    3. Mixed hyperlipidemia  E78.2 CBC with Differential/Platelet    COMPLETE METABOLIC PANEL WITH GFR    Lipid  panel   Not currently on medication    4. Prediabetes  X41.59 COMPLETE METABOLIC PANEL WITH GFR    Hemoglobin A1c   Recheck    5. Moderate episode of recurrent major depressive disorder (HCC)  F33.1    Mood good see HPI    6. Gastroesophageal reflux disease, unspecified whether esophagitis present  K21.9     7. 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 Floyce Stakes at [facility] for the past 35 years she will send follow-up message with info re referral to get back in for f/up          Jean Grana, PA-C 04/09/21 10:12 AM  [facility]

## 2021-04-09 NOTE — Patient Instructions (Signed)
Preventive Care 40 Years and Older, Female Preventive care refers to lifestyle choices and visits with your health care provider that can promote health and wellness. Preventive care visits are also called wellness exams. What can I expect for my preventive care visit? Counseling Your health care provider may ask you questions about your: Medical history, including: Past medical problems. Family medical history. Pregnancy and menstrual history. History of falls. Current health, including: Memory and ability to understand (cognition). Emotional well-being. Home life and relationship well-being. Sexual activity and sexual health. Lifestyle, including: Alcohol, nicotine or tobacco, and drug use. Access to firearms. Diet, exercise, and sleep habits. Work and work Statistician. Sunscreen use. Safety issues such as seatbelt and bike helmet use. Physical exam Your health care provider will check your: Height and weight. These may be used to calculate your BMI (body mass index). BMI is a measurement that tells if you are at a healthy weight. Waist circumference. This measures the distance around your waistline. This measurement also tells if you are at a healthy weight and may help predict your risk of certain diseases, such as type 2 diabetes and high blood pressure. Heart rate and blood pressure. Body temperature. Skin for abnormal spots. What immunizations do I need? Vaccines are usually given at various ages, according to a schedule. Your health care provider will recommend vaccines for you based on your age, medical history, and lifestyle or other factors, such as travel or where you work. What tests do I need? Screening Your health care provider may recommend screening tests for certain conditions. This may include: Lipid and cholesterol levels. Hepatitis C test. Hepatitis B test. HIV (human immunodeficiency virus) test. STI (sexually transmitted infection) testing, if you are at  risk. Lung cancer screening. Colorectal cancer screening. Diabetes screening. This is done by checking your blood sugar (glucose) after you have not eaten for a while (fasting). Mammogram. Talk with your health care provider about how often you should have regular mammograms. BRCA-related cancer screening. This may be done if you have a family history of breast, ovarian, tubal, or peritoneal cancers. Bone density scan. This is done to screen for osteoporosis. Talk with your health care provider about your test results, treatment options, and if necessary, the need for more tests. Follow these instructions at home: Eating and drinking  Eat a diet that includes fresh fruits and vegetables, whole grains, lean protein, and low-fat dairy products. Limit your intake of foods with high amounts of sugar, saturated fats, and salt. Take vitamin and mineral supplements as recommended by your health care provider. Do not drink alcohol if your health care provider tells you not to drink. If you drink alcohol: Limit how much you have to 0-1 drink a day. Know how much alcohol is in your drink. In the [place] one drink equals one 12 oz bottle of beer (355 mL), one 5 oz glass of wine (148 mL), or one 1 oz glass of hard liquor (44 mL). Lifestyle Brush your teeth every morning and night with fluoride toothpaste. Floss one time each day. Exercise for at least 30 minutes 5 or more days each week. Do not use any products that contain nicotine or tobacco. These products include cigarettes, chewing tobacco, and vaping devices, such as e-cigarettes. If you need help quitting, ask your health care provider. Do not use drugs. If you are sexually active, practice safe sex. Use a condom or other form of protection in order to prevent STIs. Take aspirin only as told by your  health care provider. Make sure that you understand how much to take and what form to take. Work with your health care provider to find out whether it  is safe and beneficial for you to take aspirin daily. Ask your health care provider if you need to take a cholesterol-lowering medicine (statin). Find healthy ways to manage stress, such as: Meditation, yoga, or listening to music. Journaling. Talking to a trusted person. Spending time with friends and family. Minimize exposure to UV radiation to reduce your risk of skin cancer. Safety Always wear your seat belt while driving or riding in a vehicle. Do not drive: If you have been drinking alcohol. Do not ride with someone who has been drinking. When you are tired or distracted. While texting. If you have been using any mind-altering substances or drugs. Wear a helmet and other protective equipment during sports activities. If you have firearms in your house, make sure you follow all gun safety procedures. What's next? Visit your health care provider once a year for an annual wellness visit. Ask your health care provider how often you should have your eyes and teeth checked. Stay up to date on all vaccines. This information is not intended to replace advice given to you by your health care provider. Make sure you discuss any questions you have with your health care provider. Document Revised: 10/25/2020 Document Reviewed: 10/25/2020 Elsevier Patient Education  [place].

## 2021-04-10 LAB — COMPLETE METABOLIC PANEL WITH GFR
AG Ratio: 1.3 (calc) (ref 1.0–2.5)
ALT: 14 U/L (ref 6–29)
AST: 15 U/L (ref 10–35)
Albumin: 4 g/dL (ref 3.6–5.1)
Alkaline phosphatase (APISO): 126 U/L (ref 37–153)
BUN: 8 mg/dL (ref 7–25)
CO2: 28 mmol/L (ref 20–32)
Calcium: 9.4 mg/dL (ref 8.6–10.4)
Chloride: 107 mmol/L (ref 98–110)
Creat: 0.79 mg/dL (ref 0.50–1.05)
Globulin: 3.2 g/dL (calc) (ref 1.9–3.7)
Glucose, Bld: 106 mg/dL — ABNORMAL HIGH (ref 65–99)
Potassium: 4.4 mmol/L (ref 3.5–5.3)
Sodium: 142 mmol/L (ref 135–146)
Total Bilirubin: 0.3 mg/dL (ref 0.2–1.2)
Total Protein: 7.2 g/dL (ref 6.1–8.1)
eGFR: 82 mL/min/{1.73_m2} (ref >=60)

## 2021-04-10 LAB — CBC WITH DIFFERENTIAL/PLATELET
Absolute Monocytes: 511 cells/uL (ref 200–950)
Basophils Absolute: 18 cells/uL (ref 0–200)
Basophils Relative: 0.4 %
Eosinophils Absolute: 60 cells/uL (ref 15–500)
Eosinophils Relative: 1.3 %
HCT: 37 % (ref 35.0–45.0)
Hemoglobin: 11.9 g/dL (ref 11.7–15.5)
Lymphs Abs: 1651 cells/uL (ref 850–3900)
MCH: 26.9 pg — ABNORMAL LOW (ref 27.0–33.0)
MCHC: 32.2 g/dL (ref 32.0–36.0)
MCV: 83.5 fL (ref 80.0–100.0)
MPV: 10.3 fL (ref 7.5–12.5)
Monocytes Relative: 11.1 %
Neutro Abs: 2360 cells/uL (ref 1500–7800)
Neutrophils Relative %: 51.3 %
Platelets: 394 10*3/uL (ref 140–400)
RBC: 4.43 10*6/uL (ref 3.80–5.10)
RDW: 14.3 % (ref 11.0–15.0)
Total Lymphocyte: 35.9 %
WBC: 4.6 10*3/uL (ref 3.8–10.8)

## 2021-04-10 LAB — LIPID PANEL
Cholesterol: 189 mg/dL (ref <200)
HDL: 52 mg/dL (ref >=50)
LDL Cholesterol (Calc): 115 mg/dL (calc) — ABNORMAL HIGH
Non-HDL Cholesterol (Calc): 137 mg/dL (calc) — ABNORMAL HIGH (ref <130)
Total CHOL/HDL Ratio: 3.6 (calc) (ref <5.0)
Triglycerides: 117 mg/dL (ref <150)

## 2021-04-10 LAB — HEMOGLOBIN A1C
Hgb A1c MFr Bld: 6.3 % of total Hgb — ABNORMAL HIGH (ref <5.7)
Mean Plasma Glucose: 134 mg/dL
eAG (mmol/L): 7.4 mmol/L

## 2021-04-24 ENCOUNTER — Ambulatory Visit: Payer: Self-pay | Admitting: *Deleted

## 2021-04-24 ENCOUNTER — Other Ambulatory Visit: Payer: Self-pay

## 2021-04-24 ENCOUNTER — Encounter: Payer: Self-pay | Admitting: Family Medicine

## 2021-04-24 ENCOUNTER — Other Ambulatory Visit: Payer: Self-pay | Admitting: Family Medicine

## 2021-04-24 ENCOUNTER — Ambulatory Visit (INDEPENDENT_AMBULATORY_CARE_PROVIDER_SITE_OTHER): Payer: Medicare Other | Admitting: Family Medicine

## 2021-04-24 VITALS — BP 122/62 | HR 97 | Temp 98.4°F | Resp 16 | Ht 66.0 in | Wt 205.4 lb

## 2021-04-24 DIAGNOSIS — S70362A Insect bite (nonvenomous), left thigh, initial encounter: Secondary | ICD-10-CM | POA: Diagnosis not present

## 2021-04-24 DIAGNOSIS — W57XXXA Bitten or stung by nonvenomous insect and other nonvenomous arthropods, initial encounter: Secondary | ICD-10-CM | POA: Diagnosis not present

## 2021-04-24 DIAGNOSIS — T148XXA Other injury of unspecified body region, initial encounter: Secondary | ICD-10-CM

## 2021-04-24 DIAGNOSIS — E782 Mixed hyperlipidemia: Secondary | ICD-10-CM

## 2021-04-24 MED ORDER — ROSUVASTATIN CALCIUM 5 MG PO TABS: 5.0000 mg | ORAL_TABLET | Freq: Every day | ORAL | 3 refills | Status: DC

## 2021-04-24 NOTE — Progress Notes (Signed)
Patient ID: [name], female    DOB: [birth year], 65 y.o.   MRN: [id number]  PCP: Delsa Grana, PA-C  Chief Complaint  Patient presents with   Insect Bite    Left thigh, Sat noticed it being dark and itching but as of today no itching.    Subjective:   [name] is a 65 y.o. female, presents to clinic with CC of the following:  HPI  Pt presents with large bruise to left proximal thigh/groin area that developed after what she thought was a bug bite 3 days ago.  She felt something bite her and she hit it with her hand.  She had mild itching that night to same are and states it felt like there was a skin tag there.  Itching has stopped but the same area developed red speckles and spots and then it turned into a purple bruise. She denies pain, swelling, redness, discharge, streaking redness, generalized myalgias, malaise, arthralgias, fever, chills, sweats, lymphadenopathy, headaches, sore throat.  She presents today at the urging of her sisters who are very concerned about the way this looks.  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 , Disp: , Rfl: 0   rosuvastatin (CRESTOR) 5 MG tablet, Take 1 tablet (5 mg total) by mouth at bedtime., Disp: 90 tablet, Rfl: 3   Allergies  Allergen Reactions   Lisinopril Cough        Shrimp [Shellfish Allergy] Swelling    throat swells (lobster also)   Sulfa Antibiotics Swelling     Social History   Tobacco Use   Smoking status: Never    Passive exposure: Yes   Smokeless tobacco: Never  Vaping Use   Vaping Use: Never used  Substance Use Topics   Alcohol use: No    Comment: rare (1-2 drinks/yr)   Drug use: No      Chart Review Today: I personally reviewed active problem list, medication list, allergies, family history, social history, health maintenance, notes from last encounter, lab results, imaging with the patient/caregiver today.   Review of Systems  Constitutional: Negative.   HENT: Negative.    Eyes: Negative.   Respiratory: Negative.    Cardiovascular: Negative.   Gastrointestinal: Negative.   Endocrine: Negative.   Genitourinary: Negative.   Musculoskeletal: Negative.  Skin: Negative.   Allergic/Immunologic: Negative.   Neurological: Negative.   Hematological: Negative.   Psychiatric/Behavioral: Negative.    All other systems reviewed and are negative.     Objective:   Vitals:   04/24/21 1348  BP: 122/62  Pulse: 97  Resp: 16  Temp: 98.4 F (36.9 C)  TempSrc: Oral  SpO2: 97%  Weight: 205 lb 6.4 oz (93.2 kg)  Height: $Remove'5\' 6"'yLdwoue$  (1.676 m)    Body mass index is 33.15 kg/m.  Physical Exam Vitals reviewed.  Constitutional:      General: She is not in acute distress.    Appearance: She is obese. She is not ill-appearing, toxic-appearing or diaphoretic.  HENT:     Head: Normocephalic and atraumatic.      Right Ear: External ear normal.     Left Ear: External ear normal.  Eyes:     General: No scleral icterus.       Right eye: No discharge.        Left eye: No discharge.     Conjunctiva/sclera: Conjunctivae normal.  Cardiovascular:     Rate and Rhythm: Normal rate and regular rhythm.     Pulses: Normal pulses.     Heart sounds: Normal heart sounds.  Pulmonary:     Effort: Pulmonary effort is normal.     Breath sounds: Normal breath sounds.  Abdominal:     General: Bowel sounds are normal.  Skin:    General: Skin is warm and dry.     Coloration: Skin is not jaundiced or pale.     Findings: Bruising and lesion present. No erythema or rash.     Comments: Left proximal thigh and groin area with approximately 8 x 6 cm area of deep purple bruising with very mild edema, no induration, fluctuance, ttp, erythema or warmth Superior to bruised area a small dry papule No drainage No fb/implanted insect/bug/tick No other areas on body of bruising/petechia  Neurological:     Mental Status: She is alert. Mental status is at baseline.  Psychiatric:        Mood and Affect: Mood normal.     Results for orders placed or performed in visit on 04/09/21  CBC with Differential/Platelet  Result Value Ref Range   WBC 4.6 3.8 - 10.8 Thousand/uL   RBC 4.43 3.80 - 5.10 Million/uL   Hemoglobin 11.9 11.7 - 15.5 g/dL   HCT 37.0 35.0 - 45.0 %   MCV 83.5 80.0 - 100.0 fL   MCH 26.9 (L) 27.0 - 33.0 pg   MCHC 32.2 32.0 - 36.0 g/dL   RDW 14.3 11.0 - 15.0 %   Platelets 394 140 - 400 Thousand/uL   MPV 10.3 7.5 - 12.5 fL   Neutro Abs 2,360 1,500 - 7,800 cells/uL   Lymphs Abs 1,651 850 - 3,900 cells/uL   Absolute Monocytes 511 200 - 950 cells/uL   Eosinophils Absolute 60 15 - 500 cells/uL   Basophils Absolute 18 0 - 200 cells/uL   Neutrophils Relative % 51.3 %   Total Lymphocyte 35.9 %   Monocytes Relative 11.1 %   Eosinophils Relative 1.3 %   Basophils Relative 0.4 %  COMPLETE METABOLIC PANEL WITH GFR   Result Value Ref Range   Glucose, Bld 106 (H) 65 - 99 mg/dL   BUN 8 7 - 25 mg/dL   Creat 0.79 0.50 - 1.05 mg/dL   eGFR 82 > OR = 60 mL/min/1.97m2   BUN/Creatinine Ratio NOT APPLICABLE 6 - 22 (calc)  Sodium 142 135 - 146 mmol/L   Potassium 4.4 3.5 - 5.3 mmol/L   Chloride 107 98 - 110 mmol/L   CO2 28 20 - 32 mmol/L   Calcium 9.4 8.6 - 10.4 mg/dL   Total Protein 7.2 6.1 - 8.1 g/dL   Albumin 4.0 3.6 - 5.1 g/dL   Globulin 3.2 1.9 - 3.7 g/dL (calc)   AG Ratio 1.3 1.0 - 2.5 (calc)   Total Bilirubin 0.3 0.2 - 1.2 mg/dL   Alkaline phosphatase (APISO) 126 37 - 153 U/L   AST 15 10 - 35 U/L   ALT 14 6 - 29 U/L  Hemoglobin A1c  Result Value Ref Range   Hgb A1c MFr Bld 6.3 (H) <5.7 % of total Hgb   Mean Plasma Glucose 134 mg/dL   eAG (mmol/L) 7.4 mmol/L  Lipid panel  Result Value Ref Range   Cholesterol 189 <200 mg/dL   HDL 52 > OR = 50 mg/dL   Triglycerides 117 <150 mg/dL   LDL Cholesterol (Calc) 115 (H) mg/dL (calc)   Total CHOL/HDL Ratio 3.6 <5.0 (calc)   Non-HDL Cholesterol (Calc) 137 (H) <130 mg/dL (calc)       Assessment & Plan:     ICD-10-CM   1. Insect bite of left thigh, initial encounter  S70.362A    W57.XXXA     2. Bruise  T14.8XXA      Possible bug bite 3 days ago with mild sx - itching that resolved, followed by some petechia and then deep purple bruising.  Other than abnormal appearance the patient has no complaints.  She has no other areas of bleeding bruising or petechia.  She has no tenderness redness warmth drainage or any constitutional symptoms.  It is unclear what bit or stung her but I suspect this is a local reaction and there is no appearance of infection or cellulitis at this time no abscess and no constitutional symptoms.  Patient was reassured, suspect bruise will fade over the next couple weeks, the edges of the bruise already appears to have some aging and yellowing.  Encouraged her to come for follow-up if she develops any constitutional symptoms such as  myalgias arthralgias fever chills sweats headache sore throat or she has any local symptoms that appear concerning for infection.  At this time do not feel there is any intervention needed     Delsa Grana, PA-C 04/24/21 3:24 PM

## 2021-04-24 NOTE — Telephone Encounter (Signed)
°  Chief Complaint: Insect bite, Friday Symptoms: Large darkened area left thigh, size of hand. Frequency:  Pertinent Negatives: Patient denies No itching no pain no fever Disposition: [] ED /[] Urgent Care (no appt availability in office) / [x] Appointment(In office/virtual)/ []  [facility]/ [] Home Care/ [] Refused Recommended Disposition  Additional Notes: Appt for this afternoon. Pt states "Just want it looked at it looks so horrible."  Area is now size of hand. Worsening since Friday       Reason for Disposition  [1] Red or very tender (to touch) area AND [2] started over 24 hours after the bite  Answer Assessment - Initial Assessment Questions 1. TYPE of INSECT: "What type of insect was it?"      Unsure 2. ONSET: "When did you get bitten?"      Friday 3. LOCATION: "Where is the insect bite located?"      Left thigh 4. REDNESS: "Is the area red or pink?" If Yes, ask: "What size is area of redness?" (inches or cm). "When did the redness start?"     Dark area 5. PAIN: "Is there any pain?" If Yes, ask: "How bad is it?"  (Scale 1-10; or mild, moderate, severe)     no 6. ITCHING: "Does it itch?" If Yes, ask: "How bad is the itch?"    - MILD: doesn't interfere with normal activities   - MODERATE-SEVERE: interferes with work, school, sleep, or other activities      no 7. SWELLING: "How big is the swelling?" (inches, cm, or compare to coins)     No 8. OTHER SYMPTOMS: "Do you have any other symptoms?"  (e.g., difficulty breathing, hives)     none  Protocols used: Insect Bite-A-AH

## 2021-10-09 ENCOUNTER — Ambulatory Visit: Payer: Medicare Other | Admitting: Family Medicine

## 2021-10-18 ENCOUNTER — Encounter: Payer: Self-pay | Admitting: Family Medicine

## 2021-10-18 ENCOUNTER — Ambulatory Visit (INDEPENDENT_AMBULATORY_CARE_PROVIDER_SITE_OTHER): Payer: Medicare Other | Admitting: Family Medicine

## 2021-10-18 VITALS — BP 132/82 | HR 68 | Temp 98.5°F | Resp 16 | Ht 66.0 in | Wt 202.9 lb

## 2021-10-18 DIAGNOSIS — K219 Gastro-esophageal reflux disease without esophagitis: Secondary | ICD-10-CM

## 2021-10-18 DIAGNOSIS — R7303 Prediabetes: Secondary | ICD-10-CM

## 2021-10-18 DIAGNOSIS — F331 Major depressive disorder, recurrent, moderate: Secondary | ICD-10-CM | POA: Diagnosis not present

## 2021-10-18 DIAGNOSIS — R350 Frequency of micturition: Secondary | ICD-10-CM

## 2021-10-18 DIAGNOSIS — I10 Essential (primary) hypertension: Secondary | ICD-10-CM | POA: Diagnosis not present

## 2021-10-18 DIAGNOSIS — E220 Acromegaly and pituitary gigantism: Secondary | ICD-10-CM

## 2021-10-18 DIAGNOSIS — D352 Benign neoplasm of pituitary gland: Secondary | ICD-10-CM

## 2021-10-18 DIAGNOSIS — E782 Mixed hyperlipidemia: Secondary | ICD-10-CM | POA: Diagnosis not present

## 2021-10-18 DIAGNOSIS — H6982 Other specified disorders of Eustachian tube, left ear: Secondary | ICD-10-CM

## 2021-10-18 NOTE — Patient Instructions (Signed)

## 2021-10-18 NOTE — Progress Notes (Signed)
Name: SEMIRA STOLTZFUS   MRN: [id number]    DOB: 1956/01/22   Date:10/18/2021       Progress Note  Chief Complaint  Patient presents with   Follow-up   Hypertension   Hyperlipidemia   Diabetes     Subjective:   ARDYTH KELSO is a 66 y.o. female, presents to clinic for routine f/up  Hypertension:  Currently managed on Norvasc 10 mg and losartan 100 mg daily Pt reports good med compliance and denies any SE.   Blood pressure today is well controlled. BP Readings from Last 3 Encounters:  10/18/21 132/82  04/24/21 122/62  04/09/21 114/64   Pt denies CP, SOB, exertional sx, LE edema, palpitation, Ha's, visual disturbances, lightheadedness, hypotension, syncope. Dietary efforts for BP?  Still is walking and eating healthy  Hyperlipidemia:   Currently treated with crestor 5 mg - hesitant to take and not taking every day and forgetful - mostly taking 1-2 x a week Last Lipids: Lab Results  Component Value Date   CHOL 189 04/09/2021   HDL 52 04/09/2021   LDLCALC 115 (H) 04/09/2021   TRIG 117 04/09/2021   CHOLHDL 3.6 04/09/2021   - Denies: Chest pain, shortness of breath, myalgias, claudication  Left ear clicking popping, pain ongoing for years, some hearing loss known with past specialists  Continued caregiver stress  She also reports urinary frequency which seems to be worse when taking care of her family members and slightly better when she is home   Current Outpatient Medications:    amLODipine (NORVASC) 10 MG tablet, TAKE 1 TABLET BY MOUTH EVERY DAY FOR BLOOD PRESSURE, Disp: 90 tablet, Rfl: 3   cholecalciferol (VITAMIN D3) 25 MCG (1000 UT) tablet, Take 1,000 Units by mouth daily., Disp: , Rfl:    fluticasone (FLONASE) 50 MCG/ACT nasal spray, PLACE 2 SPRAYS INTO BOTH NOSTRILS AS NEEDED., Disp: 16 g, Rfl: 3   losartan (COZAAR) 100 MG tablet, Take 1 tablet (100 mg total) by mouth daily., Disp: 90 tablet, Rfl: 3   polyethylene glycol powder (GLYCOLAX/MIRALAX) powder, as  needed. , Disp: , Rfl: 0   rosuvastatin (CRESTOR) 5 MG tablet, Take 1 tablet (5 mg total) by mouth at bedtime., Disp: 90 tablet, Rfl: 3  Patient Active Problem List   Diagnosis Date Noted   Hiatal hernia 11/10/2017   Hypertension 06/13/2017   Dysfunction of left eustachian tube 10/01/2015   Vitamin D deficiency 10/01/2015   Prediabetes 09/27/2015   Perforated nasal septum 08/25/2015   GERD (gastroesophageal reflux disease)    Osteoarthritis    Hyperlipidemia    Sleep apnea    DDD (degenerative disc disease), lumbar    Insomnia    Recurrent major depression ([place])    Migraine    Pituitary adenoma ([place]) 11/12/2013   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    Past Surgical History:  Procedure Laterality Date   ABDOMINAL HYSTERECTOMY     partial with bladder suspension, no cervix   CHOLECYSTECTOMY  2003   COLONOSCOPY  2008   1 polyp- return 10 years   COLONOSCOPY WITH PROPOFOL N/A 06/30/2017   Procedure: COLONOSCOPY WITH PROPOFOL;  Surgeon: Lucilla Lame, MD;  Location: [facility];  Service: Endoscopy;  Laterality: N/A;  slep apnea   KNEE SURGERY Right 2014   PITUITARY SURGERY     x2 30+ years ago   transnasal approach pituitary adenoma resection  2015   clival/parasellar mass    Family History  Problem Relation Age of Onset   Diabetes Mother  Hypertension Mother    Cancer Mother        possible liver   Heart disease Father    Hypertension Father    Heart attack Father    Alzheimer's disease Father    Diabetes Sister    Hypertension Sister    Cancer Sister        lung?   Stroke Sister    Atrial fibrillation Sister    Breast cancer Maternal Grandmother    Cancer Sister    Lymphoma Sister    Stroke Sister     Social History   Tobacco Use   Smoking status: Never    Passive exposure: Yes   Smokeless tobacco: Never  Vaping Use   Vaping Use: Never used  Substance Use Topics   Alcohol use: No    Comment: rare (1-2 drinks/yr)   Drug use: No     Allergies   Allergen Reactions   Lisinopril Cough        Shrimp [Shellfish Allergy] Swelling    throat swells (lobster also)   Sulfa Antibiotics Swelling    Health Maintenance  Topic Date Due   COVID-19 Vaccine (4 - Moderna series) 11/03/2021 (Originally 10/13/2020)   Zoster Vaccines- Shingrix (1 of 2) 01/18/2022 (Originally 12/25/2004)   Pneumonia Vaccine 90+ Years old (1 - PCV) 04/09/2022 (Originally 12/26/2019)   MAMMOGRAM  03/07/2022   DEXA SCAN  03/07/2024   TETANUS/TDAP  03/27/2025   COLONOSCOPY (Pts 45-20yr Insurance coverage will need to be confirmed)  07/01/2027   Hepatitis C Screening  Completed   HPV VACCINES  Aged Out   INFLUENZA VACCINE  Discontinued    Chart Review Today: I personally reviewed active problem list, medication list, allergies, family history, social history, health maintenance, notes from last encounter, lab results, imaging with the patient/caregiver today.   Review of Systems  Constitutional: Negative.   HENT: Negative.    Eyes: Negative.   Respiratory: Negative.    Cardiovascular: Negative.   Gastrointestinal: Negative.   Endocrine: Negative.   Genitourinary: Negative.   Musculoskeletal: Negative.   Skin: Negative.   Allergic/Immunologic: Negative.   Neurological: Negative.   Hematological: Negative.   Psychiatric/Behavioral: Negative.    All other systems reviewed and are negative.    Objective:   Vitals:   10/18/21 0839  BP: 132/82  Pulse: 68  Resp: 16  Temp: 98.5 F (36.9 C)  TempSrc: Oral  Weight: 202 lb 14.4 oz (92 kg)  Height: '5\' 6"'$  (1.676 m)    Body mass index is 32.75 kg/m.  Physical Exam Vitals and nursing note reviewed.  Constitutional:      General: She is not in acute distress.    Appearance: Normal appearance. She is well-developed. She is obese. She is not ill-appearing, toxic-appearing or diaphoretic.     Interventions: Face mask in place.  HENT:     Head: Normocephalic and atraumatic.     Right Ear: External ear  normal.     Left Ear: External ear normal.  Eyes:     General: Lids are normal. No scleral icterus.       Right eye: No discharge.        Left eye: No discharge.     Conjunctiva/sclera: Conjunctivae normal.  Neck:     Trachea: Phonation normal. No tracheal deviation.  Cardiovascular:     Rate and Rhythm: Normal rate and regular rhythm.     Pulses: Normal pulses.          Radial pulses are  2+ on the right side and 2+ on the left side.       Posterior tibial pulses are 2+ on the right side and 2+ on the left side.     Heart sounds: Normal heart sounds. No murmur heard.    No friction rub. No gallop.  Pulmonary:     Effort: Pulmonary effort is normal. No respiratory distress.     Breath sounds: Normal breath sounds. No stridor. No wheezing, rhonchi or rales.  Chest:     Chest wall: No tenderness.  Abdominal:     General: Bowel sounds are normal. There is no distension.     Palpations: Abdomen is soft.  Musculoskeletal:     Right lower leg: No edema.     Left lower leg: No edema.  Skin:    General: Skin is warm and dry.     Coloration: Skin is not jaundiced or pale.     Findings: No rash.  Neurological:     Mental Status: She is alert.     Motor: No abnormal muscle tone.     Gait: Gait normal.  Psychiatric:        Mood and Affect: Mood normal.        Speech: Speech normal.        Behavior: Behavior normal.         Assessment & Plan:   Problem List Items Addressed This Visit       Cardiovascular and Mediastinum   Hypertension    Well controlled, compliant with meds, no SE or concerning sx Recheck renal function and electrolytes Pt encouraged to continue to work on healthy diet (low salt) and lifestyle for improving HTN management - DASH diet handout offered today Continue losartan 100 mg qd and amlodipine 10 mg daily        Relevant Orders   COMPLETE METABOLIC PANEL WITH GFR (Completed)     Endocrine   Pituitary adenoma ([place])    Overdue for annual f/up with  [facility] specialists      Relevant Orders   Ambulatory referral to Endocrinology   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 Loney Loh [facility] endocrinologist for acromegaly and pituitary adenoma for 35 years was surgically cured per February 2018 note, and seen annually with [facility] endocrinology. They would monitor somatomedin C IGF-I, creatinine and hemoglobin A1c -lost to follow-up in the past 2 to 3 years it seems - Pt in 2016 is only on thyroid medication and Metformin, prior to that was on hydrocortisone but no longer required as of November 2016. Lost to f/up For the past 2-3 years pt has been saying she will let us know about who she wants to see for annual monitoring I will put in referral to reconnect her to [facility] specialty office in [place]        Relevant Orders   Ambulatory referral to Endocrinology     Nervous and Auditory   Dysfunction of left eustachian tube    Many years of recurrent sx, same today, exam unremarkable - no signs of effusion or infection Encouraged tx for ETD - antihistamines, steroid nasal sprays         Other   Hyperlipidemia - Primary    Poor compliance with crestor 5 mg -  mostly just forgetting to take in the evenings est when caring for her family members no SE, no myalgias, fatigue or jaundice Due for FLP and recheck CMP        Relevant Orders   COMPLETE METABOLIC PANEL WITH GFR (Completed)   Lipid panel (Completed)   Recurrent major depression ([place])    Continued caregiver and family stress Moods and other sx are better when she can go home She previously was referred to [facility] for caregiver support She isn't on meds, and currently not talking to a therapist She appears tired and worn out, phq neg and she has not interest in meds Encouraged  self-care/self-help and boundaries     10/18/2021    8:37 AM 04/24/2021    1:45 PM 04/09/2021    9:34 AM  Depression screen PHQ 2/9  Decreased Interest 0 0 0  Down, Depressed, Hopeless 0 0 0  PHQ - 2 Score 0 0 0  Altered sleeping 0 0 0  Tired, decreased energy 0 0 0  Change in appetite 0 0 0  Feeling bad or failure about yourself  0 0 0  Trouble concentrating 0 0 0  Moving slowly or fidgety/restless 0 0 0  Suicidal thoughts 0 0 0  PHQ-9 Score 0 0 0  Difficult doing work/chores Not difficult at all Not difficult at all Not difficult at all         Prediabetes    Not on meds, monitoring labs, previously on metformin      Relevant Orders   Hemoglobin A1C (Completed)   COMPLETE METABOLIC PANEL WITH GFR (Completed)   Other Visit Diagnoses     Urinary frequency       screened urine due to mention of urinary frequency, no dysuria/hematuria   Relevant Orders   Hemoglobin A1C (Completed)   Lipid panel (Completed)   Urine Microalbumin w/creat. ratio (Completed)   Urinalysis, Routine w reflex microscopic   Urinalysis, Routine w reflex microscopic (Completed)   MICROSCOPIC MESSAGE (Completed)        6 month f/up  Delsa Grana, PA-C 10/18/21 9:08 AM

## 2021-10-19 ENCOUNTER — Telehealth: Payer: Self-pay | Admitting: Family Medicine

## 2021-10-19 LAB — URINALYSIS, ROUTINE W REFLEX MICROSCOPIC
Bilirubin Urine: NEGATIVE
Glucose, UA: NEGATIVE
Ketones, ur: NEGATIVE
Nitrite: POSITIVE — AB
RBC / HPF: NONE SEEN /HPF (ref 0–2)
Specific Gravity, Urine: 1.02 (ref 1.001–1.035)
pH: 5.5 (ref 5.0–8.0)

## 2021-10-19 LAB — LIPID PANEL
Cholesterol: 174 mg/dL (ref <200)
HDL: 57 mg/dL (ref >=50)
LDL Cholesterol (Calc): 104 mg/dL (calc) — ABNORMAL HIGH
Non-HDL Cholesterol (Calc): 117 mg/dL (calc) (ref <130)
Total CHOL/HDL Ratio: 3.1 (calc) (ref <5.0)
Triglycerides: 52 mg/dL (ref <150)

## 2021-10-19 LAB — HEMOGLOBIN A1C
Hgb A1c MFr Bld: 6.2 % of total Hgb — ABNORMAL HIGH (ref <5.7)
Mean Plasma Glucose: 131 mg/dL
eAG (mmol/L): 7.3 mmol/L

## 2021-10-19 LAB — COMPLETE METABOLIC PANEL WITH GFR
AG Ratio: 1.3 (calc) (ref 1.0–2.5)
ALT: 13 U/L (ref 6–29)
AST: 14 U/L (ref 10–35)
Albumin: 4 g/dL (ref 3.6–5.1)
Alkaline phosphatase (APISO): 124 U/L (ref 37–153)
BUN: 10 mg/dL (ref 7–25)
CO2: 29 mmol/L (ref 20–32)
Calcium: 9.7 mg/dL (ref 8.6–10.4)
Chloride: 108 mmol/L (ref 98–110)
Creat: 0.86 mg/dL (ref 0.50–1.05)
Globulin: 3.2 g/dL (calc) (ref 1.9–3.7)
Glucose, Bld: 108 mg/dL — ABNORMAL HIGH (ref 65–99)
Potassium: 4.7 mmol/L (ref 3.5–5.3)
Sodium: 143 mmol/L (ref 135–146)
Total Bilirubin: 0.4 mg/dL (ref 0.2–1.2)
Total Protein: 7.2 g/dL (ref 6.1–8.1)
eGFR: 74 mL/min/{1.73_m2} (ref >=60)

## 2021-10-19 LAB — MICROALBUMIN / CREATININE URINE RATIO
Creatinine, Urine: 211 mg/dL (ref 20–275)
Microalb Creat Ratio: 18 mcg/mg creat (ref <30)
Microalb, Ur: 3.9 mg/dL

## 2021-10-19 LAB — MICROSCOPIC MESSAGE

## 2021-10-19 MED ORDER — FLUCONAZOLE 150 MG PO TABS: 150.0000 mg | ORAL_TABLET | ORAL | 0 refills | Status: AC | PRN

## 2021-10-19 MED ORDER — CEPHALEXIN 500 MG PO CAPS: 500.0000 mg | ORAL_CAPSULE | Freq: Three times a day (TID) | ORAL | 0 refills | Status: AC

## 2021-10-19 NOTE — Telephone Encounter (Signed)
[name] called her in an abx.  Pt states every time she takes an abx, she gets a yeast infection. Pt would like Diflucan for a yeast infection called in as well please.  [id number] [place] - [address]

## 2021-10-29 ENCOUNTER — Encounter: Payer: Self-pay | Admitting: Family Medicine

## 2021-10-29 NOTE — Assessment & Plan Note (Signed)
Hx of thrombocytosis back in 2017 with her last 2 office visits and labs this is shown to not be a current problem it was removed from problem list  History of pituitary adenoma and acromegaly, managed in the past by Dr. Loney Loh [facility] endocrinologist for acromegaly and pituitary adenoma for 35 years was surgically cured per February 2018 note, and seen annually with [facility] endocrinology. They would monitor somatomedin C IGF-I, creatinine and hemoglobin A1c -lost to follow-up in the past 2 to 3 years it seems - Pt in 2016 is only on thyroid medication and Metformin, prior to that was on hydrocortisone but no longer required as of November 2016. Lost to f/up For the past 2-3 years pt has been saying she will let us know about who she wants to see for annual monitoring I will put in referral to reconnect her to [facility] specialty office in [place]

## 2021-10-29 NOTE — Assessment & Plan Note (Signed)
Not on meds, monitoring labs, previously on metformin

## 2021-10-29 NOTE — Assessment & Plan Note (Signed)
Well controlled, compliant with meds, no SE or concerning sx Recheck renal function and electrolytes Pt encouraged to continue to work on healthy diet (low salt) and lifestyle for improving HTN management - DASH diet handout offered today Continue losartan 100 mg qd and amlodipine 10 mg daily

## 2021-10-29 NOTE — Assessment & Plan Note (Signed)
Many years of recurrent sx, same today, exam unremarkable - no signs of effusion or infection Encouraged tx for ETD - antihistamines, steroid nasal sprays

## 2021-10-29 NOTE — Assessment & Plan Note (Signed)
Overdue for annual f/up with [facility] specialists

## 2021-10-29 NOTE — Assessment & Plan Note (Signed)
Continued caregiver and family stress Moods and other sx are better when she can go home She previously was referred to [facility] for caregiver support She isn't on meds, and currently not talking to a therapist She appears tired and worn out, phq neg and she has not interest in meds Encouraged self-care/self-help and boundaries     10/18/2021    8:37 AM 04/24/2021    1:45 PM 04/09/2021    9:34 AM  Depression screen PHQ 2/9  Decreased Interest 0 0 0  Down, Depressed, Hopeless 0 0 0  PHQ - 2 Score 0 0 0  Altered sleeping 0 0 0  Tired, decreased energy 0 0 0  Change in appetite 0 0 0  Feeling bad or failure about yourself  0 0 0  Trouble concentrating 0 0 0  Moving slowly or fidgety/restless 0 0 0  Suicidal thoughts 0 0 0  PHQ-9 Score 0 0 0  Difficult doing work/chores Not difficult at all Not difficult at all Not difficult at all

## 2021-10-29 NOTE — Assessment & Plan Note (Signed)
Poor compliance with crestor 5 mg - mostly just forgetting to take in the evenings est when caring for her family members no SE, no myalgias, fatigue or jaundice Due for FLP and recheck CMP

## 2021-11-03 ENCOUNTER — Other Ambulatory Visit: Payer: Self-pay | Admitting: Family Medicine

## 2021-11-03 DIAGNOSIS — I10 Essential (primary) hypertension: Secondary | ICD-10-CM

## 2022-02-22 ENCOUNTER — Other Ambulatory Visit: Payer: Self-pay | Admitting: Internal Medicine

## 2022-02-22 ENCOUNTER — Ambulatory Visit
Admission: RE | Admit: 2022-02-22 | Discharge: 2022-02-22 | Disposition: A | Discharge status: Home / Self Care | Payer: Medicare Other | Source: Ambulatory Visit | Attending: Internal Medicine | Admitting: Internal Medicine

## 2022-02-22 DIAGNOSIS — I5030 Unspecified diastolic (congestive) heart failure: Secondary | ICD-10-CM

## 2022-02-22 DIAGNOSIS — R0602 Shortness of breath: Secondary | ICD-10-CM

## 2022-03-22 ENCOUNTER — Ambulatory Visit: Payer: Medicare Other | Admitting: Family Medicine

## 2022-04-15 ENCOUNTER — Other Ambulatory Visit: Payer: Self-pay | Admitting: Internal Medicine

## 2022-04-15 DIAGNOSIS — Z1231 Encounter for screening mammogram for malignant neoplasm of breast: Secondary | ICD-10-CM

## 2022-04-26 ENCOUNTER — Other Ambulatory Visit: Payer: Self-pay | Admitting: Family Medicine

## 2022-04-26 DIAGNOSIS — E782 Mixed hyperlipidemia: Secondary | ICD-10-CM

## 2022-05-16 ENCOUNTER — Ambulatory Visit
Admission: RE | Admit: 2022-05-16 | Discharge: 2022-05-16 | Disposition: A | Discharge status: Home / Self Care | Payer: Medicare HMO | Source: Ambulatory Visit | Attending: Internal Medicine | Admitting: Internal Medicine

## 2022-05-16 DIAGNOSIS — Z1231 Encounter for screening mammogram for malignant neoplasm of breast: Secondary | ICD-10-CM | POA: Insufficient documentation

## 2022-05-18 ENCOUNTER — Other Ambulatory Visit: Payer: Self-pay | Admitting: Family Medicine

## 2022-05-18 DIAGNOSIS — E782 Mixed hyperlipidemia: Secondary | ICD-10-CM

## 2022-11-13 ENCOUNTER — Other Ambulatory Visit: Payer: Self-pay | Admitting: Family Medicine

## 2022-11-13 DIAGNOSIS — E782 Mixed hyperlipidemia: Secondary | ICD-10-CM

## 2022-12-31 ENCOUNTER — Other Ambulatory Visit: Payer: Self-pay | Admitting: Family Medicine

## 2022-12-31 DIAGNOSIS — E782 Mixed hyperlipidemia: Secondary | ICD-10-CM

## 2023-02-19 ENCOUNTER — Encounter: Payer: Self-pay | Admitting: Internal Medicine

## 2023-02-24 ENCOUNTER — Encounter: Payer: Self-pay | Admitting: Internal Medicine

## 2023-02-24 DIAGNOSIS — N390 Urinary tract infection, site not specified: Secondary | ICD-10-CM

## 2023-02-26 ENCOUNTER — Other Ambulatory Visit: Payer: Self-pay | Admitting: Internal Medicine

## 2023-02-26 DIAGNOSIS — N179 Acute kidney failure, unspecified: Secondary | ICD-10-CM

## 2023-02-26 DIAGNOSIS — N390 Urinary tract infection, site not specified: Secondary | ICD-10-CM

## 2023-03-19 ENCOUNTER — Ambulatory Visit
Admission: RE | Admit: 2023-03-19 | Discharge: 2023-03-19 | Disposition: A | Discharge status: Home / Self Care | Payer: Medicare HMO | Source: Ambulatory Visit | Attending: Internal Medicine | Admitting: Internal Medicine

## 2023-03-19 DIAGNOSIS — N179 Acute kidney failure, unspecified: Secondary | ICD-10-CM

## 2023-03-19 DIAGNOSIS — N390 Urinary tract infection, site not specified: Secondary | ICD-10-CM

## 2023-03-19 MED ORDER — IOHEXOL 300 MG/ML  SOLN
100.0000 mL | Freq: Once | INTRAMUSCULAR | Status: AC | PRN
  Administered 2023-03-19: 100 mL via INTRAVENOUS

## 2023-04-01 ENCOUNTER — Other Ambulatory Visit: Payer: Self-pay | Admitting: Internal Medicine

## 2023-04-01 DIAGNOSIS — Z1231 Encounter for screening mammogram for malignant neoplasm of breast: Secondary | ICD-10-CM

## 2023-04-01 DIAGNOSIS — Z13820 Encounter for screening for osteoporosis: Secondary | ICD-10-CM

## 2023-05-20 ENCOUNTER — Ambulatory Visit: Payer: Medicare HMO

## 2023-05-20 ENCOUNTER — Other Ambulatory Visit: Payer: Medicare HMO

## 2023-05-28 ENCOUNTER — Ambulatory Visit
Admission: RE | Admit: 2023-05-28 | Discharge: 2023-05-28 | Disposition: A | Discharge status: Home / Self Care | Payer: Medicare HMO | Source: Ambulatory Visit | Attending: Internal Medicine

## 2023-05-28 ENCOUNTER — Ambulatory Visit
Admission: RE | Admit: 2023-05-28 | Discharge: 2023-05-28 | Disposition: A | Discharge status: Home / Self Care | Payer: Medicare HMO | Source: Ambulatory Visit | Attending: Internal Medicine | Admitting: Internal Medicine

## 2023-05-28 DIAGNOSIS — Z13820 Encounter for screening for osteoporosis: Secondary | ICD-10-CM | POA: Insufficient documentation

## 2023-05-28 DIAGNOSIS — Z780 Asymptomatic menopausal state: Secondary | ICD-10-CM | POA: Diagnosis not present

## 2023-05-28 DIAGNOSIS — Z1231 Encounter for screening mammogram for malignant neoplasm of breast: Secondary | ICD-10-CM | POA: Diagnosis present

## 2023-12-24 ENCOUNTER — Other Ambulatory Visit: Payer: Self-pay | Admitting: Internal Medicine

## 2023-12-24 DIAGNOSIS — Z86018 Personal history of other benign neoplasm: Secondary | ICD-10-CM

## 2023-12-24 DIAGNOSIS — H539 Unspecified visual disturbance: Secondary | ICD-10-CM

## 2023-12-24 DIAGNOSIS — R519 Headache, unspecified: Secondary | ICD-10-CM

## 2023-12-26 ENCOUNTER — Encounter (INDEPENDENT_AMBULATORY_CARE_PROVIDER_SITE_OTHER): Payer: Self-pay | Admitting: Vascular Surgery

## 2024-01-08 ENCOUNTER — Ambulatory Visit
Admission: RE | Admit: 2024-01-08 | Discharge: 2024-01-08 | Disposition: A | Discharge status: Home / Self Care | Source: Ambulatory Visit | Attending: Internal Medicine | Admitting: Internal Medicine

## 2024-01-08 DIAGNOSIS — R519 Headache, unspecified: Secondary | ICD-10-CM | POA: Insufficient documentation

## 2024-01-08 DIAGNOSIS — Z86018 Personal history of other benign neoplasm: Secondary | ICD-10-CM | POA: Diagnosis present

## 2024-01-08 DIAGNOSIS — H539 Unspecified visual disturbance: Secondary | ICD-10-CM | POA: Diagnosis present

## 2024-01-08 MED ORDER — GADOBUTROL 1 MMOL/ML IV SOLN
9.0000 mL | Freq: Once | INTRAVENOUS | Status: AC | PRN
  Administered 2024-01-08: 9 mL via INTRAVENOUS

## 2024-04-14 ENCOUNTER — Other Ambulatory Visit: Payer: Self-pay | Admitting: Internal Medicine

## 2024-04-14 DIAGNOSIS — Z1231 Encounter for screening mammogram for malignant neoplasm of breast: Secondary | ICD-10-CM

## 2024-05-28 ENCOUNTER — Encounter

## 2024-06-22 ENCOUNTER — Encounter
